# Patient Record
Sex: Male | Born: 1935 | Race: White | Hispanic: No | Smoking: Former smoker
Health system: Southern US, Community
[De-identification: ages and names within clinical notes are randomized; demographics above are authoritative.]

## PROBLEM LIST (undated history)

## (undated) DIAGNOSIS — I350 Nonrheumatic aortic (valve) stenosis: Secondary | ICD-10-CM

## (undated) DIAGNOSIS — I10 Essential (primary) hypertension: Secondary | ICD-10-CM

## (undated) DIAGNOSIS — Z8619 Personal history of other infectious and parasitic diseases: Secondary | ICD-10-CM

## (undated) DIAGNOSIS — G7000 Myasthenia gravis without (acute) exacerbation: Secondary | ICD-10-CM

## (undated) DIAGNOSIS — R740 Nonspecific elevation of levels of transaminase and lactic acid dehydrogenase [LDH]: Secondary | ICD-10-CM

## (undated) DIAGNOSIS — E039 Hypothyroidism, unspecified: Secondary | ICD-10-CM

## (undated) DIAGNOSIS — R7401 Elevation of levels of liver transaminase levels: Secondary | ICD-10-CM

## (undated) DIAGNOSIS — K5900 Constipation, unspecified: Secondary | ICD-10-CM

## (undated) DIAGNOSIS — C61 Malignant neoplasm of prostate: Secondary | ICD-10-CM

## (undated) HISTORY — DX: Personal history of other infectious and parasitic diseases: Z86.19

## (undated) HISTORY — DX: Constipation, unspecified: K59.00

## (undated) HISTORY — DX: Essential (primary) hypertension: I10

## (undated) HISTORY — DX: Malignant neoplasm of prostate: C61

## (undated) HISTORY — DX: Nonspecific elevation of levels of transaminase and lactic acid dehydrogenase (ldh): R74.0

## (undated) HISTORY — DX: Elevation of levels of liver transaminase levels: R74.01

---
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
## 1999-10-23 HISTORY — PX: PROSTATECTOMY: SHX69

## 2004-10-22 HISTORY — PX: HERNIA REPAIR: SHX51

## 2005-04-19 ENCOUNTER — Ambulatory Visit: Payer: Self-pay | Admitting: General Surgery

## 2005-04-19 ENCOUNTER — Other Ambulatory Visit: Payer: Self-pay

## 2005-04-26 ENCOUNTER — Ambulatory Visit: Payer: Self-pay | Admitting: General Surgery

## 2006-04-20 ENCOUNTER — Emergency Department: Payer: Self-pay | Admitting: Emergency Medicine

## 2006-05-30 ENCOUNTER — Other Ambulatory Visit: Payer: Self-pay

## 2006-05-30 ENCOUNTER — Ambulatory Visit: Payer: Self-pay | Admitting: Urology

## 2006-06-13 ENCOUNTER — Ambulatory Visit: Payer: Self-pay | Admitting: Urology

## 2006-06-20 ENCOUNTER — Ambulatory Visit: Payer: Self-pay | Admitting: Urology

## 2006-12-27 ENCOUNTER — Ambulatory Visit: Payer: Self-pay | Admitting: General Surgery

## 2006-12-27 ENCOUNTER — Other Ambulatory Visit: Payer: Self-pay

## 2006-12-27 LAB — HM COLONOSCOPY

## 2007-01-01 ENCOUNTER — Ambulatory Visit: Payer: Self-pay | Admitting: General Surgery

## 2012-08-15 DIAGNOSIS — I080 Rheumatic disorders of both mitral and aortic valves: Secondary | ICD-10-CM | POA: Insufficient documentation

## 2012-08-15 DIAGNOSIS — I10 Essential (primary) hypertension: Secondary | ICD-10-CM | POA: Insufficient documentation

## 2013-10-08 ENCOUNTER — Ambulatory Visit: Payer: Self-pay | Admitting: Family Medicine

## 2013-10-28 ENCOUNTER — Ambulatory Visit: Payer: Self-pay | Admitting: Family Medicine

## 2013-11-18 ENCOUNTER — Ambulatory Visit: Payer: Self-pay | Admitting: Internal Medicine

## 2014-05-07 LAB — BASIC METABOLIC PANEL
BUN: 27 mg/dL — AB (ref 4–21)
CREATININE: 1.8 mg/dL — AB (ref 0.6–1.3)
GLUCOSE: 87 mg/dL
POTASSIUM: 4.1 mmol/L (ref 3.4–5.3)
Sodium: 142 mmol/L (ref 137–147)

## 2014-05-07 LAB — TSH: TSH: 1.66 u[IU]/mL (ref 0.41–5.90)

## 2014-10-27 DIAGNOSIS — N183 Chronic kidney disease, stage 3 (moderate): Secondary | ICD-10-CM | POA: Diagnosis not present

## 2014-10-27 DIAGNOSIS — E039 Hypothyroidism, unspecified: Secondary | ICD-10-CM | POA: Diagnosis not present

## 2014-10-27 DIAGNOSIS — Z0000 Encounter for general adult medical examination without abnormal findings: Secondary | ICD-10-CM | POA: Diagnosis not present

## 2014-10-27 DIAGNOSIS — I10 Essential (primary) hypertension: Secondary | ICD-10-CM | POA: Diagnosis not present

## 2014-10-27 DIAGNOSIS — E785 Hyperlipidemia, unspecified: Secondary | ICD-10-CM | POA: Diagnosis not present

## 2014-10-27 LAB — BASIC METABOLIC PANEL
BUN: 29 mg/dL — AB (ref 4–21)
CREATININE: 1.8 mg/dL — AB (ref 0.6–1.3)
Creatinine: 1.8 mg/dL — AB (ref 0.6–1.3)
GLUCOSE: 89 mg/dL
POTASSIUM: 4.6 mmol/L (ref 3.4–5.3)
Sodium: 140 mmol/L (ref 137–147)

## 2014-10-27 LAB — LIPID PANEL
CHOLESTEROL: 149 mg/dL (ref 0–200)
HDL: 63 mg/dL (ref 35–70)
LDL Cholesterol: 70 mg/dL
Triglycerides: 79 mg/dL (ref 40–160)

## 2014-10-27 LAB — TSH: TSH: 2.09 u[IU]/mL (ref 0.41–5.90)

## 2014-11-02 ENCOUNTER — Ambulatory Visit: Payer: Self-pay | Admitting: Family Medicine

## 2014-11-02 DIAGNOSIS — I7789 Other specified disorders of arteries and arterioles: Secondary | ICD-10-CM | POA: Diagnosis not present

## 2014-11-02 DIAGNOSIS — I6523 Occlusion and stenosis of bilateral carotid arteries: Secondary | ICD-10-CM | POA: Diagnosis not present

## 2014-11-09 DIAGNOSIS — R740 Nonspecific elevation of levels of transaminase and lactic acid dehydrogenase [LDH]: Secondary | ICD-10-CM | POA: Diagnosis not present

## 2014-11-09 LAB — HEPATIC FUNCTION PANEL
ALT: 43 U/L — AB (ref 10–40)
AST: 31 U/L (ref 14–40)

## 2014-12-07 DIAGNOSIS — I350 Nonrheumatic aortic (valve) stenosis: Secondary | ICD-10-CM | POA: Diagnosis not present

## 2014-12-07 DIAGNOSIS — R9431 Abnormal electrocardiogram [ECG] [EKG]: Secondary | ICD-10-CM | POA: Diagnosis not present

## 2014-12-07 DIAGNOSIS — R011 Cardiac murmur, unspecified: Secondary | ICD-10-CM | POA: Diagnosis not present

## 2014-12-07 DIAGNOSIS — R0602 Shortness of breath: Secondary | ICD-10-CM | POA: Diagnosis not present

## 2014-12-14 DIAGNOSIS — R011 Cardiac murmur, unspecified: Secondary | ICD-10-CM | POA: Diagnosis not present

## 2014-12-14 DIAGNOSIS — I350 Nonrheumatic aortic (valve) stenosis: Secondary | ICD-10-CM | POA: Diagnosis not present

## 2014-12-14 DIAGNOSIS — R0602 Shortness of breath: Secondary | ICD-10-CM | POA: Diagnosis not present

## 2014-12-22 DIAGNOSIS — E785 Hyperlipidemia, unspecified: Secondary | ICD-10-CM | POA: Diagnosis not present

## 2014-12-22 DIAGNOSIS — Z1389 Encounter for screening for other disorder: Secondary | ICD-10-CM | POA: Diagnosis not present

## 2014-12-22 DIAGNOSIS — I2510 Atherosclerotic heart disease of native coronary artery without angina pectoris: Secondary | ICD-10-CM | POA: Diagnosis not present

## 2014-12-22 LAB — LIPID PANEL
CHOLESTEROL: 149 mg/dL (ref 0–200)
HDL: 62 mg/dL (ref 35–70)
LDL Cholesterol: 72 mg/dL
TRIGLYCERIDES: 75 mg/dL (ref 40–160)

## 2014-12-22 LAB — HEPATIC FUNCTION PANEL
ALT: 22 U/L (ref 10–40)
AST: 23 U/L (ref 14–40)

## 2015-01-31 DIAGNOSIS — H6123 Impacted cerumen, bilateral: Secondary | ICD-10-CM | POA: Diagnosis not present

## 2015-01-31 DIAGNOSIS — H903 Sensorineural hearing loss, bilateral: Secondary | ICD-10-CM | POA: Diagnosis not present

## 2015-08-26 ENCOUNTER — Other Ambulatory Visit: Payer: Self-pay | Admitting: Family Medicine

## 2015-10-28 ENCOUNTER — Other Ambulatory Visit: Payer: Self-pay | Admitting: Family Medicine

## 2015-10-28 NOTE — Telephone Encounter (Signed)
Patient requesting refill levothyroxine (SYNTHROID, LEVOTHROID) 50 MCG tablet   lisinopril-hydrochlorothiazide (PRINZIDE,ZESTORETIC) 10-12.5 MG tablet   [facility]

## 2015-10-30 MED ORDER — LEVOTHYROXINE SODIUM 50 MCG PO TABS: ORAL_TABLET | ORAL | Status: DC

## 2015-10-30 MED ORDER — LISINOPRIL-HYDROCHLOROTHIAZIDE 10-12.5 MG PO TABS: ORAL_TABLET | ORAL | Status: DC

## 2015-11-03 ENCOUNTER — Encounter: Payer: Self-pay | Admitting: *Deleted

## 2015-11-03 DIAGNOSIS — R0609 Other forms of dyspnea: Secondary | ICD-10-CM

## 2015-11-03 DIAGNOSIS — R7401 Elevation of levels of liver transaminase levels: Secondary | ICD-10-CM | POA: Insufficient documentation

## 2015-11-03 DIAGNOSIS — I10 Essential (primary) hypertension: Secondary | ICD-10-CM

## 2015-11-03 DIAGNOSIS — R740 Nonspecific elevation of levels of transaminase and lactic acid dehydrogenase [LDH]: Secondary | ICD-10-CM

## 2015-11-03 DIAGNOSIS — I080 Rheumatic disorders of both mitral and aortic valves: Secondary | ICD-10-CM

## 2015-11-03 DIAGNOSIS — R1031 Right lower quadrant pain: Secondary | ICD-10-CM | POA: Insufficient documentation

## 2015-11-03 DIAGNOSIS — N183 Chronic kidney disease, stage 3 unspecified: Secondary | ICD-10-CM | POA: Insufficient documentation

## 2015-11-03 DIAGNOSIS — E785 Hyperlipidemia, unspecified: Secondary | ICD-10-CM | POA: Insufficient documentation

## 2015-11-03 DIAGNOSIS — N200 Calculus of kidney: Secondary | ICD-10-CM | POA: Insufficient documentation

## 2015-11-03 DIAGNOSIS — M549 Dorsalgia, unspecified: Secondary | ICD-10-CM | POA: Insufficient documentation

## 2015-11-03 DIAGNOSIS — I2510 Atherosclerotic heart disease of native coronary artery without angina pectoris: Secondary | ICD-10-CM | POA: Insufficient documentation

## 2015-11-03 DIAGNOSIS — R0989 Other specified symptoms and signs involving the circulatory and respiratory systems: Secondary | ICD-10-CM | POA: Insufficient documentation

## 2015-11-03 DIAGNOSIS — Z8619 Personal history of other infectious and parasitic diseases: Secondary | ICD-10-CM | POA: Insufficient documentation

## 2015-11-03 DIAGNOSIS — E039 Hypothyroidism, unspecified: Secondary | ICD-10-CM

## 2015-11-03 DIAGNOSIS — Z8546 Personal history of malignant neoplasm of prostate: Secondary | ICD-10-CM | POA: Insufficient documentation

## 2015-11-04 ENCOUNTER — Encounter: Payer: Self-pay | Admitting: Family Medicine

## 2015-11-04 ENCOUNTER — Ambulatory Visit (INDEPENDENT_AMBULATORY_CARE_PROVIDER_SITE_OTHER): Payer: Medicare Other | Admitting: Family Medicine

## 2015-11-04 VITALS — BP 106/54 | HR 52 | Temp 97.5°F | Resp 16 | Ht 68.0 in | Wt 137.0 lb

## 2015-11-04 DIAGNOSIS — I10 Essential (primary) hypertension: Secondary | ICD-10-CM | POA: Diagnosis not present

## 2015-11-04 DIAGNOSIS — E039 Hypothyroidism, unspecified: Secondary | ICD-10-CM | POA: Diagnosis not present

## 2015-11-04 DIAGNOSIS — I080 Rheumatic disorders of both mitral and aortic valves: Secondary | ICD-10-CM

## 2015-11-04 DIAGNOSIS — N183 Chronic kidney disease, stage 3 unspecified: Secondary | ICD-10-CM

## 2015-11-04 DIAGNOSIS — I2510 Atherosclerotic heart disease of native coronary artery without angina pectoris: Secondary | ICD-10-CM | POA: Diagnosis not present

## 2015-11-04 DIAGNOSIS — Z8546 Personal history of malignant neoplasm of prostate: Secondary | ICD-10-CM

## 2015-11-04 DIAGNOSIS — E785 Hyperlipidemia, unspecified: Secondary | ICD-10-CM

## 2015-11-04 DIAGNOSIS — Z0000 Encounter for general adult medical examination without abnormal findings: Secondary | ICD-10-CM

## 2015-11-04 NOTE — Progress Notes (Signed)
Patient: Jason Riley, Male    DOB: [birth year], 82 y.o.   MRN: [id number] Visit Date: 11/04/2015  Today's Provider: Lelon Huh, MD   Chief Complaint  Patient presents with  . Annual Exam  . Hypertension    follow up  . Hyperlipidemia    follow up  . Hypothyroidism    follow up  . Chronic Kidney Disease    follow up   Subjective:    Annual physical  Jason Riley is a 82 y.o. male. He feels fairly well. He reports exercising several times a week walking. He reports he is sleeping fairly well.  -----------------------------------------------------------  Hypertension, follow-up:  BP Readings from Last 3 Encounters:  12/22/14 122/60    He was last seen for hypertension 1 years ago.  BP at that visit was 132/72. Management since that visit includes no changes. He reports good compliance with treatment. He is not having side effects.  He is exercising. He is not adherent to low salt diet.   Outside blood pressures are nor being checked. He is experiencing none.  Patient denies chest pain, chest pressure/discomfort, claudication, dyspnea, exertional chest pressure/discomfort, fatigue, irregular heart beat, lower extremity edema, near-syncope, orthopnea, palpitations, paroxysmal nocturnal dyspnea, syncope and tachypnea.   Cardiovascular risk factors include advanced age (older than 29 for men, 64 for women), dyslipidemia, hypertension and male gender.  Use of agents associated with hypertension: NSAIDS.     Weight trend: stable Wt Readings from Last 3 Encounters:  12/22/14 139 lb (63.05 kg)    Current diet: in general, an "unhealthy" diet  ------------------------------------------------------------------------   Lipid/Cholesterol, Follow-up:   Last seen for this 10 months ago.  Management changes since that visit include none; patient was to continue Crestor. . Last Lipid Panel:    Component Value Date/Time   CHOL 149 12/22/2014   TRIG 75 12/22/2014    HDL 62 12/22/2014   LDLCALC 72 12/22/2014    Risk factors for vascular disease include hypercholesterolemia and hypertension  He reports good compliance with treatment. He is not having side effects.  Current symptoms include none and have been stable. Weight trend: stable Prior visit with dietician: no Current diet: in general, an "unhealthy" diet Current exercise: walking  Wt Readings from Last 3 Encounters:  12/22/14 139 lb (63.05 kg)    ------------------------------------------------------------------- Follow up Hypothyroidism:  Last office visit was 1 year ago and no changes were made. Current treatment includes Levothyroxine 48mcg daily. Patient reports good compliance with treatment and good tolerance.   Follow up CKD:  Last office visit was 1 year ago and no changes were made.   Review of Systems  Constitutional: Negative for fever, chills, appetite change and fatigue.  HENT: Positive for hearing loss. Negative for congestion, ear pain, nosebleeds and trouble swallowing.   Eyes: Negative for pain and visual disturbance.  Respiratory: Negative for cough, chest tightness and shortness of breath.   Cardiovascular: Negative for chest pain, palpitations and leg swelling.  Gastrointestinal: Negative for nausea, vomiting, abdominal pain, diarrhea, constipation and blood in stool.  Endocrine: Negative for polydipsia, polyphagia and polyuria.  Genitourinary: Negative for dysuria and flank pain.  Musculoskeletal: Negative for myalgias, back pain, joint swelling, arthralgias and neck stiffness.  Skin: Negative for color change, rash and wound.  Neurological: Negative for dizziness, tremors, seizures, speech difficulty, weakness, light-headedness and headaches.  Psychiatric/Behavioral: Negative for behavioral problems, confusion, sleep disturbance, dysphoric mood and decreased concentration. The patient is not nervous/anxious.  All other systems reviewed and are  negative.   Social History   Social History  . Marital Status: Married    Spouse Name: N/A  . Number of Children: 1  . Years of Education: Some Coll   Occupational History  . Retired    Social History Main Topics  . Smoking status: Former Smoker -- 10 years    Types: Cigars  . Smokeless tobacco: Current User    Types: Chew  . Alcohol Use: No  . Drug Use: No  . Sexual Activity: Not on file   Other Topics Concern  . Not on file   Social History Narrative    Past Medical History  Diagnosis Date  . Hyperlipidemia   . Hypertension   . Prostate disease   . Kidney disease   . History of measles   . Hypothyroid   . Aortic stenosis with mitral and aortic insufficiency   . Coronary artery disease involving native heart   . Back pain   . CKD (chronic kidney disease), stage III   . DOE (dyspnea on exertion)   . Carotid bruit   . Right inguinal pain   . Elevated transaminase level      Patient Active Problem List   Diagnosis Date Noted  . Back pain 11/03/2015  . Carotid bruit 11/03/2015  . History of measles 11/03/2015  . CKD (chronic kidney disease), stage III 11/03/2015  . Coronary artery disease involving native heart 11/03/2015  . DOE (dyspnea on exertion) 11/03/2015  . Elevated transaminase level 11/03/2015  . Hyperlipidemia 11/03/2015  . Hypothyroid 11/03/2015  . Kidney stone 11/03/2015  . Prostate disease 11/03/2015  . Right inguinal pain 11/03/2015  . Aortic stenosis with mitral and aortic insufficiency 08/15/2012  . Hypertension 08/15/2012    Past Surgical History  Procedure Laterality Date  . Appendectomy  1955  . Hernia repair Right 2006    Dr. Jamal Collin  . Prostatectomy  2001    Dr. Yves Dill    His family history includes Colon cancer in his father; Heart disease in his mother; Obesity in his sister.    Previous Medications   ASPIRIN (ASPIRIN LOW DOSE) 81 MG EC TABLET    Take 1 tablet by mouth daily.   LEVOTHYROXINE (SYNTHROID, LEVOTHROID) 50 MCG  TABLET    Take 1 tablet by mouth  daily   LISINOPRIL-HYDROCHLOROTHIAZIDE (PRINZIDE,ZESTORETIC) 10-12.5 MG TABLET    Take 1 tablet by mouth  daily   ROSUVASTATIN (CRESTOR) 20 MG TABLET    Take 1 tablet by mouth daily.    Patient Care Team: [name], MD as PCP - General (Family Medicine)     Objective:   Vitals: BP 106/54 mmHg  Pulse 52  Temp(Src) 97.5 F (36.4 C) (Oral)  Resp 16  Ht 5\' 8"  (1.727 m)  Wt 137 lb (62.143 kg)  BMI 20.84 kg/m2  SpO2 99%  Physical Exam   General Appearance:    Alert, cooperative, no distress, appears stated age  Head:    Normocephalic, without obvious abnormality, atraumatic  Eyes:    PERRL, conjunctiva/corneas clear, EOM's intact, fundi    benign, both eyes       Ears:    Normal TM's and external ear canals, both ears  Nose:   Nares normal, septum midline, mucosa normal, no drainage   or sinus tenderness  Throat:   Lips, mucosa, and tongue normal; teeth and gums normal  Neck:   Supple, symmetrical, trachea midline, no adenopathy;  thyroid:  No enlargement/tenderness/nodules; no carotid   bruit or JVD  Back:     Symmetric, no curvature, ROM normal, no CVA tenderness  Lungs:     Clear to auscultation bilaterally, respirations unlabored  Chest wall:    No tenderness or deformity  Heart:    Regular rate and rhythm, S1 and S2 normal, III/VI murmur RUSB  Abdomen:     Soft, non-tender, bowel sounds active all four quadrants,    no masses, no organomegaly  Genitalia:    deferred  Rectal:    deferred  Extremities:   Extremities normal, atraumatic, no cyanosis or edema  Pulses:   2+ and symmetric all extremities  Skin:   Skin color, texture, turgor normal, no rashes or lesions  Lymph nodes:   Cervical, supraclavicular, and axillary nodes normal  Neurologic:   CNII-XII intact. Normal strength, sensation and reflexes      throughout    Activities of Daily Living In your present state of health, do you have any difficulty performing the  following activities: 11/04/2015  Hearing? Y  Vision? N  Difficulty concentrating or making decisions? N  Walking or climbing stairs? N  Dressing or bathing? N  Doing errands, shopping? N    Fall Risk Assessment Fall Risk  11/04/2015  Falls in the past year? No     Depression Screen PHQ 2/9 Scores 11/04/2015  PHQ - 2 Score 0    Cognitive Testing - 6-CIT  Correct? Score   What year is it? yes 0 0 or 4  What month is it? yes 0 0 or 3  Memorize:    Pia Mau,  44,  [address]  [place],      What time is it? (within 1 hour) yes 0 0 or 3  Count backwards from 20 yes 0 0, 2, or 4  Name the months of the year yes 0 0, 2, or 4  Repeat name & address above yes 0 0, 2, 4, 6, 8, or 10       TOTAL SCORE  0/28   Interpretation:  Normal  Normal (0-7) Abnormal (8-28)    Audit-C Alcohol Use Screening  Question Answer Points  How often do you have alcoholic drink? never 0  On days you do drink alcohol, how many drinks do you typically consume? 0 0  How oftey will you drink 6 or more in a total? never 0  Total Score:  0   A score of 3 or more in women, and 4 or more in men indicates increased risk for alcohol abuse, EXCEPT if all of the points are from question 1.    Assessment & Plan:    Annual Physical Reviewed patient's Family Medical History Reviewed and updated list of patient's medical providers Assessment of cognitive impairment was done Assessed patient's functional ability Established a written schedule for health screening [facility] Completed and Reviewed  Exercise Activities and Dietary recommendations Goals    None      Immunization History  Administered Date(s) Administered  . Influenza, High Dose Seasonal PF 08/12/2015  . Pneumococcal Conjugate-13 08/17/2013    Health Maintenance  Topic Date Due  . TETANUS/TDAP  12/27/1949  . ZOSTAVAX  12/28/1990  . PNA vac Low Risk Adult (2 of 2 - PPSV23) 08/17/2014  . INFLUENZA VACCINE   05/22/2016      Discussed health benefits of physical activity, and encouraged him to engage in regular exercise appropriate for his age and condition.    ------------------------------------------------------------------------------------------------------------  1. Annual physical exam Generally doing well.   2. Aortic stenosis with mitral and aortic insufficiency Asymptomatic. Compliant with medication.  Continue aggressive risk factor modification.    3. CKD (chronic kidney disease), stage III  - Renal function panel  4. Hypothyroidism, unspecified hypothyroidism type Doing well on current dose of levothroxine with no adverse effects. Continue current medication.   5. Essential hypertension Well controlled.  Continue current medications.   - TSH  6. Hyperlipidemia He is tolerating rosuvastatin well with no adverse effects.   - Lipid panel  7. Coronary artery disease involving native coronary artery of native heart without angina pectoris Asymptomatic. Compliant with medication.  Continue aggressive risk factor modification.

## 2015-11-05 LAB — RENAL FUNCTION PANEL
ALBUMIN: 4.2 g/dL (ref 3.5–4.7)
BUN/Creatinine Ratio: 14 (ref 10–22)
BUN: 29 mg/dL — AB (ref 8–27)
CO2: 24 mmol/L (ref 18–29)
CREATININE: 2.06 mg/dL — AB (ref 0.76–1.27)
Calcium: 9.8 mg/dL (ref 8.6–10.2)
Chloride: 105 mmol/L (ref 96–106)
GFR calc Af Amer: 33 mL/min/{1.73_m2} — ABNORMAL LOW (ref >59)
GFR, EST NON AFRICAN AMERICAN: 29 mL/min/{1.73_m2} — AB (ref >59)
Glucose: 94 mg/dL (ref 65–99)
PHOSPHORUS: 3.3 mg/dL (ref 2.5–4.5)
POTASSIUM: 4.3 mmol/L (ref 3.5–5.2)
Sodium: 144 mmol/L (ref 134–144)

## 2015-11-05 LAB — LIPID PANEL
CHOLESTEROL TOTAL: 131 mg/dL (ref 100–199)
Chol/HDL Ratio: 2.3 ratio units (ref 0.0–5.0)
HDL: 56 mg/dL (ref >39)
LDL Calculated: 62 mg/dL (ref 0–99)
TRIGLYCERIDES: 65 mg/dL (ref 0–149)
VLDL CHOLESTEROL CAL: 13 mg/dL (ref 5–40)

## 2015-11-05 LAB — TSH: TSH: 1.4 u[IU]/mL (ref 0.450–4.500)

## 2015-11-08 ENCOUNTER — Encounter: Payer: Self-pay | Admitting: Family Medicine

## 2015-12-12 DIAGNOSIS — E079 Disorder of thyroid, unspecified: Secondary | ICD-10-CM | POA: Diagnosis not present

## 2015-12-12 DIAGNOSIS — R0602 Shortness of breath: Secondary | ICD-10-CM | POA: Diagnosis not present

## 2015-12-12 DIAGNOSIS — I350 Nonrheumatic aortic (valve) stenosis: Secondary | ICD-10-CM | POA: Diagnosis not present

## 2015-12-12 DIAGNOSIS — I10 Essential (primary) hypertension: Secondary | ICD-10-CM | POA: Diagnosis not present

## 2015-12-12 DIAGNOSIS — E785 Hyperlipidemia, unspecified: Secondary | ICD-10-CM | POA: Diagnosis not present

## 2015-12-18 ENCOUNTER — Other Ambulatory Visit: Payer: Self-pay | Admitting: Family Medicine

## 2016-01-02 DIAGNOSIS — H903 Sensorineural hearing loss, bilateral: Secondary | ICD-10-CM | POA: Diagnosis not present

## 2016-01-02 DIAGNOSIS — H6123 Impacted cerumen, bilateral: Secondary | ICD-10-CM | POA: Diagnosis not present

## 2016-02-21 ENCOUNTER — Other Ambulatory Visit: Payer: Self-pay | Admitting: Family Medicine

## 2016-06-11 DIAGNOSIS — I10 Essential (primary) hypertension: Secondary | ICD-10-CM | POA: Diagnosis not present

## 2016-06-11 DIAGNOSIS — R0602 Shortness of breath: Secondary | ICD-10-CM | POA: Diagnosis not present

## 2016-06-11 DIAGNOSIS — E785 Hyperlipidemia, unspecified: Secondary | ICD-10-CM | POA: Diagnosis not present

## 2016-06-11 DIAGNOSIS — E079 Disorder of thyroid, unspecified: Secondary | ICD-10-CM | POA: Diagnosis not present

## 2016-06-11 DIAGNOSIS — I350 Nonrheumatic aortic (valve) stenosis: Secondary | ICD-10-CM | POA: Diagnosis not present

## 2016-07-04 DIAGNOSIS — H903 Sensorineural hearing loss, bilateral: Secondary | ICD-10-CM | POA: Diagnosis not present

## 2016-07-04 DIAGNOSIS — H6123 Impacted cerumen, bilateral: Secondary | ICD-10-CM | POA: Diagnosis not present

## 2016-11-06 ENCOUNTER — Ambulatory Visit (INDEPENDENT_AMBULATORY_CARE_PROVIDER_SITE_OTHER): Payer: Medicare Other | Admitting: Family Medicine

## 2016-11-06 ENCOUNTER — Ambulatory Visit (INDEPENDENT_AMBULATORY_CARE_PROVIDER_SITE_OTHER): Payer: Medicare Other

## 2016-11-06 VITALS — BP 129/63 | HR 68 | Temp 97.8°F | Ht 68.0 in | Wt 145.0 lb

## 2016-11-06 DIAGNOSIS — N183 Chronic kidney disease, stage 3 unspecified: Secondary | ICD-10-CM

## 2016-11-06 DIAGNOSIS — I2510 Atherosclerotic heart disease of native coronary artery without angina pectoris: Secondary | ICD-10-CM | POA: Diagnosis not present

## 2016-11-06 DIAGNOSIS — Z0000 Encounter for general adult medical examination without abnormal findings: Secondary | ICD-10-CM

## 2016-11-06 DIAGNOSIS — L989 Disorder of the skin and subcutaneous tissue, unspecified: Secondary | ICD-10-CM | POA: Diagnosis not present

## 2016-11-06 DIAGNOSIS — E785 Hyperlipidemia, unspecified: Secondary | ICD-10-CM

## 2016-11-06 DIAGNOSIS — E039 Hypothyroidism, unspecified: Secondary | ICD-10-CM | POA: Diagnosis not present

## 2016-11-06 DIAGNOSIS — I10 Essential (primary) hypertension: Secondary | ICD-10-CM

## 2016-11-06 NOTE — Progress Notes (Signed)
Patient: Jason Riley Male    DOB: Oct 25, 1928   83 y.o.   MRN: [id number] Visit Date: 11/06/2016  Today's Provider: Lelon Huh, MD   Chief Complaint  Patient presents with  . Follow-up  . Hypertension  . Hypothyroidism  . Hyperlipidemia  . Chronic Kidney Disease   Subjective:    HPI He is here for follow up of multiple chronic conditions. Had ASV with nurse health advisor earlier today.   CKD (chronic kidney disease), stage III From 11/04/2015-no changes.  Hypothyroidism, unspecified hypothyroidism type From 11/04/2015-no changes. Lab Results  Component Value Date   TSH 1.400 11/04/2015       Hypertension, follow-up:  BP Readings from Last 3 Encounters:  11/06/16 129/63  11/04/15 (!) 106/54  12/22/14 122/60    He was last seen for hypertension 1 years ago.  BP at that visit was 106/54. Management since that visit includes; no changes.He reports good compliance with treatment. He is not having side effects. none He is not exercising. He is adherent to low salt diet.   Outside blood pressures are not checking. He is experiencing none.  Patient denies none.   Cardiovascular risk factors include none.  Use of agents associated with hypertension: none.   ----------------------------------------------------------------    Lipid/Cholesterol, Follow-up:   Last seen for this 1 years ago.  Management since that visit includes; no changes.  Last Lipid Panel:    Component Value Date/Time   CHOL 131 11/04/2015 1055   TRIG 65 11/04/2015 1055   HDL 56 11/04/2015 1055   CHOLHDL 2.3 11/04/2015 1055   LDLCALC 62 11/04/2015 1055    He reports good compliance with treatment. He is not having side effects. none  Wt Readings from Last 3 Encounters:  11/06/16 145 lb (65.8 kg)  11/04/15 137 lb (62.1 kg)  12/22/14 139 lb (63 kg)    ---------------------------------------------------------------- CAD: Continues regular follow up with Dr. Clayborn Bigness. No  recent chest pains, dyspnea, or palpitations.    Allergies  Allergen Reactions  . Atorvastatin     Elevated liver functions  . Sulfa Antibiotics      Current Outpatient Prescriptions:  .  aspirin (ASPIRIN LOW DOSE) 81 MG EC tablet, Take 1 tablet by mouth daily., Disp: , Rfl:  .  docusate sodium (STOOL SOFTENER) 100 MG capsule, Take 100 mg by mouth daily., Disp: , Rfl:  .  levothyroxine (SYNTHROID, LEVOTHROID) 50 MCG tablet, Take 1 tablet by mouth  daily, Disp: 90 tablet, Rfl: 3 .  lisinopril-hydrochlorothiazide (PRINZIDE,ZESTORETIC) 10-12.5 MG tablet, Take 1 tablet by mouth  daily, Disp: 90 tablet, Rfl: 3 .  rosuvastatin (CRESTOR) 20 MG tablet, Take 1 tablet by mouth  daily, Disp: 90 tablet, Rfl: 4  Review of Systems  Constitutional: Negative for appetite change, chills and fever.  Respiratory: Negative for chest tightness, shortness of breath and wheezing.   Cardiovascular: Negative for chest pain and palpitations.  Gastrointestinal: Negative for abdominal pain, nausea and vomiting.    Social History  Substance Use Topics  . Smoking status: Former Smoker    Years: 10.00    Types: Cigars  . Smokeless tobacco: Current User    Types: Chew  . Alcohol use No   Objective:    Vitals: BP 129/63 (BP Location: Right Arm)   Pulse 68   Temp 97.8 F (36.6 C) (Oral)   Ht 5\' 8"  (1.727 m)   Wt 145 lb (65.8 kg)   BMI 22.05 kg/m   Body  mass index is 22.05 kg/m.  Physical Exam   General Appearance:    Alert, cooperative, no distress  Eyes:    PERRL, conjunctiva/corneas clear, EOM's intact       Lungs:     Clear to auscultation bilaterally, respirations unlabored  Heart:    Regular rate and rhythm  Neurologic:   Awake, alert, oriented x 3. No apparent focal neurological           defect.   Skin:   Scattered hyperkeratotic lesions across both forearms. Several scabby pigmented lesions across back c/w SKs.        Assessment & Plan:      1. Coronary artery disease involving  native coronary artery of native heart without angina pectoris Asymptomatic. Compliant with medication.  Continue aggressive risk factor modification.    2. Essential hypertension Well controlled.  Continue current medications.   - Comprehensive metabolic panel  3. Hypothyroidism, unspecified type  - TSH  4. CKD (chronic kidney disease), stage III  - Comprehensive metabolic panel  5. Hyperlipidemia, unspecified hyperlipidemia type He is tolerating rosuvastatin well with no adverse effects.   - Lipid panel - Comprehensive metabolic panel  6. Skin lesions  - Ambulatory referral to Dermatology       Lelon Huh, MD  [facility]

## 2016-11-06 NOTE — Patient Instructions (Signed)

## 2016-11-06 NOTE — Progress Notes (Signed)
Subjective:   Jason Riley is a 83 y.o. male who presents for Medicare Annual/Subsequent preventive examination.  Review of Systems:  N/A  Cardiac Risk Factors include: advanced age (>17men, >71 women);dyslipidemia;hypertension;male gender;smoking/ tobacco exposure     Objective:    Vitals: BP 129/63 (BP Location: Right Arm)   Pulse 68   Temp 97.8 F (36.6 C) (Oral)   Ht 5\' 8"  (1.727 m)   Wt 145 lb (65.8 kg)   BMI 22.05 kg/m   Body mass index is 22.05 kg/m.  Tobacco History  Smoking Status  . Former Smoker  . Years: 10.00  . Types: Cigars  Smokeless Tobacco  . Current User  . Types: Chew     Ready to quit: Not Answered Counseling given: Not Answered   Past Medical History:  Diagnosis Date  . CN (constipation)   . Elevated transaminase level   . History of measles   . Hyperlipidemia   . Hypertension   . Prostate cancer [facility])    Prostatectomy 2001   Past Surgical History:  Procedure Laterality Date  . APPENDECTOMY  1955  . HERNIA REPAIR Right 2006   Dr. Jamal Collin  . PROSTATECTOMY  2001   Dr. Yves Dill   Family History  Problem Relation Age of Onset  . Heart disease Mother   . Colon cancer Father   . Obesity Sister    History  Sexual Activity  . Sexual activity: Not on file    Outpatient Encounter Prescriptions as of 11/06/2016  Medication Sig  . aspirin (ASPIRIN LOW DOSE) 81 MG EC tablet Take 1 tablet by mouth daily.  Marland Kitchen docusate sodium (STOOL SOFTENER) 100 MG capsule Take 100 mg by mouth daily.  Marland Kitchen levothyroxine (SYNTHROID, LEVOTHROID) 50 MCG tablet Take 1 tablet by mouth  daily  . lisinopril-hydrochlorothiazide (PRINZIDE,ZESTORETIC) 10-12.5 MG tablet Take 1 tablet by mouth  daily  . rosuvastatin (CRESTOR) 20 MG tablet Take 1 tablet by mouth  daily   No facility-administered encounter medications on file as of 11/06/2016.     Activities of Daily Living In your present state of health, do you have any difficulty performing the following activities:  11/06/2016  Hearing? Y  Vision? N  Difficulty concentrating or making decisions? N  Walking or climbing stairs? Y  Dressing or bathing? N  Doing errands, shopping? N  Preparing Food and eating ? N  Using the Toilet? N  In the past six months, have you accidently leaked urine? Y  Do you have problems with loss of bowel control? N  Managing your Medications? N  Managing your Finances? N  Housekeeping or managing your Housekeeping? N  Some recent data might be hidden    Patient Care Team: [name], MD as PCP - General (Family Medicine) Yolonda Kida, MD as Consulting Physician (Cardiology)   Assessment:     Exercise Activities and Dietary recommendations Current Exercise Habits: The patient does not participate in regular exercise at present (only yardwork), Exercise limited by: None identified  Goals    . Exercise 150 minutes per week (moderate activity)          Starting 11/06/16, I will start walking 3 days a week for at least 20 minutes.       Fall Risk Fall Risk  11/06/2016 11/04/2015  Falls in the past year? No No   Depression Screen PHQ 2/9 Scores 11/06/2016 11/04/2015  PHQ - 2 Score 1 0    Cognitive Function     6CIT  Screen 11/06/2016  What Year? 0 points  What month? 0 points  What time? 0 points  Count back from 20 0 points  Months in reverse 2 points  Repeat phrase 0 points  Total Score 2    Immunization History  Administered Date(s) Administered  . Influenza, High Dose Seasonal PF 08/12/2015  . Pneumococcal Conjugate-13 08/17/2013   Screening Tests Health Maintenance  Topic Date Due  . TETANUS/TDAP  12/27/1949  . PNA vac Low Risk Adult (2 of 2 - PPSV23) 10/22/2017 (Originally 08/17/2014)  . ZOSTAVAX  10/22/2026 (Originally 12/28/1990)  . INFLUENZA VACCINE  Completed      Plan:  I have personally reviewed and addressed the Medicare Annual Wellness questionnaire and have noted the following in the patient's chart:  A. Medical and social  history B. Use of alcohol, tobacco or illicit drugs  C. Current medications and supplements D. Functional ability and status E.  Nutritional status F.  Physical activity G. Advance directives H. List of other physicians I.  Hospitalizations, surgeries, and ER visits in previous 12 months J.  [facility] such as hearing and vision if needed, cognitive and depression L. Referrals and appointments - none  In addition, I have reviewed and discussed with patient certain preventive protocols, quality metrics, and best practice recommendations. A written personalized care plan for preventive services as well as general preventive health recommendations were provided to patient.  See attached scanned questionnaire for additional information.   Signed,  Fabio Neighbors, LPN Nurse Health Advisor   MD Recommendations: None.  I have reviewed the health advisor's note, was available for consultation, and agree with documentation and plan  Lelon Huh, MD

## 2016-11-07 LAB — COMPREHENSIVE METABOLIC PANEL
ALK PHOS: 72 IU/L (ref 39–117)
ALT: 16 IU/L (ref 0–44)
AST: 21 IU/L (ref 0–40)
Albumin/Globulin Ratio: 1.8 (ref 1.2–2.2)
Albumin: 4.6 g/dL (ref 3.5–4.7)
BUN/Creatinine Ratio: 14 (ref 10–24)
BUN: 26 mg/dL (ref 8–27)
Bilirubin Total: 0.4 mg/dL (ref 0.0–1.2)
CHLORIDE: 104 mmol/L (ref 96–106)
CO2: 23 mmol/L (ref 18–29)
CREATININE: 1.86 mg/dL — AB (ref 0.76–1.27)
Calcium: 9.8 mg/dL (ref 8.6–10.2)
GFR calc Af Amer: 37 mL/min/{1.73_m2} — ABNORMAL LOW (ref >59)
GFR calc non Af Amer: 32 mL/min/{1.73_m2} — ABNORMAL LOW (ref >59)
GLUCOSE: 94 mg/dL (ref 65–99)
Globulin, Total: 2.6 g/dL (ref 1.5–4.5)
Potassium: 4.6 mmol/L (ref 3.5–5.2)
SODIUM: 143 mmol/L (ref 134–144)
Total Protein: 7.2 g/dL (ref 6.0–8.5)

## 2016-11-07 LAB — LIPID PANEL
CHOLESTEROL TOTAL: 136 mg/dL (ref 100–199)
Chol/HDL Ratio: 2.6 ratio units (ref 0.0–5.0)
HDL: 53 mg/dL (ref >39)
LDL CALC: 60 mg/dL (ref 0–99)
Triglycerides: 116 mg/dL (ref 0–149)
VLDL CHOLESTEROL CAL: 23 mg/dL (ref 5–40)

## 2016-11-07 LAB — TSH: TSH: 2.46 u[IU]/mL (ref 0.450–4.500)

## 2016-11-09 ENCOUNTER — Telehealth: Payer: Self-pay

## 2016-11-09 NOTE — Telephone Encounter (Signed)
-----   Message from [name], MD sent at 11/07/2016  8:24 PM EST ----- Labs are good. Cholesterol well controlled at 136. Continue current medications.  Check labs yearly.

## 2016-11-09 NOTE — Telephone Encounter (Signed)
Left message to call back  

## 2016-11-15 NOTE — Telephone Encounter (Signed)
Patient was notified of results. Expressed understanding.  

## 2016-12-04 DIAGNOSIS — R0602 Shortness of breath: Secondary | ICD-10-CM | POA: Diagnosis not present

## 2016-12-04 DIAGNOSIS — E079 Disorder of thyroid, unspecified: Secondary | ICD-10-CM | POA: Diagnosis not present

## 2016-12-04 DIAGNOSIS — I350 Nonrheumatic aortic (valve) stenosis: Secondary | ICD-10-CM | POA: Diagnosis not present

## 2016-12-04 DIAGNOSIS — R011 Cardiac murmur, unspecified: Secondary | ICD-10-CM | POA: Diagnosis not present

## 2016-12-04 DIAGNOSIS — I10 Essential (primary) hypertension: Secondary | ICD-10-CM | POA: Diagnosis not present

## 2016-12-05 DIAGNOSIS — H903 Sensorineural hearing loss, bilateral: Secondary | ICD-10-CM | POA: Diagnosis not present

## 2016-12-05 DIAGNOSIS — H6123 Impacted cerumen, bilateral: Secondary | ICD-10-CM | POA: Diagnosis not present

## 2016-12-20 DIAGNOSIS — I350 Nonrheumatic aortic (valve) stenosis: Secondary | ICD-10-CM | POA: Diagnosis not present

## 2016-12-20 DIAGNOSIS — R011 Cardiac murmur, unspecified: Secondary | ICD-10-CM | POA: Diagnosis not present

## 2017-03-11 ENCOUNTER — Other Ambulatory Visit: Payer: Self-pay | Admitting: Family Medicine

## 2017-03-11 MED ORDER — LEVOTHYROXINE SODIUM 50 MCG PO TABS: 50.0000 ug | ORAL_TABLET | Freq: Every day | ORAL | 3 refills | Status: DC

## 2017-03-11 MED ORDER — ROSUVASTATIN CALCIUM 20 MG PO TABS: 20.0000 mg | ORAL_TABLET | Freq: Every day | ORAL | 3 refills | Status: DC

## 2017-03-11 MED ORDER — LISINOPRIL-HYDROCHLOROTHIAZIDE 10-12.5 MG PO TABS: 1.0000 | ORAL_TABLET | Freq: Every day | ORAL | 3 refills | Status: DC

## 2017-03-11 NOTE — Telephone Encounter (Signed)
Pt needs refill on   levothyroxine (SYNTHROID, LEVOTHROID) 50 MCG tablet  rosuvastatin (CRESTOR) 20 MG tablet  lisinopril-hydrochlorothiazide (PRINZIDE,ZESTORETIC) 10-12.5 MG tablet  [facility]  Thanks [employer]

## 2017-03-11 NOTE — Telephone Encounter (Signed)
LOV 11/06/2016. Renaldo Fiddler, CMA

## 2017-05-30 DIAGNOSIS — H6123 Impacted cerumen, bilateral: Secondary | ICD-10-CM | POA: Diagnosis not present

## 2017-11-12 ENCOUNTER — Ambulatory Visit (INDEPENDENT_AMBULATORY_CARE_PROVIDER_SITE_OTHER): Payer: Medicare Other

## 2017-11-12 VITALS — BP 114/50 | HR 68 | Temp 97.5°F | Ht 68.0 in | Wt 142.6 lb

## 2017-11-12 DIAGNOSIS — Z0000 Encounter for general adult medical examination without abnormal findings: Secondary | ICD-10-CM | POA: Diagnosis not present

## 2017-11-12 NOTE — Patient Instructions (Signed)
Jason Riley , Thank you for taking time to come for your Medicare Wellness Visit. I appreciate your ongoing commitment to your health goals. Please review the following plan we discussed and let me know if I can assist you in the future.   Screening recommendations/referrals: Colonoscopy: Up to date Recommended yearly ophthalmology/optometry visit for glaucoma screening and checkup Recommended yearly dental visit for hygiene and checkup  Vaccinations: Influenza vaccine: Up to date Pneumococcal vaccine: Prevnar completed 07/2013. Pt states he has had the Pneumovax at his pharmacy. Will contact to receive records.  Tdap vaccine: Pt declines today.  Shingles vaccine: Pt declines today.     Advanced directives: Please bring a copy of your POA (Power of Attorney) and/or Living Will to your next appointment.   Conditions/risks identified: Recommend to quit chewing tobacco. Pt plans to taper down on amount chewed, until completely quits.   Next appointment: 11/20/17 @ 2:00 PM  Preventive Care 65 Years and Older, Male Preventive care refers to lifestyle choices and visits with your health care provider that can promote health and wellness. What does preventive care include?  A yearly physical exam. This is also called an annual well check.  Dental exams once or twice a year.  Routine eye exams. Ask your health care provider how often you should have your eyes checked.  Personal lifestyle choices, including:  Daily care of your teeth and gums.  Regular physical activity.  Eating a healthy diet.  Avoiding tobacco and drug use.  Limiting alcohol use.  Practicing safe sex.  Taking low doses of aspirin every day.  Taking vitamin and mineral supplements as recommended by your health care provider. What happens during an annual well check? The services and screenings done by your health care provider during your annual well check will depend on your age, overall health, lifestyle risk  factors, and family history of disease. Counseling  Your health care provider may ask you questions about your:  Alcohol use.  Tobacco use.  Drug use.  Emotional well-being.  Home and relationship well-being.  Sexual activity.  Eating habits.  History of falls.  Memory and ability to understand (cognition).  Work and work Statistician. Screening  You may have the following tests or measurements:  Height, weight, and BMI.  Blood pressure.  Lipid and cholesterol levels. These may be checked every 5 years, or more frequently if you are over 61 years old.  Skin check.  Lung cancer screening. You may have this screening every year starting at age 21 if you have a 30-pack-year history of smoking and currently smoke or have quit within the past 15 years.  Fecal occult blood test (FOBT) of the stool. You may have this test every year starting at age 39.  Flexible sigmoidoscopy or colonoscopy. You may have a sigmoidoscopy every 5 years or a colonoscopy every 10 years starting at age 73.  Prostate cancer screening. Recommendations will vary depending on your family history and other risks.  Hepatitis C blood test.  Hepatitis B blood test.  Sexually transmitted disease (STD) testing.  Diabetes screening. This is done by checking your blood sugar (glucose) after you have not eaten for a while (fasting). You may have this done every 1-3 years.  Abdominal aortic aneurysm (AAA) screening. You may need this if you are a current or former smoker.  Osteoporosis. You may be screened starting at age 6 if you are at high risk. Talk with your health care provider about your test results, treatment options,  and if necessary, the need for more tests. Vaccines  Your health care provider may recommend certain vaccines, such as:  Influenza vaccine. This is recommended every year.  Tetanus, diphtheria, and acellular pertussis (Tdap, Td) vaccine. You may need a Td booster every 10  years.  Zoster vaccine. You may need this after age 14.  Pneumococcal 13-valent conjugate (PCV13) vaccine. One dose is recommended after age 1.  Pneumococcal polysaccharide (PPSV23) vaccine. One dose is recommended after age 46. Talk to your health care provider about which screenings and vaccines you need and how often you need them. This information is not intended to replace advice given to you by your health care provider. Make sure you discuss any questions you have with your health care provider. Document Released: 11/04/2015 Document Revised: 06/27/2016 Document Reviewed: 08/09/2015 Elsevier Interactive Patient Education  2017 [place] Prevention in the Home Falls can cause injuries. They can happen to people of all ages. There are many things you can do to make your home safe and to help prevent falls. What can I do on the outside of my home?  Regularly fix the edges of walkways and driveways and fix any cracks.  Remove anything that might make you trip as you walk through a door, such as a raised step or threshold.  Trim any bushes or trees on the path to your home.  Use bright outdoor lighting.  Clear any walking paths of anything that might make someone trip, such as rocks or tools.  Regularly check to see if handrails are loose or broken. Make sure that both sides of any steps have handrails.  Any raised decks and porches should have guardrails on the edges.  Have any leaves, snow, or ice cleared regularly.  Use sand or salt on walking paths during winter.  Clean up any spills in your garage right away. This includes oil or grease spills. What can I do in the bathroom?  Use night lights.  Install grab bars by the toilet and in the tub and shower. Do not use towel bars as grab bars.  Use non-skid mats or decals in the tub or shower.  If you need to sit down in the shower, use a plastic, non-slip stool.  Keep the floor dry. Clean up any water that  spills on the floor as soon as it happens.  Remove soap buildup in the tub or shower regularly.  Attach bath mats securely with double-sided non-slip rug tape.  Do not have throw rugs and other things on the floor that can make you trip. What can I do in the bedroom?  Use night lights.  Make sure that you have a light by your bed that is easy to reach.  Do not use any sheets or blankets that are too big for your bed. They should not hang down onto the floor.  Have a firm chair that has side arms. You can use this for support while you get dressed.  Do not have throw rugs and other things on the floor that can make you trip. What can I do in the kitchen?  Clean up any spills right away.  Avoid walking on wet floors.  Keep items that you use a lot in easy-to-reach places.  If you need to reach something above you, use a strong step stool that has a grab bar.  Keep electrical cords out of the way.  Do not use floor polish or wax that makes floors slippery. If  you must use wax, use non-skid floor wax.  Do not have throw rugs and other things on the floor that can make you trip. What can I do with my stairs?  Do not leave any items on the stairs.  Make sure that there are handrails on both sides of the stairs and use them. Fix handrails that are broken or loose. Make sure that handrails are as long as the stairways.  Check any carpeting to make sure that it is firmly attached to the stairs. Fix any carpet that is loose or worn.  Avoid having throw rugs at the top or bottom of the stairs. If you do have throw rugs, attach them to the floor with carpet tape.  Make sure that you have a light switch at the top of the stairs and the bottom of the stairs. If you do not have them, ask someone to add them for you. What else can I do to help prevent falls?  Wear shoes that:  Do not have high heels.  Have rubber bottoms.  Are comfortable and fit you well.  Are closed at the  toe. Do not wear sandals.  If you use a stepladder:  Make sure that it is fully opened. Do not climb a closed stepladder.  Make sure that both sides of the stepladder are locked into place.  Ask someone to hold it for you, if possible.  Clearly mark and make sure that you can see:  Any grab bars or handrails.  First and last steps.  Where the edge of each step is.  Use tools that help you move around (mobility aids) if they are needed. These include:  Canes.  Walkers.  Scooters.  Crutches.  Turn on the lights when you go into a dark area. Replace any light bulbs as soon as they burn out.  Set up your furniture so you have a clear path. Avoid moving your furniture around.  If any of your floors are uneven, fix them.  If there are any pets around you, be aware of where they are.  Review your medicines with your doctor. Some medicines can make you feel dizzy. This can increase your chance of falling. Ask your doctor what other things that you can do to help prevent falls. This information is not intended to replace advice given to you by your health care provider. Make sure you discuss any questions you have with your health care provider. Document Released: 08/04/2009 Document Revised: 03/15/2016 Document Reviewed: 11/12/2014 Elsevier Interactive Patient Education  2017 [employer].

## 2017-11-12 NOTE — Progress Notes (Signed)
Subjective:   Jason Riley is a 84 y.o. male who presents for Medicare Annual/Subsequent preventive examination.  Review of Systems:  N/A  Cardiac Risk Factors include: advanced age (>84men, >89 women);dyslipidemia;hypertension;male gender;smoking/ tobacco exposure     Objective:    Vitals: BP (!) 114/50   Pulse 68   Temp (!) 97.5 F (36.4 C) (Oral)   Ht 5\' 8"  (1.727 m)   Wt 142 lb 9.6 oz (64.7 kg)   BMI 21.68 kg/m   Body mass index is 21.68 kg/m.  Advanced Directives 11/12/2017 11/06/2016  Does Patient Have a Medical Advance Directive? Yes No;Yes  Type of Paramedic of [place];Living will Jason Riley;Living will  Copy of Jason Riley in Chart? No - copy requested No - copy requested    Tobacco Social History   Tobacco Use  Smoking Status Former Smoker  . Years: 10.00  . Types: Cigars  Smokeless Tobacco Current User  . Types: Chew  Tobacco Comment   quit over 30 years ago     Ready to quit: Not Answered Counseling given: Not Answered Comment: quit over 30 years ago   Clinical Intake:  Pre-visit preparation completed: Yes  Pain : No/denies pain Pain Score: 0-No pain     Nutritional Status: BMI of 19-24  Normal Nutritional Risks: None Diabetes: No  How often do you need to have someone help you when you read instructions, pamphlets, or other written materials from your doctor or pharmacy?: 1 - Never  Interpreter Needed?: No  Information entered by :: [facility], LPN  Past Medical History:  Diagnosis Date  . CN (constipation)   . Elevated transaminase level   . History of measles   . Hyperlipidemia   . Hypertension   . Prostate cancer [facility])    Prostatectomy 2001   Past Surgical History:  Procedure Laterality Date  . APPENDECTOMY  1955  . HERNIA REPAIR Right 2006   Dr. Jamal Collin  . PROSTATECTOMY  2001   Dr. Yves Dill   Family History  Problem Relation Age of Onset  . Heart disease Mother    . Colon cancer Father   . Obesity Sister    Social History   Socioeconomic History  . Marital status: Widowed    Spouse name: None  . Number of children: 1  . Years of education: Some Coll  . Highest education level: Some college, no degree  Social Needs  . Financial resource strain: Not hard at all  . Food insecurity - worry: Never true  . Food insecurity - inability: Never true  . Transportation needs - medical: No  . Transportation needs - non-medical: No  Occupational History  . Occupation: Retired  Tobacco Use  . Smoking status: Former Smoker    Years: 10.00    Types: Cigars  . Smokeless tobacco: Current User    Types: Chew  . Tobacco comment: quit over 30 years ago  Substance and Sexual Activity  . Alcohol use: No    Alcohol/week: 0.0 oz  . Drug use: No  . Sexual activity: None  Other Topics Concern  . None  Social History Narrative  . None    Outpatient Encounter Medications as of 11/12/2017  Medication Sig  . aspirin (ASPIRIN LOW DOSE) 81 MG EC tablet Take 1 tablet by mouth daily.  Marland Kitchen docusate sodium (STOOL SOFTENER) 100 MG capsule Take 100 mg by mouth daily.  Marland Kitchen levothyroxine (SYNTHROID, LEVOTHROID) 50 MCG tablet Take 1 tablet (50  mcg total) by mouth daily.  Marland Kitchen lisinopril-hydrochlorothiazide (PRINZIDE,ZESTORETIC) 10-12.5 MG tablet Take 1 tablet by mouth daily.  . rosuvastatin (CRESTOR) 20 MG tablet Take 1 tablet (20 mg total) by mouth daily.   No facility-administered encounter medications on file as of 11/12/2017.     Activities of Daily Living In your present state of health, do you have any difficulty performing the following activities: 11/12/2017  Hearing? Y  Comment Wears bilateral hearing aids.   Vision? N  Difficulty concentrating or making decisions? N  Walking or climbing stairs? N  Dressing or bathing? N  Doing errands, shopping? N  Preparing Food and eating ? N  Using the Toilet? N  In the past six months, have you accidently leaked urine?  N  Do you have problems with loss of bowel control? N  Managing your Medications? N  Managing your Finances? N  Housekeeping or managing your Housekeeping? N  Some recent data might be hidden    Patient Care Team: [name], MD as PCP - General (Family Medicine) Yolonda Kida, MD as Consulting Physician (Cardiology) Eulogio Bear, MD as Consulting Physician (Ophthalmology)   Assessment:   This is a routine wellness examination for Jason Riley.  Exercise Activities and Dietary recommendations Current Exercise Habits: The patient does not participate in regular exercise at present, Exercise limited by: None identified  Goals    . Quit Chewing     Recommend to quit chewing tobacco. Pt plans to taper down on amount chewed, until completely quits.        Fall Risk Fall Risk  11/12/2017 11/06/2016 11/04/2015  Falls in the past year? No No No   Is the patient's home free of loose throw rugs in walkways, pet beds, electrical cords, etc?   yes      Grab bars in the bathroom? yes      Handrails on the stairs?  n/a      Adequate lighting?   yes  Timed Get Up and Go Performed: N/A  Depression Screen PHQ 2/9 Scores 11/12/2017 11/06/2016 11/04/2015  PHQ - 2 Score 0 1 0    Cognitive Function: Pt decline screening today.     6CIT Screen 11/06/2016  What Year? 0 points  What month? 0 points  What time? 0 points  Count back from 20 0 points  Months in reverse 2 points  Repeat phrase 0 points  Total Score 2    Immunization History  Administered Date(s) Administered  . Influenza, High Dose Seasonal PF 08/12/2015  . Influenza-Unspecified 08/23/2017  . Pneumococcal Conjugate-13 08/17/2013    Qualifies for Shingles Vaccine? Due for Shingles vaccine. Declined my offer to administer today. Education has been provided regarding the importance of this vaccine. Pt has been advised to call her insurance company to determine her out of pocket expense. Advised she may also receive  this vaccine at her local pharmacy or Health Dept. Verbalized acceptance and understanding.  Screening Tests Health Maintenance  Topic Date Due  . TETANUS/TDAP  12/27/1947  . PNA vac Low Risk Adult (2 of 2 - PPSV23) 08/17/2014  . INFLUENZA VACCINE  Completed   Cancer Screenings: Lung: Low Dose CT Chest recommended if Age 71-80 years, 30 pack-year currently smoking OR have quit w/in 15years. Patient does not qualify. Colorectal: Up to date and no longer required  Additional Screenings:  Hepatitis B/HIV/Syphillis: Pt declines today.  Hepatitis C Screening: Pt declines today.     Plan:  I have personally reviewed and  addressed the Medicare Annual Wellness questionnaire and have noted the following in the patient's chart:  A. Medical and social history B. Use of alcohol, tobacco or illicit drugs  C. Current medications and supplements D. Functional ability and status E.  Nutritional status F.  Physical activity G. Advance directives H. List of other physicians I.  Hospitalizations, surgeries, and ER visits in previous 12 months J.  [place] such as hearing and vision if needed, cognitive and depression L. Referrals and appointments - none  In addition, I have reviewed and discussed with patient certain preventive protocols, quality metrics, and best practice recommendations. A written personalized care plan for preventive services as well as general preventive health recommendations were provided to patient.  See attached scanned questionnaire for additional information.   Signed,  Fabio Neighbors, LPN Nurse Health Advisor   Nurse Recommendations: Pt declined the tetanus vaccine today. Will contact pharmacy to update Pneumovax admin date.

## 2017-11-12 NOTE — Telephone Encounter (Signed)
This encounter was created in error - please disregard.

## 2017-11-19 DIAGNOSIS — H903 Sensorineural hearing loss, bilateral: Secondary | ICD-10-CM | POA: Diagnosis not present

## 2017-11-19 DIAGNOSIS — H6123 Impacted cerumen, bilateral: Secondary | ICD-10-CM | POA: Diagnosis not present

## 2017-11-20 ENCOUNTER — Encounter: Payer: Medicare Other | Admitting: Family Medicine

## 2017-11-20 DIAGNOSIS — H35372 Puckering of macula, left eye: Secondary | ICD-10-CM | POA: Diagnosis not present

## 2017-11-25 ENCOUNTER — Encounter: Payer: Self-pay | Admitting: Family Medicine

## 2017-11-25 ENCOUNTER — Ambulatory Visit (INDEPENDENT_AMBULATORY_CARE_PROVIDER_SITE_OTHER): Payer: Medicare Other | Admitting: Family Medicine

## 2017-11-25 VITALS — BP 130/68 | HR 64 | Temp 97.9°F | Resp 16 | Ht 68.0 in | Wt 147.0 lb

## 2017-11-25 DIAGNOSIS — E039 Hypothyroidism, unspecified: Secondary | ICD-10-CM | POA: Diagnosis not present

## 2017-11-25 DIAGNOSIS — Z0000 Encounter for general adult medical examination without abnormal findings: Secondary | ICD-10-CM

## 2017-11-25 DIAGNOSIS — N183 Chronic kidney disease, stage 3 unspecified: Secondary | ICD-10-CM

## 2017-11-25 DIAGNOSIS — I10 Essential (primary) hypertension: Secondary | ICD-10-CM

## 2017-11-25 DIAGNOSIS — Z0001 Encounter for general adult medical examination with abnormal findings: Secondary | ICD-10-CM | POA: Diagnosis not present

## 2017-11-25 DIAGNOSIS — E785 Hyperlipidemia, unspecified: Secondary | ICD-10-CM | POA: Diagnosis not present

## 2017-11-25 DIAGNOSIS — H9193 Unspecified hearing loss, bilateral: Secondary | ICD-10-CM | POA: Diagnosis not present

## 2017-11-25 DIAGNOSIS — I080 Rheumatic disorders of both mitral and aortic valves: Secondary | ICD-10-CM | POA: Diagnosis not present

## 2017-11-25 DIAGNOSIS — I2510 Atherosclerotic heart disease of native coronary artery without angina pectoris: Secondary | ICD-10-CM | POA: Diagnosis not present

## 2017-11-25 NOTE — Patient Instructions (Signed)
   Please go to the lab draw center in [address] on the second floor of [facility] when you are fasting

## 2017-11-25 NOTE — Progress Notes (Signed)
Patient: Jason Riley, Male    DOB: 1928/11/20, 84 y.o.   MRN: [id number] Visit Date: 11/25/2017  Today's Provider: Lelon Huh, MD   Chief Complaint  Patient presents with  . Annual Exam  . Hypertension  . Hypothyroidism  . Hyperlipidemia   Subjective:     Complete Physical Jason Riley is a 84 y.o. male. He feels well. He reports exercising yes. He reports he is sleeping fairly well.  -----------------------------------------------------------   Hypertension, follow-up:  BP Readings from Last 3 Encounters:  11/25/17 130/68  11/12/17 (!) 114/50  11/06/16 129/63    He was last seen for hypertension 1 years ago.  BP at that visit was 129/63. Management since that visit includes; no changes.He reports good compliance with treatment. He is not having side effects. none He is exercising. He is not adherent to low salt diet.   Outside blood pressures are not checking. He is experiencing none.  Patient denies none.   Cardiovascular risk factors include advanced age (older than 70 for men, 74 for women).  Use of agents associated with hypertension: none.   ----------------------------------------------------------------    Lipid/Cholesterol, Follow-up:   Last seen for this 1 years ago.  Management since that visit includes; labs checked, no changes.  Last Lipid Panel:    Component Value Date/Time   CHOL 136 11/06/2016 1419   TRIG 116 11/06/2016 1419   HDL 53 11/06/2016 1419   CHOLHDL 2.6 11/06/2016 1419   LDLCALC 60 11/06/2016 1419    He reports good compliance with treatment. He is not having side effects.   Wt Readings from Last 3 Encounters:  11/25/17 147 lb (66.7 kg)  11/12/17 142 lb 9.6 oz (64.7 kg)  11/06/16 145 lb (65.8 kg)    ----------------------------------------------------------------  Coronary artery disease involving native coronary artery of native heart without angina pectoris From 11/06/2016-no changes. States having no chest  pain, or heart flutters. Gets slightly short of breath with exertion. Is due to follow up with cardiology but has appointment scheduled.   Hypothyroidism, unspecified type From 11/06/2016-labs checked, no changes. Lab Results  Component Value Date   TSH 2.460 11/06/2016    CKD (chronic kidney disease), stage III From 11/06/2016-labs checked, no changes.  BMET    Component Value Date/Time   NA 143 11/06/2016 1419   K 4.6 11/06/2016 1419   CL 104 11/06/2016 1419   CO2 23 11/06/2016 1419   GLUCOSE 94 11/06/2016 1419   BUN 26 11/06/2016 1419   CREATININE 1.86 (H) 11/06/2016 1419   CALCIUM 9.8 11/06/2016 1419   GFRNONAA 32 (L) 11/06/2016 1419   GFRAA 37 (L) 11/06/2016 1419      Review of Systems  Constitutional: Negative for chills, diaphoresis and fever.  HENT: Negative for congestion, ear discharge, ear pain, hearing loss, nosebleeds, sore throat and tinnitus.   Eyes: Negative for photophobia, pain, discharge and redness.  Respiratory: Negative for cough, shortness of breath, wheezing and stridor.   Cardiovascular: Negative for chest pain, palpitations and leg swelling.  Gastrointestinal: Negative for abdominal pain, blood in stool, constipation, diarrhea, nausea and vomiting.  Endocrine: Negative for polydipsia.  Genitourinary: Negative for dysuria, flank pain, frequency, hematuria and urgency.  Musculoskeletal: Negative for back pain, myalgias and neck pain.  Skin: Negative for rash.  Allergic/Immunologic: Negative for environmental allergies.  Neurological: Negative for dizziness, tremors, seizures, weakness and headaches.  Hematological: Does not bruise/bleed easily.  Psychiatric/Behavioral: Negative for hallucinations and suicidal ideas. The patient is  not nervous/anxious.   All other systems reviewed and are negative.   Social History   Socioeconomic History  . Marital status: Widowed    Spouse name: Not on file  . Number of children: 1  . Years of education:  Some Coll  . Highest education level: Some college, no degree  Social Needs  . Financial resource strain: Not hard at all  . Food insecurity - worry: Never true  . Food insecurity - inability: Never true  . Transportation needs - medical: No  . Transportation needs - non-medical: No  Occupational History  . Occupation: Retired  Tobacco Use  . Smoking status: Former Smoker    Years: 10.00    Types: Cigars  . Smokeless tobacco: Current User    Types: Chew  . Tobacco comment: quit over 30 years ago  Substance and Sexual Activity  . Alcohol use: No    Alcohol/week: 0.0 oz  . Drug use: No  . Sexual activity: Not on file  Other Topics Concern  . Not on file  Social History Narrative  . Not on file    Past Medical History:  Diagnosis Date  . CN (constipation)   . Elevated transaminase level   . History of measles   . Hyperlipidemia   . Hypertension   . Prostate cancer [facility])    Prostatectomy 2001     Patient Active Problem List   Diagnosis Date Noted  . Back pain 11/03/2015  . Carotid bruit 11/03/2015  . History of measles 11/03/2015  . CKD (chronic kidney disease), stage III ([place]) 11/03/2015  . Coronary artery disease involving native heart 11/03/2015  . DOE (dyspnea on exertion) 11/03/2015  . Hyperlipidemia 11/03/2015  . Hypothyroid 11/03/2015  . Kidney stone 11/03/2015  . History of prostate cancer 11/03/2015  . Right inguinal pain 11/03/2015  . Aortic stenosis with mitral and aortic insufficiency 08/15/2012  . Hypertension 08/15/2012    Past Surgical History:  Procedure Laterality Date  . APPENDECTOMY  1955  . HERNIA REPAIR Right 2006   Dr. Jamal Collin  . PROSTATECTOMY  2001   Dr. Yves Dill    His family history includes Colon cancer in his father; Heart disease in his mother; Obesity in his sister.      Current Outpatient Medications:  .  aspirin (ASPIRIN LOW DOSE) 81 MG EC tablet, Take 1 tablet by mouth daily., Disp: , Rfl:  .  docusate sodium (STOOL  SOFTENER) 100 MG capsule, Take 100 mg by mouth daily., Disp: , Rfl:  .  levothyroxine (SYNTHROID, LEVOTHROID) 50 MCG tablet, Take 1 tablet (50 mcg total) by mouth daily., Disp: 90 tablet, Rfl: 3 .  lisinopril-hydrochlorothiazide (PRINZIDE,ZESTORETIC) 10-12.5 MG tablet, Take 1 tablet by mouth daily., Disp: 90 tablet, Rfl: 3 .  rosuvastatin (CRESTOR) 20 MG tablet, Take 1 tablet (20 mg total) by mouth daily., Disp: 90 tablet, Rfl: 3  Patient Care Team: [name], MD as PCP - General (Family Medicine) Yolonda Kida, MD as Consulting Physician (Cardiology) Eulogio Bear, MD as Consulting Physician (Ophthalmology)     Objective:   Vitals: BP 130/68 (BP Location: Right Arm, Patient Position: Sitting, Cuff Size: Normal)   Pulse 64   Temp 97.9 F (36.6 C) (Oral)   Resp 16   Ht 5\' 8"  (1.727 m)   Wt 147 lb (66.7 kg)   SpO2 97%   BMI 22.35 kg/m   Physical Exam   General Appearance:    Alert, cooperative, no distress, appears stated age  Head:    Normocephalic, without obvious abnormality, atraumatic  Eyes:    PERRL, conjunctiva/corneas clear, EOM's intact, fundi    benign, both eyes       Ears:    Normal TM's and external ear canals, both ears, hard of hearing.   Nose:   Nares normal, septum midline, mucosa normal, no drainage   or sinus tenderness  Throat:   Lips, mucosa, and tongue normal; teeth and gums normal  Neck:   Supple, symmetrical, trachea midline, no adenopathy;       thyroid:  No enlargement/tenderness/nodules; no carotid   bruit or JVD  Back:     Symmetric, no curvature, ROM normal, no CVA tenderness  Lungs:     Clear to auscultation bilaterally, respirations unlabored  Chest wall:    No tenderness or deformity  Heart:    Regular rate and rhythm, S1 and S2 normal, III/VI systolic murmur radiating to left carotid, no rub   or gallop  Abdomen:     Soft, non-tender, bowel sounds active all four quadrants,    no masses, no organomegaly  Genitalia:     deferred  Rectal:    deferred  Extremities:   Extremities normal, atraumatic, no cyanosis or edema  Pulses:   2+ and symmetric all extremities  Skin:   Skin color, texture, turgor normal, no rashes or lesions  Lymph nodes:   Cervical, supraclavicular, and axillary nodes normal  Neurologic:   CNII-XII intact. Normal strength, sensation and reflexes      throughout    Activities of Daily Living In your present state of health, do you have any difficulty performing the following activities: 11/12/2017  Hearing? Y  Comment Wears bilateral hearing aids.   Vision? N  Difficulty concentrating or making decisions? N  Walking or climbing stairs? N  Dressing or bathing? N  Doing errands, shopping? N  Preparing Food and eating ? N  Using the Toilet? N  In the past six months, have you accidently leaked urine? N  Do you have problems with loss of bowel control? N  Managing your Medications? N  Managing your Finances? N  Housekeeping or managing your Housekeeping? N  Some recent data might be hidden    Fall Risk Assessment Fall Risk  11/12/2017 11/06/2016 11/04/2015  Falls in the past year? No No No     Depression Screen PHQ 2/9 Scores 11/12/2017 11/06/2016 11/04/2015  PHQ - 2 Score 0 1 0     Assessment & Plan:    Annual Physical Reviewed patient's Family Medical History Reviewed and updated list of patient's medical providers Assessment of cognitive impairment was done Assessed patient's functional ability Established a written schedule for health screening [place] Completed and Reviewed  Exercise Activities and Dietary recommendations Goals    . Quit Chewing     Recommend to quit chewing tobacco. Pt plans to taper down on amount chewed, until completely quits.        Immunization History  Administered Date(s) Administered  . Influenza, High Dose Seasonal PF 08/12/2015  . Influenza-Unspecified 08/23/2017  . Pneumococcal Conjugate-13 08/17/2013  .  Pneumococcal Polysaccharide-23 10/24/2017    Health Maintenance  Topic Date Due  . TETANUS/TDAP  12/27/1949  . INFLUENZA VACCINE  Completed  . PNA vac Low Risk Adult  Completed     Discussed health benefits of physical activity, and encouraged him to engage in regular exercise appropriate for his age and condition.    ------------------------------------------------------------------------------------------------------------  1. Annual physical  exam Generally doing well  2. Coronary artery disease involving native coronary artery of native heart without angina pectoris Asymptomatic. Compliant with medication.  Continue aggressive risk factor modification.  Due for follow up with Dr. Clayborn Bigness.  - Ambulatory referral to Cardiology  3. CKD (chronic kidney disease), stage III ([place]) Check renal functions.   4. Hyperlipidemia, unspecified hyperlipidemia type He is tolerating rosuvastatin well with no adverse effects.   - Lipid panel - Comprehensive metabolic panel  5. Aortic stenosis with mitral and aortic insufficiency  - Ambulatory referral to Cardiology  6. Essential hypertension Well controlled.  Continue current medications.    7. Hypothyroidism, unspecified type  - TSH   Lelon Huh, MD  [facility]

## 2017-11-26 DIAGNOSIS — E039 Hypothyroidism, unspecified: Secondary | ICD-10-CM | POA: Diagnosis not present

## 2017-11-26 DIAGNOSIS — E785 Hyperlipidemia, unspecified: Secondary | ICD-10-CM | POA: Diagnosis not present

## 2017-11-27 ENCOUNTER — Encounter: Payer: Self-pay | Admitting: Family Medicine

## 2017-11-27 ENCOUNTER — Telehealth: Payer: Self-pay

## 2017-11-27 LAB — COMPREHENSIVE METABOLIC PANEL
A/G RATIO: 1.6 (ref 1.2–2.2)
ALT: 22 IU/L (ref 0–44)
AST: 18 IU/L (ref 0–40)
Albumin: 4.1 g/dL (ref 3.5–4.7)
Alkaline Phosphatase: 70 IU/L (ref 39–117)
BILIRUBIN TOTAL: 0.5 mg/dL (ref 0.0–1.2)
BUN/Creatinine Ratio: 14 (ref 10–24)
BUN: 27 mg/dL (ref 8–27)
CALCIUM: 9.6 mg/dL (ref 8.6–10.2)
CO2: 21 mmol/L (ref 20–29)
Chloride: 106 mmol/L (ref 96–106)
Creatinine, Ser: 1.99 mg/dL — ABNORMAL HIGH (ref 0.76–1.27)
GFR calc Af Amer: 34 mL/min/{1.73_m2} — ABNORMAL LOW (ref >59)
GFR calc non Af Amer: 30 mL/min/{1.73_m2} — ABNORMAL LOW (ref >59)
Globulin, Total: 2.5 g/dL (ref 1.5–4.5)
Glucose: 90 mg/dL (ref 65–99)
POTASSIUM: 4.2 mmol/L (ref 3.5–5.2)
Sodium: 145 mmol/L — ABNORMAL HIGH (ref 134–144)
Total Protein: 6.6 g/dL (ref 6.0–8.5)

## 2017-11-27 LAB — LIPID PANEL
CHOL/HDL RATIO: 3 ratio (ref 0.0–5.0)
Cholesterol, Total: 140 mg/dL (ref 100–199)
HDL: 46 mg/dL (ref >39)
LDL CALC: 68 mg/dL (ref 0–99)
TRIGLYCERIDES: 129 mg/dL (ref 0–149)
VLDL Cholesterol Cal: 26 mg/dL (ref 5–40)

## 2017-11-27 LAB — TSH: TSH: 4.56 u[IU]/mL — AB (ref 0.450–4.500)

## 2017-11-27 NOTE — Telephone Encounter (Signed)
LMTCB 11/27/2017  Thanks,   -Mickel Baas

## 2017-11-27 NOTE — Telephone Encounter (Signed)
-----   Message from [name], MD sent at 11/27/2017  7:58 AM EST ----- Cholesterol is good at 140. Kidney functions are stable. Thyroid functions is normal. Continue current medications.  Schedule follow up in 6 months for BP and recheck kidney functions.

## 2017-11-28 NOTE — Telephone Encounter (Signed)
Patient was notified of results. Patient expressed understanding. Pt scheduled f/u appt.

## 2017-11-29 DIAGNOSIS — I350 Nonrheumatic aortic (valve) stenosis: Secondary | ICD-10-CM | POA: Diagnosis not present

## 2017-11-29 DIAGNOSIS — I10 Essential (primary) hypertension: Secondary | ICD-10-CM | POA: Diagnosis not present

## 2017-11-29 DIAGNOSIS — E079 Disorder of thyroid, unspecified: Secondary | ICD-10-CM | POA: Diagnosis not present

## 2017-11-29 DIAGNOSIS — R011 Cardiac murmur, unspecified: Secondary | ICD-10-CM | POA: Diagnosis not present

## 2017-11-29 DIAGNOSIS — R0602 Shortness of breath: Secondary | ICD-10-CM | POA: Diagnosis not present

## 2018-02-04 DIAGNOSIS — H532 Diplopia: Secondary | ICD-10-CM | POA: Diagnosis not present

## 2018-03-12 ENCOUNTER — Other Ambulatory Visit
Admission: RE | Admit: 2018-03-12 | Discharge: 2018-03-12 | Disposition: A | Discharge status: Home / Self Care | Payer: Medicare Other | Source: Ambulatory Visit | Attending: Ophthalmology | Admitting: Ophthalmology

## 2018-03-12 DIAGNOSIS — H02402 Unspecified ptosis of left eyelid: Secondary | ICD-10-CM | POA: Insufficient documentation

## 2018-03-12 DIAGNOSIS — H532 Diplopia: Secondary | ICD-10-CM | POA: Diagnosis not present

## 2018-03-12 LAB — TSH: TSH: 0.993 u[IU]/mL (ref 0.350–4.500)

## 2018-03-13 LAB — MISC LABCORP TEST (SEND OUT): LABCORP TEST CODE: 85926

## 2018-03-14 LAB — ACETYLCHOLINE RECEPTOR, BINDING: ACETYLCHOLINE BINDING AB: 33.7 nmol/L — AB (ref 0.00–0.24)

## 2018-03-26 LAB — ACETYLCHOLINE RECEPTOR, BLOCKING: Acetylchol Block Ab: 41 % — ABNORMAL HIGH (ref 0–25)

## 2018-03-31 ENCOUNTER — Other Ambulatory Visit: Payer: Self-pay | Admitting: Family Medicine

## 2018-04-08 ENCOUNTER — Other Ambulatory Visit: Payer: Self-pay | Admitting: Neurology

## 2018-04-08 DIAGNOSIS — G7000 Myasthenia gravis without (acute) exacerbation: Secondary | ICD-10-CM | POA: Diagnosis not present

## 2018-04-08 DIAGNOSIS — R531 Weakness: Secondary | ICD-10-CM | POA: Diagnosis not present

## 2018-04-08 DIAGNOSIS — K0889 Other specified disorders of teeth and supporting structures: Secondary | ICD-10-CM

## 2018-04-08 DIAGNOSIS — R633 Feeding difficulties: Secondary | ICD-10-CM

## 2018-04-11 ENCOUNTER — Ambulatory Visit: Admission: RE | Admit: 2018-04-11 | Payer: Medicare Other | Source: Ambulatory Visit

## 2018-04-16 ENCOUNTER — Ambulatory Visit
Admission: RE | Admit: 2018-04-16 | Discharge: 2018-04-16 | Disposition: A | Discharge status: Home / Self Care | Payer: Medicare Other | Source: Ambulatory Visit | Attending: Neurology | Admitting: Neurology

## 2018-04-16 DIAGNOSIS — K0889 Other specified disorders of teeth and supporting structures: Secondary | ICD-10-CM

## 2018-04-16 DIAGNOSIS — R531 Weakness: Secondary | ICD-10-CM | POA: Insufficient documentation

## 2018-04-16 DIAGNOSIS — R918 Other nonspecific abnormal finding of lung field: Secondary | ICD-10-CM | POA: Insufficient documentation

## 2018-04-16 DIAGNOSIS — I350 Nonrheumatic aortic (valve) stenosis: Secondary | ICD-10-CM | POA: Insufficient documentation

## 2018-04-16 DIAGNOSIS — D71 Functional disorders of polymorphonuclear neutrophils: Secondary | ICD-10-CM | POA: Diagnosis not present

## 2018-04-16 DIAGNOSIS — I700 Atherosclerosis of aorta: Secondary | ICD-10-CM | POA: Diagnosis not present

## 2018-04-16 DIAGNOSIS — R633 Feeding difficulties: Secondary | ICD-10-CM | POA: Insufficient documentation

## 2018-04-16 DIAGNOSIS — J984 Other disorders of lung: Secondary | ICD-10-CM | POA: Diagnosis not present

## 2018-04-17 DIAGNOSIS — G7000 Myasthenia gravis without (acute) exacerbation: Secondary | ICD-10-CM | POA: Diagnosis not present

## 2018-04-29 DIAGNOSIS — R531 Weakness: Secondary | ICD-10-CM | POA: Diagnosis not present

## 2018-04-29 DIAGNOSIS — G7000 Myasthenia gravis without (acute) exacerbation: Secondary | ICD-10-CM | POA: Diagnosis not present

## 2018-05-02 ENCOUNTER — Encounter (HOSPITAL_COMMUNITY): Payer: Self-pay | Admitting: *Deleted

## 2018-05-02 ENCOUNTER — Emergency Department (HOSPITAL_COMMUNITY): Payer: Medicare Other

## 2018-05-02 ENCOUNTER — Other Ambulatory Visit: Payer: Self-pay

## 2018-05-02 ENCOUNTER — Inpatient Hospital Stay (HOSPITAL_COMMUNITY)
Admission: EM | Admit: 2018-05-02 | Discharge: 2018-05-11 | DRG: 057 | Disposition: A | Discharge status: Home / Self Care | Payer: Medicare Other | Attending: Internal Medicine | Admitting: Internal Medicine

## 2018-05-02 DIAGNOSIS — Z66 Do not resuscitate: Secondary | ICD-10-CM | POA: Diagnosis present

## 2018-05-02 DIAGNOSIS — R7989 Other specified abnormal findings of blood chemistry: Secondary | ICD-10-CM | POA: Diagnosis not present

## 2018-05-02 DIAGNOSIS — G7000 Myasthenia gravis without (acute) exacerbation: Secondary | ICD-10-CM

## 2018-05-02 DIAGNOSIS — N281 Cyst of kidney, acquired: Secondary | ICD-10-CM | POA: Diagnosis not present

## 2018-05-02 DIAGNOSIS — R1310 Dysphagia, unspecified: Secondary | ICD-10-CM | POA: Diagnosis present

## 2018-05-02 DIAGNOSIS — N179 Acute kidney failure, unspecified: Secondary | ICD-10-CM

## 2018-05-02 DIAGNOSIS — F17290 Nicotine dependence, other tobacco product, uncomplicated: Secondary | ICD-10-CM | POA: Diagnosis present

## 2018-05-02 DIAGNOSIS — I10 Essential (primary) hypertension: Secondary | ICD-10-CM | POA: Diagnosis not present

## 2018-05-02 DIAGNOSIS — Z9079 Acquired absence of other genital organ(s): Secondary | ICD-10-CM | POA: Diagnosis not present

## 2018-05-02 DIAGNOSIS — R269 Unspecified abnormalities of gait and mobility: Secondary | ICD-10-CM | POA: Diagnosis not present

## 2018-05-02 DIAGNOSIS — R748 Abnormal levels of other serum enzymes: Secondary | ICD-10-CM

## 2018-05-02 DIAGNOSIS — N183 Chronic kidney disease, stage 3 unspecified: Secondary | ICD-10-CM | POA: Diagnosis present

## 2018-05-02 DIAGNOSIS — Z8546 Personal history of malignant neoplasm of prostate: Secondary | ICD-10-CM | POA: Diagnosis not present

## 2018-05-02 DIAGNOSIS — W1830XA Fall on same level, unspecified, initial encounter: Secondary | ICD-10-CM | POA: Diagnosis present

## 2018-05-02 DIAGNOSIS — I340 Nonrheumatic mitral (valve) insufficiency: Secondary | ICD-10-CM | POA: Diagnosis not present

## 2018-05-02 DIAGNOSIS — Z79890 Hormone replacement therapy (postmenopausal): Secondary | ICD-10-CM | POA: Diagnosis not present

## 2018-05-02 DIAGNOSIS — I2510 Atherosclerotic heart disease of native coronary artery without angina pectoris: Secondary | ICD-10-CM | POA: Diagnosis not present

## 2018-05-02 DIAGNOSIS — E039 Hypothyroidism, unspecified: Secondary | ICD-10-CM | POA: Diagnosis not present

## 2018-05-02 DIAGNOSIS — Z882 Allergy status to sulfonamides status: Secondary | ICD-10-CM | POA: Diagnosis not present

## 2018-05-02 DIAGNOSIS — I129 Hypertensive chronic kidney disease with stage 1 through stage 4 chronic kidney disease, or unspecified chronic kidney disease: Secondary | ICD-10-CM | POA: Diagnosis present

## 2018-05-02 DIAGNOSIS — G7001 Myasthenia gravis with (acute) exacerbation: Secondary | ICD-10-CM | POA: Diagnosis not present

## 2018-05-02 DIAGNOSIS — N184 Chronic kidney disease, stage 4 (severe): Secondary | ICD-10-CM | POA: Diagnosis present

## 2018-05-02 DIAGNOSIS — I080 Rheumatic disorders of both mitral and aortic valves: Secondary | ICD-10-CM | POA: Diagnosis not present

## 2018-05-02 DIAGNOSIS — R0602 Shortness of breath: Secondary | ICD-10-CM | POA: Diagnosis not present

## 2018-05-02 DIAGNOSIS — Z79899 Other long term (current) drug therapy: Secondary | ICD-10-CM | POA: Diagnosis not present

## 2018-05-02 DIAGNOSIS — R627 Adult failure to thrive: Secondary | ICD-10-CM | POA: Diagnosis present

## 2018-05-02 DIAGNOSIS — Z7982 Long term (current) use of aspirin: Secondary | ICD-10-CM | POA: Diagnosis not present

## 2018-05-02 DIAGNOSIS — Z888 Allergy status to other drugs, medicaments and biological substances status: Secondary | ICD-10-CM

## 2018-05-02 DIAGNOSIS — Z4901 Encounter for fitting and adjustment of extracorporeal dialysis catheter: Secondary | ICD-10-CM | POA: Diagnosis not present

## 2018-05-02 LAB — CBC
HCT: 43.6 % (ref 39.0–52.0)
Hemoglobin: 14.2 g/dL (ref 13.0–17.0)
MCH: 30.7 pg (ref 26.0–34.0)
MCHC: 32.6 g/dL (ref 30.0–36.0)
MCV: 94.4 fL (ref 78.0–100.0)
PLATELETS: 171 10*3/uL (ref 150–400)
RBC: 4.62 MIL/uL (ref 4.22–5.81)
RDW: 12.7 % (ref 11.5–15.5)
WBC: 5.8 10*3/uL (ref 4.0–10.5)

## 2018-05-02 LAB — URINALYSIS, ROUTINE W REFLEX MICROSCOPIC
BACTERIA UA: NONE SEEN
Bilirubin Urine: NEGATIVE
Glucose, UA: NEGATIVE mg/dL
Hgb urine dipstick: NEGATIVE
KETONES UR: NEGATIVE mg/dL
Leukocytes, UA: NEGATIVE
Nitrite: NEGATIVE
PH: 5 (ref 5.0–8.0)
PROTEIN: NEGATIVE mg/dL
Specific Gravity, Urine: 1.018 (ref 1.005–1.030)

## 2018-05-02 LAB — BASIC METABOLIC PANEL
Anion gap: 12 (ref 5–15)
BUN: 53 mg/dL — AB (ref 8–23)
CHLORIDE: 103 mmol/L (ref 98–111)
CO2: 25 mmol/L (ref 22–32)
CREATININE: 3.57 mg/dL — AB (ref 0.61–1.24)
Calcium: 9.8 mg/dL (ref 8.9–10.3)
GFR calc Af Amer: 16 mL/min — ABNORMAL LOW (ref >60)
GFR calc non Af Amer: 14 mL/min — ABNORMAL LOW (ref >60)
GLUCOSE: 110 mg/dL — AB (ref 70–99)
Potassium: 4.1 mmol/L (ref 3.5–5.1)
SODIUM: 140 mmol/L (ref 135–145)

## 2018-05-02 LAB — TROPONIN I
Troponin I: 0.03 ng/mL (ref <0.03)
Troponin I: 0.04 ng/mL (ref <0.03)

## 2018-05-02 MED ORDER — PYRIDOSTIGMINE BROMIDE 60 MG PO TABS
60.0000 mg | ORAL_TABLET | Freq: Four times a day (QID) | ORAL | Status: DC
  Administered 2018-05-02 – 2018-05-11 (×32): 60 mg via ORAL
  Filled 2018-05-02 (×36): qty 1

## 2018-05-02 MED ORDER — HEPARIN SODIUM (PORCINE) 5000 UNIT/ML IJ SOLN
5000.0000 [IU] | Freq: Three times a day (TID) | INTRAMUSCULAR | Status: DC
  Administered 2018-05-02 – 2018-05-03 (×2): 5000 [IU] via SUBCUTANEOUS
  Filled 2018-05-02 (×2): qty 1

## 2018-05-02 MED ORDER — SODIUM CHLORIDE 0.9 % IV BOLUS
500.0000 mL | Freq: Once | INTRAVENOUS | Status: AC
  Administered 2018-05-02: 500 mL via INTRAVENOUS

## 2018-05-02 MED ORDER — LEVOTHYROXINE SODIUM 50 MCG PO TABS
50.0000 ug | ORAL_TABLET | Freq: Every day | ORAL | Status: DC
  Administered 2018-05-03 – 2018-05-06 (×4): 50 ug via ORAL
  Filled 2018-05-02 (×4): qty 1

## 2018-05-02 MED ORDER — DEXTROSE-NACL 5-0.45 % IV SOLN
INTRAVENOUS | Status: DC
  Administered 2018-05-02 – 2018-05-05 (×3): via INTRAVENOUS

## 2018-05-02 NOTE — ED Triage Notes (Signed)
The pt is c/o sob for the past 2 days blurred vision since December  He also fell 2 days ago  Weakness. He was seen by his doctor in [place] and was diagnosed with myasthenia gravis 2-3 days ago  They wanted to admit him to [place] but he did not wish to go   He has now decided that he needs to go

## 2018-05-02 NOTE — Progress Notes (Signed)
RT Note:  RT to obtain VC and NIF per order.  Patient leaving room and being transferred to 5W03. Receiving RT aware.

## 2018-05-02 NOTE — ED Provider Notes (Signed)
[facility] EMERGENCY DEPARTMENT Provider Note   CSN: [id number] Arrival date & time: 05/02/18  1538     History   Chief Complaint Chief Complaint  Patient presents with  . Shortness of Breath    HPI Jason Riley is a 84 y.o. male.  HPI  Patient presents today with his neice.  He has a history of myasthenia gravis, which was diagnosed in January of this year.  He saw his neurologist 4 days ago and endorsed symptoms of worsening weakness.  He has had trouble swallowing his pills, feels like occasionally water has been going down the wrong way over the past week.  Dates he is to take breaks during the middle of his meals.  His neurologist recommended admission at their visit on 7/9, however patient was not willing to do this at that time.  States he returns emergency room today because he has continued weakness and wants admission for myasthenia treatment.  He states he has had increasing shortness of breath over the last month, has to sit down after walking to his mailbox.  No chest pain.  He does see a cardiologist for aortic insufficiency, states they told him everything was going well there.  He states his left-sided ptosis began in December, now has double vision and difficulty seeing much at all.  Denies any at all over-the-counter medication, including NSAIDs, cough or cold medicine.  States he has a multivitamin, which he takes only rarely.  At his last neurology appointment he was recommended to begin CellCept, states he has not picked this up.  Unsure whether he has increased his Mestinon as prescribed.  He notes when he awoke 2 nights ago to go to the restroom his legs felt very weak and he slowly fell to the floor on his knees.  Did not have the upper body strength to help him get up from the floor.  This worried him. Past Medical History:  Diagnosis Date  . CN (constipation)   . Elevated transaminase level   . History of measles   . Prostate cancer [facility])    Prostatectomy 2001    Patient Active Problem List   Diagnosis Date Noted  . Myasthenia gravis ([place]) 05/02/2018  . AKI (acute kidney injury) ([place]) 05/02/2018  . Back pain 11/03/2015  . Carotid bruit 11/03/2015  . History of measles 11/03/2015  . CKD (chronic kidney disease), stage III ([place]) 11/03/2015  . Coronary artery disease involving native heart 11/03/2015  . DOE (dyspnea on exertion) 11/03/2015  . Hyperlipidemia 11/03/2015  . Hypothyroid 11/03/2015  . Kidney stone 11/03/2015  . History of prostate cancer 11/03/2015  . Right inguinal pain 11/03/2015  . Aortic stenosis with mitral and aortic insufficiency 08/15/2012  . Hypertension 08/15/2012    Past Surgical History:  Procedure Laterality Date  . APPENDECTOMY  1955  . HERNIA REPAIR Right 2006   Dr. Jamal Collin  . PROSTATECTOMY  2001   Dr. Yves Dill        Home Medications    Prior to Admission medications   Medication Sig Start Date End Date Taking? Authorizing Provider  aspirin (ASPIRIN LOW DOSE) 81 MG EC tablet Take 81 mg by mouth daily.    Yes [provider]  levothyroxine (SYNTHROID, LEVOTHROID) 50 MCG tablet TAKE 1 TABLET BY MOUTH  DAILY 03/31/18  Yes [name], MD  lisinopril-hydrochlorothiazide (PRINZIDE,ZESTORETIC) 10-12.5 MG tablet TAKE 1 TABLET BY MOUTH  DAILY 03/31/18  Yes [name], MD  pyridostigmine (MESTINON) 60  MG tablet Take 60 mg by mouth 4 (four) times daily. 05/01/18  Yes [provider]  rosuvastatin (CRESTOR) 20 MG tablet TAKE 1 TABLET BY MOUTH  DAILY 03/31/18  Yes [name], MD  UNABLE TO FIND "Ice Breakers mints": Dissolve 1 mint in the mouth as needed for dry throat   Yes [provider]  mycophenolate (CELLCEPT) 250 MG capsule Take 250 mg by mouth See admin instructions. Take 250 mg by mouth once a day for 1 week, then increase to 250 mg two times a day 05/02/18   [provider]    Family History Family History  Problem Relation Age of Onset    . Heart disease Mother   . Colon cancer Father   . Obesity Sister     Social History Social History   Tobacco Use  . Smoking status: Former Smoker    Years: 10.00    Types: Cigars  . Smokeless tobacco: Current User    Types: Chew  . Tobacco comment: quit over 30 years ago  Substance Use Topics  . Alcohol use: No    Alcohol/week: 0.0 oz  . Drug use: No     Allergies   Atorvastatin and Sulfa antibiotics   Review of Systems Review of Systems   Physical Exam Updated Vital Signs BP 111/62   Pulse (!) 55   Temp 98.5 F (36.9 C)   Resp (!) 26   Ht 5\' 8"  (1.727 m)   Wt 63.5 kg (140 lb)   SpO2 99%   BMI 21.29 kg/m   Physical Exam  Constitutional: He is oriented to person, place, and time. He appears well-developed. He does not appear ill. No distress.  HENT:  Head: Normocephalic.  Mouth/Throat: Oropharynx is clear and moist.  Eyes:  L eyelid ptosis, normal EOMI  Neck: Normal range of motion. Neck supple.  Cardiovascular: Normal rate and regular rhythm.  Murmur heard. High pitched SEM 3/6 radiates to lung fields.   Pulmonary/Chest: Effort normal and breath sounds normal. No accessory muscle usage. No respiratory distress.  Abdominal: Soft. Bowel sounds are normal. He exhibits no distension. There is no tenderness.  Musculoskeletal: Normal range of motion.       Right lower leg: He exhibits no edema.       Left lower leg: He exhibits no edema.  Neurological: He is alert and oriented to person, place, and time. No cranial nerve deficit.  Skin: Skin is warm and dry. Capillary refill takes 2 to 3 seconds.  Psychiatric: He has a normal mood and affect. His behavior is normal.     ED Treatments / Results  Labs (all labs ordered are listed, but only abnormal results are displayed) Labs Reviewed  BASIC METABOLIC PANEL - Abnormal; Notable for the following components:      Result Value   Glucose, Bld 110 (*)    BUN 53 (*)    Creatinine, Ser 3.57 (*)    GFR  calc non Af Amer 14 (*)    GFR calc Af Amer 16 (*)    All other components within normal limits  TROPONIN I - Abnormal; Notable for the following components:   Troponin I 0.03 (*)    All other components within normal limits  URINALYSIS, ROUTINE W REFLEX MICROSCOPIC - Abnormal; Notable for the following components:   APPearance HAZY (*)    All other components within normal limits  CBC  TROPONIN I    EKG EKG Interpretation  Date/Time:  Friday  May 02 2018 15:49:46 EDT Ventricular Rate:  70 PR Interval:  258 QRS Duration: 116 QT Interval:  392 QTC Calculation: 423 R Axis:   -20 Text Interpretation:  Sinus rhythm with 1st degree A-V block Left ventricular hypertrophy with QRS widening and repolarization abnormality ST-t wave abnormality Abnormal ekg Confirmed by Carmin Muskrat [phone]) on 05/02/2018 3:55:57 PM   Radiology Dg Chest 2 View  Result Date: 05/02/2018 CLINICAL DATA:  Shortness of breath EXAM: CHEST - 2 VIEW COMPARISON:  Chest CT April 16, 2018 FINDINGS: There is a calcified granuloma in the right mid lung. There is no evident edema or consolidation. The heart size and pulmonary vascularity are normal. No adenopathy. There is aortic atherosclerosis. There is calcification in the aortic valve region. There is degenerative change in the thoracic spine. IMPRESSION: Calcified granuloma right mid lung.  No edema or consolidation. There is aortic atherosclerosis. Heart size normal. Calcification in the aortic valve raises concern for potential degree of aortic stenosis. Aortic Atherosclerosis (ICD10-I70.0). Electronically Signed   By: Lowella Grip III M.D.   On: 05/02/2018 16:30    Procedures Procedures (including critical care time)  Medications Ordered in ED Medications  sodium chloride 0.9 % bolus 500 mL (0 mLs Intravenous Stopped 05/02/18 1934)     Initial Impression / Assessment and Plan / ED Course  I have reviewed the triage vital signs and the nursing  notes.  Pertinent labs & imaging results that were available during my care of the patient were reviewed by me and considered in my medical decision making (see chart for details).     Patient with new MG diagnosis, but protecting his airway. W L ptosis and increasing weakness. Wants admission at the recommendation of his neurologist whom he saw on 7/9.  Called his neurologist's office today at 5 PM, awaiting return call.  Discussed with Dr. Melrose Nakayama at 5:22pm, he recommends admission with NIFs and plasma exchange.   BMP with AKI and trop elevated to 0.03. EKG with mildly widened QRS. Will consult for unassigned admission.   D/w Dr. Leonel Ramsay with neurology who will see in consult for consideration of plex.   D/w Dr. Si Raider of [facility] who will admit for AKI and management.   Final Clinical Impressions(s) / ED Diagnoses   Final diagnoses:  AKI (acute kidney injury) ([place])  Myasthenia gravis [facility])    ED Discharge Orders    None       Sela Hilding, MD 05/02/18 1941    Elnora Morrison, MD 05/02/18 [phone]

## 2018-05-02 NOTE — Progress Notes (Signed)
Pt admitted for possible myasthenia gravis exacerbation. Mestinon scheduled, pt NPO has SLP eval in the morning. Will hold PO med. MD notified.

## 2018-05-02 NOTE — H&P (Signed)
History and Physical    Jason Riley OZD:[id number] DOB: 10/16/1929 DOA: 05/02/2018  PCP: [name], MD  Patient coming from: home   Chief Complaint: weakness  HPI: Jason Riley is a 84 y.o. male with medical history significant for recently diagnosed myasthenia gravis (followed by [facility]), prostate cancer s/p prostatectomy 2001, severe AS (seen on 2015 TTE), CKD3, HTN, hypothyroid, who lives alone at home, presenting with the above.  Symptoms have been worsening for a month or two. Generalized weakness. Once fell and really struggled to get back up. Having continued double vision. Also occasional choking. Lots of unsteadiness. No chest pain or SOB. No fevers. No melena. No focal symptoms. Spoke with his neurologist who advised presenting to [facility] for admission for possible myasthenia flare but patient opted to come here as much closer to his home. No new orthopnea or LE swelling. Thinks has been eating and drinking a bit less than normal 2/2 fatigue and trouble chewing and swallowing.   ED Course: labs, 500 cc NS, neurology consult ([facility])  Review of Systems: As per HPI otherwise 10 point review of systems negative.    Past Medical History:  Diagnosis Date  . CN (constipation)   . Elevated transaminase level   . History of measles   . Prostate cancer [facility])    Prostatectomy 2001    Past Surgical History:  Procedure Laterality Date  . APPENDECTOMY  1955  . HERNIA REPAIR Right 2006   Dr. Jamal Collin  . PROSTATECTOMY  2001   Dr. Yves Dill     reports that he has quit smoking. His smoking use included cigars. He quit after 10.00 years of use. His smokeless tobacco use includes chew. He reports that he does not drink alcohol or use drugs.  Allergies  Allergen Reactions  . Atorvastatin Other (See Comments)    Elevated liver functions ("liver disorder")  . Sulfa Antibiotics Rash    Family History  Problem Relation Age of Onset  . Heart disease Mother   . Colon cancer Father    . Obesity Sister     Prior to Admission medications   Medication Sig Start Date End Date Taking? Authorizing Provider  aspirin (ASPIRIN LOW DOSE) 81 MG EC tablet Take 81 mg by mouth daily.    Yes [provider]  levothyroxine (SYNTHROID, LEVOTHROID) 50 MCG tablet TAKE 1 TABLET BY MOUTH  DAILY 03/31/18  Yes [name], MD  lisinopril-hydrochlorothiazide (PRINZIDE,ZESTORETIC) 10-12.5 MG tablet TAKE 1 TABLET BY MOUTH  DAILY 03/31/18  Yes [name], MD  pyridostigmine (MESTINON) 60 MG tablet Take 60 mg by mouth 4 (four) times daily. 05/01/18  Yes [provider]  rosuvastatin (CRESTOR) 20 MG tablet TAKE 1 TABLET BY MOUTH  DAILY 03/31/18  Yes [name], MD  UNABLE TO FIND "Ice Breakers mints": Dissolve 1 mint in the mouth as needed for dry throat   Yes [provider]  mycophenolate (CELLCEPT) 250 MG capsule Take 250 mg by mouth See admin instructions. Take 250 mg by mouth once a day for 1 week, then increase to 250 mg two times a day 05/02/18   [provider]    Physical Exam: Vitals:   05/02/18 1555 05/02/18 1700 05/02/18 1800 05/02/18 1815  BP:  117/66 125/73 111/62  Pulse:  60 60 (!) 55  Resp:  20 18 (!) 26  Temp:      SpO2:  100% 98% 99%  Weight: 63.5 kg (140 lb)  Height: 5\' 8"  (1.727 m)       Constitutional: No acute distress Head: Atraumatic Eyes: Conjunctiva clear ENM: dry mucous membranes. Missing most teeth.  Neck: Supple Respiratory: Clear to auscultation bilaterally, no wheezing/rales/rhonchi. Normal respiratory effort. No accessory muscle use. . Cardiovascular: distant heart sounds. Moderate systolic murmur high pitched. Abdomen: Non-tender, non-distended. No masses. No rebound or guarding. Positive bowel sounds. Musculoskeletal: No joint deformity upper and lower extremities. Normal ROM, no contractures. Normal muscle tone.  Skin: No rashes, lesions, or ulcers.  Extremities: No peripheral edema. Palpable peripheral  pulses. Neurologic: Alert, hard of hearing. 4+/5 upper and lower strength. Distal sensation intact.  Psychiatric: Normal insight and judgement.   Labs on Admission: I have personally reviewed following labs and imaging studies  CBC: Recent Labs  Lab 05/02/18 1559  WBC 5.8  HGB 14.2  HCT 43.6  MCV 94.4  PLT 633   Basic Metabolic Panel: Recent Labs  Lab 05/02/18 1559  NA 140  K 4.1  CL 103  CO2 25  GLUCOSE 110*  BUN 53*  CREATININE 3.57*  CALCIUM 9.8   GFR: Estimated Creatinine Clearance: 13.1 mL/min (A) (by C-G formula based on SCr of 3.57 mg/dL (H)). Liver Function Tests: No results for input(s): AST, ALT, ALKPHOS, BILITOT, PROT, ALBUMIN in the last 168 hours. No results for input(s): LIPASE, AMYLASE in the last 168 hours. No results for input(s): AMMONIA in the last 168 hours. Coagulation Profile: No results for input(s): INR, PROTIME in the last 168 hours. Cardiac Enzymes: Recent Labs  Lab 05/02/18 1559  TROPONINI 0.03*   BNP (last 3 results) No results for input(s): PROBNP in the last 8760 hours. HbA1C: No results for input(s): HGBA1C in the last 72 hours. CBG: No results for input(s): GLUCAP in the last 168 hours. Lipid Profile: No results for input(s): CHOL, HDL, LDLCALC, TRIG, CHOLHDL, LDLDIRECT in the last 72 hours. Thyroid Function Tests: No results for input(s): TSH, T4TOTAL, FREET4, T3FREE, THYROIDAB in the last 72 hours. Anemia Panel: No results for input(s): VITAMINB12, FOLATE, FERRITIN, TIBC, IRON, RETICCTPCT in the last 72 hours. Urine analysis:    Component Value Date/Time   COLORURINE YELLOW 05/02/2018 1750   APPEARANCEUR HAZY (A) 05/02/2018 1750   LABSPEC 1.018 05/02/2018 1750   PHURINE 5.0 05/02/2018 1750   GLUCOSEU NEGATIVE 05/02/2018 1750   HGBUR NEGATIVE 05/02/2018 1750   BILIRUBINUR NEGATIVE 05/02/2018 1750   KETONESUR NEGATIVE 05/02/2018 1750   PROTEINUR NEGATIVE 05/02/2018 1750   NITRITE NEGATIVE 05/02/2018 1750    LEUKOCYTESUR NEGATIVE 05/02/2018 1750    Radiological Exams on Admission: Dg Chest 2 View  Result Date: 05/02/2018 CLINICAL DATA:  Shortness of breath EXAM: CHEST - 2 VIEW COMPARISON:  Chest CT April 16, 2018 FINDINGS: There is a calcified granuloma in the right mid lung. There is no evident edema or consolidation. The heart size and pulmonary vascularity are normal. No adenopathy. There is aortic atherosclerosis. There is calcification in the aortic valve region. There is degenerative change in the thoracic spine. IMPRESSION: Calcified granuloma right mid lung.  No edema or consolidation. There is aortic atherosclerosis. Heart size normal. Calcification in the aortic valve raises concern for potential degree of aortic stenosis. Aortic Atherosclerosis (ICD10-I70.0). Electronically Signed   By: Lowella Grip III M.D.   On: 05/02/2018 16:30   Assessment/Plan Active Problems:   Aortic stenosis with mitral and aortic insufficiency   CKD (chronic kidney disease), stage III (HCC)   Coronary artery disease involving native heart   Hypertension  Hypothyroid   Myasthenia gravis ([place])   AKI (acute kidney injury) ([place])   # Myasthenia gravis exacerbation - with increasing weakness, fatigue, and trouble swallowing. No signs significant respiratory distress. No symptoms aspiration pneumonia and cxr clear. Neurology [facility]) consulted and will evaluate pt including for whether plasma exchange indicated. - appreciate neurology recs - npo for now - d5 1/2 ns @ 100 - slp swallow eval - continue home pyridostigmine for now (hasn't started recently prescribed cellcept) - stepdown for now  # AKI on ckd 3/4 - cr 3.57 on admit from last known 1.99. Likely prerenal from reduced po - s/p 500 cc bolus, on fluids as above. Gentle hydration given known severe as - am bmp  # Aortic stenosis, severe - no signs acute heart failure - gentle hydration as above  # Troponin elevation - mild at 0.3. No chest  pain. ekg with stable first degree av block. Likely elevated 2/2 aki - repeat am troponin - cardiac monitoring while in stepdown  # hypothyroid - f/u tsh - continue home levothyroxine  # htn - here bp wnl - hold home lisin/hctz given aki - ctm    DVT prophylaxis: heparin, scds Code Status: dnr confirmed w/ pt and family   Family Communication: niece fara brogden [phone]  Disposition Plan: tbd  Consults called: [place] neurology  Admission status: step-down    Desma Maxim MD [facility] Pager [phone]  If 7PM-7AM, please contact night-coverage [email or web address] Password [facility]  05/02/2018, 7:28 PM

## 2018-05-02 NOTE — Consult Note (Addendum)
Requesting Physician: Dr. Lindell Noe    Chief Complaint: Myasthenia Gravis, increased weakness, double vision, shortness of breath   History obtained from: Patient and Chart    HPI:                                                                                                                                       Jason Riley is an 84 y.o. male with past medical history of seropositive myasthenia gravis diagnosed in January 2019, prostate cancer presents to [facility] emergency room for worsening symptoms of weakness, double vision and shortness of breath on exertion as well as worsening dysphagia.  He sees Dr. Melrose Nakayama at [facility] for his myasthenia gravis who has the patient on CellCept, and Mestinon 4 times daily.  He had recommended the patient come to the hospital for plasma exchange due to progressive symptoms.  Patient decided to get admitted at [facility] as it was more convenient and closer than [facility].  Currently the patient feels okay while sitting in bed and denies any shortness of breath.  States that his he has double vision throughout the day.  Also has been progressively having difficulty with swallowing.   Past Medical History:  Diagnosis Date  . CN (constipation)   . Elevated transaminase level   . History of measles   . Prostate cancer [facility])    Prostatectomy 2001    Past Surgical History:  Procedure Laterality Date  . APPENDECTOMY  1955  . HERNIA REPAIR Right 2006   Dr. Jamal Collin  . PROSTATECTOMY  2001   Dr. Yves Dill    Family History  Problem Relation Age of Onset  . Heart disease Mother   . Colon cancer Father   . Obesity Sister    Social History:  reports that he has quit smoking. His smoking use included cigars. He quit after 10.00 years of use. His smokeless tobacco use includes chew. He reports that he does not drink alcohol or use drugs.  Allergies:  Allergies  Allergen Reactions  . Atorvastatin Other (See Comments)    Elevated liver functions ("liver  disorder")  . Sulfa Antibiotics Rash    Medications:                                                                                                                        I reviewed home medications   ROS:  14 systems reviewed and negative except above    Examination:                                                                                                      General: Appears well-developed and well-nourished.  Psych: Affect appropriate to situation Eyes: No scleral injection HENT: No OP obstrucion Head: Normocephalic.  Cardiovascular: Normal rate and regular rhythm.  Respiratory: Effort normal and breath sounds normal to anterior ascultation.  Single breath count greater than 20.  No respiratory distress noted GI: Soft.  No distension. There is no tenderness.  Skin: WDI    Neurological Examination Mental Status: Alert, oriented, thought content appropriate.  Speech fluent without evidence of aphasia. Able to follow 3 step commands without difficulty. Cranial Nerves: II: Visual fields grossly normal,  III,IV, VI: Mild left eye ptosis  present, extra-ocular motions intact bilaterally, pupils equal, round, reactive to light and accommodation V,VII: smile symmetric, facial light touch sensation normal bilaterally VIII: hearing normal bilaterally IX,X: uvula rises symmetrically XI: bilateral shoulder shrug, neck flexors show good strength XII: midline tongue extension Motor: Right : Upper extremity   5/5    Left:     Upper extremity   5/5  Lower extremity   5/5     Lower extremity   5/5 Tone and bulk:normal tone throughout; no atrophy noted Sensory: Pinprick and light touch intact throughout, bilaterally Deep Tendon Reflexes: 2+ and symmetric throughout Plantars: Right: downgoing   Left: downgoing Cerebellar: normal finger-to-nose, normal  rapid alternating movements and normal heel-to-shin test Gait: normal gait and station     Lab Results: Basic Metabolic Panel: Recent Labs  Lab 05/02/18 1559  NA 140  K 4.1  CL 103  CO2 25  GLUCOSE 110*  BUN 53*  CREATININE 3.57*  CALCIUM 9.8    CBC: Recent Labs  Lab 05/02/18 1559  WBC 5.8  HGB 14.2  HCT 43.6  MCV 94.4  PLT 171    Coagulation Studies: No results for input(s): LABPROT, INR in the last 72 hours.  Imaging: Dg Chest 2 View  Result Date: 05/02/2018 CLINICAL DATA:  Shortness of breath EXAM: CHEST - 2 VIEW COMPARISON:  Chest CT April 16, 2018 FINDINGS: There is a calcified granuloma in the right mid lung. There is no evident edema or consolidation. The heart size and pulmonary vascularity are normal. No adenopathy. There is aortic atherosclerosis. There is calcification in the aortic valve region. There is degenerative change in the thoracic spine. IMPRESSION: Calcified granuloma right mid lung.  No edema or consolidation. There is aortic atherosclerosis. Heart size normal. Calcification in the aortic valve raises concern for potential degree of aortic stenosis. Aortic Atherosclerosis (ICD10-I70.0). Electronically Signed   By: Lowella Grip III M.D.   On: 05/02/2018 16:30     I have reviewed the above imaging : No imaging was performed   ASSESSMENT AND PLAN   84 y.o. male with past medical history of seropositive myasthenia gravis diagnosed in January 2019, prostate cancer presents to [facility] emergency room for worsening symptoms of weakness, double vision  and shortness of breath on exertion as well as worsening dysphagia.    Myasthenia gravis exacerbation -Patient does not show any signs of crisis -Single breath count greater than 20, no weakness on maintaining upward gaze -Admitted for 5 days of plasmapheresis, will arrange tomorrow -CBC/BMP within normal limits, no signs of infection to explain worsening myasthenia -Continue CellCept,  Mestinon  Shortness of breath on exertion -Rule out other causes for dyspnea, chest x-ray and consider echo and BNP  Quinlyn Tep [facility] Pager Number [phone]  Admitted for plasma exchange for myasthenia excerbation. Not in crisis. Will working on get PLEX arranged for tomorrow. Also recommend workup for other causes for dyspnea on exertion.

## 2018-05-02 NOTE — Progress Notes (Signed)
Pt has arrived to 56w 03 from ED. His niece and her husband at the bedside. Pt identified appropriately using two identifiers. VS stable, denied chest pain and SOB at rest. No signs of acute distress.  Pt placed on cardiac monitor, CCMD notified and verified by second person , physical assessment completed. Pt instructed to use call bell for assistance, verbalized understanding. Bed alarm in place. Will continue to monitor and treat pt per MD orders.

## 2018-05-03 ENCOUNTER — Inpatient Hospital Stay (HOSPITAL_COMMUNITY): Payer: Medicare Other

## 2018-05-03 ENCOUNTER — Encounter (HOSPITAL_COMMUNITY): Payer: Self-pay | Admitting: Interventional Radiology

## 2018-05-03 DIAGNOSIS — I340 Nonrheumatic mitral (valve) insufficiency: Secondary | ICD-10-CM

## 2018-05-03 DIAGNOSIS — G7000 Myasthenia gravis without (acute) exacerbation: Secondary | ICD-10-CM

## 2018-05-03 HISTORY — PX: IR US GUIDE VASC ACCESS RIGHT: IMG2390

## 2018-05-03 HISTORY — PX: IR FLUORO GUIDE CV LINE LEFT: IMG2282

## 2018-05-03 LAB — BASIC METABOLIC PANEL
ANION GAP: 10 (ref 5–15)
BUN: 50 mg/dL — AB (ref 8–23)
CALCIUM: 8.9 mg/dL (ref 8.9–10.3)
CHLORIDE: 105 mmol/L (ref 98–111)
CO2: 23 mmol/L (ref 22–32)
Creatinine, Ser: 3.17 mg/dL — ABNORMAL HIGH (ref 0.61–1.24)
GFR calc Af Amer: 19 mL/min — ABNORMAL LOW (ref >60)
GFR calc non Af Amer: 16 mL/min — ABNORMAL LOW (ref >60)
GLUCOSE: 105 mg/dL — AB (ref 70–99)
POTASSIUM: 3.7 mmol/L (ref 3.5–5.1)
Sodium: 138 mmol/L (ref 135–145)

## 2018-05-03 LAB — APTT: aPTT: 45 seconds — ABNORMAL HIGH (ref 24–36)

## 2018-05-03 LAB — ECHOCARDIOGRAM COMPLETE
Height: 69 in
WEIGHTICAEL: 2285.73 [oz_av]

## 2018-05-03 LAB — PROTIME-INR
INR: 1.15
Prothrombin Time: 14.6 seconds (ref 11.4–15.2)

## 2018-05-03 LAB — D-DIMER, QUANTITATIVE: D-Dimer, Quant: 2.84 ug/mL-FEU — ABNORMAL HIGH (ref 0.00–0.50)

## 2018-05-03 LAB — TROPONIN I: Troponin I: 0.04 ng/mL (ref <0.03)

## 2018-05-03 LAB — MRSA PCR SCREENING: MRSA BY PCR: NEGATIVE

## 2018-05-03 LAB — TSH: TSH: 2.1 u[IU]/mL (ref 0.350–4.500)

## 2018-05-03 LAB — BRAIN NATRIURETIC PEPTIDE: B Natriuretic Peptide: 156.3 pg/mL — ABNORMAL HIGH (ref 0.0–100.0)

## 2018-05-03 MED ORDER — CALCIUM CARBONATE ANTACID 500 MG PO CHEW
2.0000 | CHEWABLE_TABLET | ORAL | Status: AC
  Filled 2018-05-03: qty 2

## 2018-05-03 MED ORDER — ACETAMINOPHEN 325 MG PO TABS: 650.0000 mg | ORAL_TABLET | ORAL | Status: DC | PRN

## 2018-05-03 MED ORDER — ACD FORMULA A 0.73-2.45-2.2 GM/100ML VI SOLN
500.0000 mL | Status: DC
  Administered 2018-05-03 (×2): 500 mL via INTRAVENOUS
  Filled 2018-05-03: qty 500

## 2018-05-03 MED ORDER — CALCIUM CARBONATE ANTACID 500 MG PO CHEW: 2.0000 | CHEWABLE_TABLET | ORAL | Status: DC

## 2018-05-03 MED ORDER — SODIUM CHLORIDE 0.9 % IV BOLUS: 1000.0000 mL | Freq: Once | INTRAVENOUS | Status: DC

## 2018-05-03 MED ORDER — DIPHENHYDRAMINE HCL 25 MG PO CAPS: 25.0000 mg | ORAL_CAPSULE | Freq: Four times a day (QID) | ORAL | Status: DC | PRN

## 2018-05-03 MED ORDER — ACD FORMULA A 0.73-2.45-2.2 GM/100ML VI SOLN
Status: AC
  Administered 2018-05-03: 500 mL via INTRAVENOUS
  Filled 2018-05-03: qty 500

## 2018-05-03 MED ORDER — SODIUM CHLORIDE 0.9 % IV SOLN
INTRAVENOUS | Status: AC
  Administered 2018-05-03 (×3): via INTRAVENOUS_CENTRAL
  Filled 2018-05-03 (×3): qty 200

## 2018-05-03 MED ORDER — SODIUM CHLORIDE 0.9 % IV SOLN: 2.0000 g | Freq: Once | INTRAVENOUS | Status: DC

## 2018-05-03 MED ORDER — HEPARIN SODIUM (PORCINE) 1000 UNIT/ML IJ SOLN: 1000.0000 [IU] | Freq: Once | INTRAMUSCULAR | Status: DC

## 2018-05-03 MED ORDER — SODIUM CHLORIDE 0.9 % IV SOLN
Freq: Once | INTRAVENOUS | Status: AC
  Administered 2018-05-03: 20:00:00 via INTRAVENOUS_CENTRAL
  Filled 2018-05-03: qty 200

## 2018-05-03 MED ORDER — HEPARIN SODIUM (PORCINE) 1000 UNIT/ML IJ SOLN
INTRAMUSCULAR | Status: AC
  Filled 2018-05-03: qty 1

## 2018-05-03 MED ORDER — ACD FORMULA A 0.73-2.45-2.2 GM/100ML VI SOLN
500.0000 mL | Status: DC
  Administered 2018-05-05: 500 mL via INTRAVENOUS
  Filled 2018-05-03: qty 500

## 2018-05-03 MED ORDER — SODIUM CHLORIDE 0.9 % IV SOLN
2.0000 g | Freq: Once | INTRAVENOUS | Status: AC
  Administered 2018-05-03: 2 g via INTRAVENOUS
  Filled 2018-05-03: qty 20

## 2018-05-03 MED ORDER — SODIUM CHLORIDE 0.9 % IV SOLN: Freq: Once | INTRAVENOUS | Status: DC

## 2018-05-03 MED ORDER — HEPARIN SODIUM (PORCINE) 1000 UNIT/ML IJ SOLN
1000.0000 [IU] | Freq: Once | INTRAMUSCULAR | Status: DC
  Filled 2018-05-03: qty 1

## 2018-05-03 MED ORDER — LIDOCAINE HCL 1 % IJ SOLN
INTRAMUSCULAR | Status: AC
  Filled 2018-05-03: qty 20

## 2018-05-03 MED ORDER — LIDOCAINE HCL 1 % IJ SOLN
INTRAMUSCULAR | Status: DC | PRN
  Administered 2018-05-03: 10 mL

## 2018-05-03 MED ORDER — NICOTINE 14 MG/24HR TD PT24
14.0000 mg | MEDICATED_PATCH | Freq: Every day | TRANSDERMAL | Status: DC
  Filled 2018-05-03: qty 1

## 2018-05-03 NOTE — Progress Notes (Signed)
RT attempted 2x to obtain NIF/VC from patient but both times patient was not in room. RT will attempt again later when patient is back.

## 2018-05-03 NOTE — Progress Notes (Signed)
Echocardiogram 2D Echocardiogram has been performed.  Matilde Bash 05/03/2018, 1:53 PM

## 2018-05-03 NOTE — Progress Notes (Signed)
RT NOTE: Patient achieved NIF of greater than -40 and VC of 2.1L. Patient performed 3 attempts on each with great patient effort. Vitals are stable. RT will continue to monitor.

## 2018-05-03 NOTE — Progress Notes (Signed)
Subjective: Patient is awake alert oriented x3 in bed.  Admits to feeling dry and thirsty, patient currently n.p.o. for tunneled cath.  He denies fatigue at this time as he has not been able to get out of bed but does admit to fatigue with repetitive movements.  Patient denies chest pain, shortness of breath at this time.  He admits to persistent double vision, denies ocular pain or associated headache or lightheadedness. Patient is aware of pending tunnel cath and plasma exchange.  Exam: Vitals:   05/03/18 0736 05/03/18 1033  BP: (!) 115/56 (!) 116/57  Pulse: (!) 53 (!) 54  Resp: 14 (!) 24  Temp: 97.9 F (36.6 C)   SpO2: 98% 96%    Physical Exam  HEENT-  Normocephalic, no lesions, without obvious abnormality.  Normal external eye and conjunctiva.   Cardiovascular- S1-S2 audible, pulses palpable throughout   Lungs-no rhonchi or wheezing noted, normal respiration effort, single breath count 22  Saturations within normal limits Abdomen- All 4 quadrants palpated and nontender Musculoskeletal-no joint tenderness, deformity or swelling Skin-warm and dry, no hyperpigmentation, vitiligo, or suspicious lesions  Neuro:  Mental Status: Alert, oriented, thought content appropriate.  Speech fluent without evidence of aphasia.  Able to follow 3 step commands without difficulty. Cranial Nerves: II:  Visual fields grossly normal,  III,IV, VI: ptosis not present, saccadic eye movement bilaterally, more pronounced on side ot side ocular movement. extra-ocular motions intact bilaterally pupils equal, round, reactive to light and accommodation V,VII: smile symmetric, facial light touch sensation normal bilaterally VIII: hearing intact to voice IX,X: uvula rises symmetrically XI: bilateral shoulder shrug XII: midline tongue extension Motor: Right : Upper extremity   5/5    Left:     Upper extremity   5/5  Lower extremity   5/5     Lower extremity   5/5 Tone and bulk:normal tone throughout; no  atrophy noted Sensory: intact  bilaterally Deep Tendon Reflexes: 2+ and symmetric throughout Plantars: Right: downgoing   Left: downgoing Gait: normal gait and station    Medications:  . levothyroxine  50 mcg Oral Daily  . nicotine  14 mg Transdermal Daily  . pyridostigmine  60 mg Oral QID    Pertinent Labs/Diagnostics:   Dg Chest 2 View  05/02/2018 IMPRESSION: Calcified granuloma right mid lung.  No edema or consolidation. There is aortic atherosclerosis. Heart size normal. Calcification in the aortic valve raises concern for potential degree of aortic stenosis.   Assessment:  84 y.o. male with past medical history of seropositive myasthenia gravis diagnosed in January 2019, prostate cancer presents to [facility] emergency room for worsening symptoms of weakness, double vision and shortness of breath on exertion as well as worsening dysphagia.  1. Myasthenia Gravis Exacerbation Patient to continue on Mestinon, he did not begin the CellCept ordered by his neurologist at [facility] -He does not appear to be in crisis at this time, with normal respirations, normal speech, moves extremities equally at this time, but does admit to fatigue with repetitive movements.  IR for tunneled Cath today for plasma exhange 2.  Severe aortic stenosis-echo pending 3.  Shortness of breath-patient denies dyspnea at this a.m., chest x-ray did not show any acute processes except for calcified granuloma and Aortic Stenosis 4. Dysphagia 5. AKI on CKD stage 3/4 6. Hypothyroid  Recommendations: -NPO currently for IR Tunneled cath for plasma exchange x5 days, beginning today - Speech therapy eval -Continue Mestignon -Monitor for improvement post plasma exchange - Avoid Nephrotoxic drugs  Gayla Benn  Nakeshia Waldeck DNP [facility] Team [phone] 05/03/2018, 11:26 AM

## 2018-05-03 NOTE — Evaluation (Signed)
Clinical/Bedside Swallow Evaluation Patient Details  Name: Jason Riley MRN: [id number] Date of Birth: 1929-03-29  Today's Date: 05/03/2018 Time: SLP Start Time (ACUTE ONLY): 1962 SLP Stop Time (ACUTE ONLY): 0950 SLP Time Calculation (min) (ACUTE ONLY): 22 min  Past Medical History:  Past Medical History:  Diagnosis Date  . CN (constipation)   . Elevated transaminase level   . History of measles   . Prostate cancer [place])    Prostatectomy 2001   Past Surgical History:  Past Surgical History:  Procedure Laterality Date  . APPENDECTOMY  1955  . HERNIA REPAIR Right 2006   Dr. Jamal Collin  . PROSTATECTOMY  2001   Dr. Yves Dill   HPI:  Jason Riley is a 84 y.o. male with medical history significant for recently diagnosed myasthenia gravis (followed by [facility]), prostate cancer s/p prostatectomy 2001, severe AS (seen on 2015 TTE). Symptoms have been worsening for a month or two. Generalized weakness. Once fell and really struggled to get back up. Having continued double vision. Also occasional choking. Lots of unsteadiness. No chest pain or SOB. No fevers. No melena. No focal symptoms. Spoke with his neurologist who advised presenting to [facility] for admission for possible myasthenia flare but patient opted to come here as much closer to his home. No new orthopnea or LE swelling. Thinks has been eating and drinking a bit less than normal 2/2 fatigue and trouble chewing and swallowing.    Assessment / Plan / Recommendation Clinical Impression  Patient was able to answer all biographical questions and give a complete medical history. He did report difficulty drinking his water from a jar (kept in refrigerator) at times. Discussed this may be a volume problem rather than a liquid consistency problem. Recommened pouring water in cup and patient prefers straws. Trials of ice chips, thin liquids by cup and straw, pureed and cracker. NO coughing, clearing or respiratory changes during presentation. Patient has no  teeth and unable to wear dentures therefore he reports limiting his meats to chicken and hamburger due to difficulty chewing. Recommend a Dys 3 (mechanical soft diet), thin liquids and medication given whole with liquids. Will follow up to ensure patient is tolerating diet.  SLP Visit Diagnosis: Dysphagia, oropharyngeal phase (R13.12)    Aspiration Risk  Mild aspiration risk    Diet Recommendation Dysphagia 3 (Mech soft);Thin liquid   Liquid Administration via: Cup;Straw Medication Administration: Whole meds with liquid Supervision: Patient able to self feed Compensations: Follow solids with liquid Postural Changes: Seated upright at 90 degrees;Remain upright for at least 30 minutes after po intake    Other  Recommendations Oral Care Recommendations: Oral care BID   Follow up Recommendations        Frequency and Duration min 1 x/week  1 week       Prognosis Prognosis for Safe Diet Advancement: Good      Swallow Study   General Date of Onset: 05/02/18 HPI: Jason Riley is a 84 y.o. male with medical history significant for recently diagnosed myasthenia gravis (followed by [facility]), prostate cancer s/p prostatectomy 2001, severe AS (seen on 2015 TTE). Symptoms have been worsening for a month or two. Generalized weakness. Once fell and really struggled to get back up. Having continued double vision. Also occasional choking. Lots of unsteadiness. No chest pain or SOB. No fevers. No melena. No focal symptoms. Spoke with his neurologist who advised presenting to [facility] for admission for possible myasthenia flare but patient opted to come here as much closer to  his home. No new orthopnea or LE swelling. Thinks has been eating and drinking a bit less than normal 2/2 fatigue and trouble chewing and swallowing.  Type of Study: Bedside Swallow Evaluation Previous Swallow Assessment: no previous swallow assessent Diet Prior to this Study: Dysphagia 3 (soft);Thin liquids Temperature Spikes Noted:  No Respiratory Status: Room air History of Recent Intubation: No Behavior/Cognition: Alert;Cooperative;Pleasant mood Oral Cavity Assessment: Within Functional Limits Oral Care Completed by SLP: No Oral Cavity - Dentition: Edentulous Vision: Functional for self-feeding(however he has double vision from myathinis gravis) Self-Feeding Abilities: Able to feed self Patient Positioning: Upright in bed Baseline Vocal Quality: Normal Volitional Cough: Strong Volitional Swallow: Able to elicit    Oral/Motor/Sensory Function Overall Oral Motor/Sensory Function: Within functional limits   Ice Chips Ice chips: Within functional limits Presentation: Spoon   Thin Liquid Thin Liquid: Within functional limits Presentation: Cup;Straw    Nectar Thick Nectar Thick Liquid: Not tested   Honey Thick Honey Thick Liquid: Not tested   Puree Puree: Within functional limits Presentation: Self Fed   Solid   GO   Solid: Within functional limits Presentation: [place], MA, CCC-SLP 05/03/2018 10:11 AM

## 2018-05-03 NOTE — Progress Notes (Signed)
RT checked X's 2 to obatin pt's VC and NIF. Pt currently in hemodialysis.

## 2018-05-03 NOTE — Procedures (Signed)
Interventional Radiology Procedure Note  Procedure: Right IJ non-tunneled triple lumen HD catheter  Complications: None  Estimated Blood Loss: None  Recommendations: - Routine line care  Signed,  Criselda Peaches, MD

## 2018-05-03 NOTE — Progress Notes (Addendum)
PROGRESS NOTE  BRACH BIRDSALL RCV:[id number] DOB: Aug 28, 1929 DOA: 05/02/2018 PCP: [name], MD  HPI/Recap of past 24 hours:  Continue to complain seeing double  Short of breath, denies pain, denies difficulty swallowing  No fever  Assessment/Plan: Active Problems:   Aortic stenosis with mitral and aortic insufficiency   CKD (chronic kidney disease), stage III (HCC)   Coronary artery disease involving native heart   Hypertension   Hypothyroid   Myasthenia gravis ([place])   AKI (acute kidney injury) ([place])   Myasthenia gravis exacerbation -presented with increasing weakness, fatigue, seeing double and trouble swallowing. No sob. --Swallow eval recommended dysphagia 3 diet/thin liquid -Respiratory therapist to monitor NIF --Right IJ non-tunneled triple lumen HD catheter placed today for plasmapheresis per neurology recommendation -Continue pyridostigmine per neurology recommendation --Per [facility] urology notes , patient is recommended to start on CellCept , however he has not picked up this medication yet  -We will leave it to neurology to decide on CellCept , neurology input appreciated   AKI on CKD 3 -From dehydration, UA unremarkable /renal ultrasound no hydronephrosis  -Continue hydration, repeat in a.m. -Home meds lisinopril HCTZ held  -renal dosing meds  HTN: BP stable without home blood pressure medication  aortic stenosis -Denies chest pain, syncope episode -per Chart review he declined aortic valve repair -Troponin on presentation mild and flat at 0.03 and 0.04.  EKG with lateral lead ST depression -Echocardiogram with normal wall motion, normal left ventricular EF.  He does has mild LVH, grade 1 diastolic dysfunction.  Severe aortic valve stenosis -Prevent dehydration as patient is preload dependent  Hypothyroidism -TSH 2.1, continue home meds   Code Status: DNR  Family Communication: patient   Disposition Plan: not ready to  discharge   Consultants:  Neurology  IR  Procedures: Right IJ non-tunneled triple lumen HD catheter by IR  Antibiotics:  none   Objective: BP (!) 110/48   Pulse (!) 54   Temp 98.1 F (36.7 C) (Oral)   Resp 19   Ht 5\' 9"  (1.753 m)   Wt 64.8 kg (142 lb 13.7 oz)   SpO2 96%   BMI 21.10 kg/m   Intake/Output Summary (Last 24 hours) at 05/03/2018 0705 Last data filed at 05/03/2018 0420 Gross per 24 hour  Intake 881.44 ml  Output 350 ml  Net 531.44 ml   Filed Weights   05/02/18 1555 05/02/18 2130  Weight: 63.5 kg (140 lb) 64.8 kg (142 lb 13.7 oz)    Exam: Patient is examined daily including today on 05/03/2018, exams remain the same as of yesterday except that has changed    General:  Thin, Frail, chronically ill, hard of hearing but NAD  Cardiovascular: RRR, + systolic murmur  Respiratory: CTABL  Abdomen: Soft/ND/NT, positive BS  Musculoskeletal: No Edema  Neuro: alert, oriented   Data Reviewed: Basic Metabolic Panel: Recent Labs  Lab 05/02/18 1559 05/03/18 0444  NA 140 138  K 4.1 3.7  CL 103 105  CO2 25 23  GLUCOSE 110* 105*  BUN 53* 50*  CREATININE 3.57* 3.17*  CALCIUM 9.8 8.9   Liver Function Tests: No results for input(s): AST, ALT, ALKPHOS, BILITOT, PROT, ALBUMIN in the last 168 hours. No results for input(s): LIPASE, AMYLASE in the last 168 hours. No results for input(s): AMMONIA in the last 168 hours. CBC: Recent Labs  Lab 05/02/18 1559  WBC 5.8  HGB 14.2  HCT 43.6  MCV 94.4  PLT 171   Cardiac Enzymes:  Recent Labs  Lab 05/02/18 1559 05/02/18 1857 05/03/18 0444  TROPONINI 0.03* 0.04* 0.04*   BNP (last 3 results) Recent Labs    05/03/18 0444  BNP 156.3*    ProBNP (last 3 results) No results for input(s): PROBNP in the last 8760 hours.  CBG: No results for input(s): GLUCAP in the last 168 hours.  Recent Results (from the past 240 hour(s))  MRSA PCR Screening     Status: None   Collection Time: 05/02/18 11:17 PM   Result Value Ref Range Status   MRSA by PCR NEGATIVE NEGATIVE Final    Comment:        The GeneXpert MRSA Assay (FDA approved for NASAL specimens only), is one component of a comprehensive MRSA colonization surveillance program. It is not intended to diagnose MRSA infection nor to guide or monitor treatment for MRSA infections. Performed at [facility], [address], [place], [place] [address]      Studies: Dg Chest 2 View  Result Date: 05/02/2018 CLINICAL DATA:  Shortness of breath EXAM: CHEST - 2 VIEW COMPARISON:  Chest CT April 16, 2018 FINDINGS: There is a calcified granuloma in the right mid lung. There is no evident edema or consolidation. The heart size and pulmonary vascularity are normal. No adenopathy. There is aortic atherosclerosis. There is calcification in the aortic valve region. There is degenerative change in the thoracic spine. IMPRESSION: Calcified granuloma right mid lung.  No edema or consolidation. There is aortic atherosclerosis. Heart size normal. Calcification in the aortic valve raises concern for potential degree of aortic stenosis. Aortic Atherosclerosis (ICD10-I70.0). Electronically Signed   By: Lowella Grip III M.D.   On: 05/02/2018 16:30    Scheduled Meds: . heparin  5,000 Units Subcutaneous Q8H  . levothyroxine  50 mcg Oral Daily  . pyridostigmine  60 mg Oral QID    Continuous Infusions: . dextrose 5 % and 0.45% NaCl 100 mL/hr at 05/02/18 2251     Time spent: 2mins I have personally reviewed and interpreted on  05/03/2018 daily labs, tele strips, imagings as discussed above under date review session and assessment and plans.  I reviewed all nursing notes, pharmacy notes, consultant notes,  vitals, pertinent old records  I have discussed plan of care as described above with RN , patient  on 05/03/2018   Florencia Reasons MD, PhD  [facility] Pager [phone]. If 7PM-7AM, please contact night-coverage at [email or web address], password  [facility] 05/03/2018, 7:05 AM  LOS: 1 day

## 2018-05-04 ENCOUNTER — Other Ambulatory Visit: Payer: Self-pay

## 2018-05-04 LAB — COMPREHENSIVE METABOLIC PANEL
ALBUMIN: 3.6 g/dL (ref 3.5–5.0)
ALT: 16 U/L (ref 0–44)
ANION GAP: 4 — AB (ref 5–15)
AST: 21 U/L (ref 15–41)
Alkaline Phosphatase: 19 U/L — ABNORMAL LOW (ref 38–126)
BILIRUBIN TOTAL: 0.7 mg/dL (ref 0.3–1.2)
BUN: 31 mg/dL — ABNORMAL HIGH (ref 8–23)
CO2: 22 mmol/L (ref 22–32)
Calcium: 8.5 mg/dL — ABNORMAL LOW (ref 8.9–10.3)
Chloride: 115 mmol/L — ABNORMAL HIGH (ref 98–111)
Creatinine, Ser: 2.16 mg/dL — ABNORMAL HIGH (ref 0.61–1.24)
GFR calc Af Amer: 30 mL/min — ABNORMAL LOW (ref >60)
GFR calc non Af Amer: 26 mL/min — ABNORMAL LOW (ref >60)
GLUCOSE: 119 mg/dL — AB (ref 70–99)
POTASSIUM: 4.1 mmol/L (ref 3.5–5.1)
Sodium: 141 mmol/L (ref 135–145)
TOTAL PROTEIN: 4.8 g/dL — AB (ref 6.5–8.1)

## 2018-05-04 LAB — CORTISOL: CORTISOL PLASMA: 10.1 ug/dL

## 2018-05-04 NOTE — Progress Notes (Signed)
RT NOTE: Patient achieved NIF of -40 and VC of 2.0L with great patient effort. Vitals are stable. RT will continue to monitor.

## 2018-05-04 NOTE — Evaluation (Signed)
Physical Therapy Evaluation Patient Details Name: Jason Riley MRN: [id number] DOB: 1929-01-07 Today's Date: 05/04/2018   History of Present Illness  84 y.o. male with past medical history of seropositive myasthenia gravis diagnosed in January 2019, prostate cancer presents to [facility] emergency room for worsening symptoms of weakness, double vision and shortness of breath on exertion as well as worsening dysphagia.    Clinical Impression  Pt admitted with above diagnosis. Pt currently with functional limitations due to the deficits listed below (see PT Problem List). PTA, pt living at home alone independent driving, family reports 1 known fall, pt does not utilize AD. Upon eval pt strength overall good, moving well however with balance deficits requiring RW and supervision for safety. Discussed energy conservation and avoiding over fatigue with exercise. Family unavailable for 24/7 supervision assistance at home.  Pt will benefit from skilled PT to increase their independence and safety with mobility to allow discharge to the venue listed below.       Follow Up Recommendations SNF;Supervision for mobility/OOB    Equipment Recommendations  Rolling walker with 5" wheels    Recommendations for Other Services OT consult     Precautions / Restrictions Precautions Precautions: Fall Restrictions Weight Bearing Restrictions: No      Mobility  Bed Mobility Overal bed mobility: Modified Independent                Transfers Overall transfer level: Needs assistance   Transfers: Sit to/from Stand Sit to Stand: Min guard            Ambulation/Gait Ambulation/Gait assistance: Min guard;Supervision Gait Distance (Feet): 180 Feet Assistive device: Rolling walker (2 wheeled) Gait Pattern/deviations: Step-to pattern;Step-through pattern Gait velocity: decreased   General Gait Details: pt ambulating with unsteadiness, trialed RW with improved safety, no overt LOB, VSS during  visit on RA. poor obstical avoidance due to vision issues.   Stairs            Wheelchair Mobility    Modified Rankin (Stroke Patients Only)       Balance Overall balance assessment: Needs assistance   Sitting balance-Leahy Scale: Good       Standing balance-Leahy Scale: Fair                               Pertinent Vitals/Pain Pain Assessment: No/denies pain    Home Living Family/patient expects to be discharged to:: Private residence Living Arrangements: Alone Available Help at Discharge: Family;Available PRN/intermittently Type of Home: House Home Access: Ramped entrance     Home Layout: One level Home Equipment: None      Prior Function Level of Independence: Independent         Comments: x1 fall known, no AD, driving, independent with all ADLs      Hand Dominance        Extremity/Trunk Assessment   Upper Extremity Assessment Upper Extremity Assessment: Overall WFL for tasks assessed    Lower Extremity Assessment Lower Extremity Assessment: Overall WFL for tasks assessed       Communication   Communication: HOH  Cognition Arousal/Alertness: Awake/alert Behavior During Therapy: WFL for tasks assessed/performed Overall Cognitive Status: Within Functional Limits for tasks assessed                                        General Comments General comments (  skin integrity, edema, etc.): Extensive discussion with pt and family over level of assistance and long term ideas    Exercises     Assessment/Plan    PT Assessment Patient needs continued PT services  PT Problem List Decreased mobility;Decreased activity tolerance;Decreased balance       PT Treatment Interventions DME instruction;Gait training;Stair training;Functional mobility training;Therapeutic activities;Therapeutic exercise;Balance training    PT Goals (Current goals can be found in the Care Plan section)  Acute Rehab PT Goals Patient Stated  Goal: non stated PT Goal Formulation: With patient    Frequency Min 3X/week   Barriers to discharge Decreased caregiver support lives alone    Co-evaluation               AM-PAC PT "6 Clicks" Daily Activity  Outcome Measure Difficulty turning over in bed (including adjusting bedclothes, sheets and blankets)?: None Difficulty moving from lying on back to sitting on the side of the bed? : None Difficulty sitting down on and standing up from a chair with arms (e.g., wheelchair, bedside commode, etc,.)?: None Help needed moving to and from a bed to chair (including a wheelchair)?: A Little Help needed walking in hospital room?: A Little Help needed climbing 3-5 steps with a railing? : A Little 6 Click Score: 21    End of Session Equipment Utilized During Treatment: Gait belt Activity Tolerance: Patient tolerated treatment well Patient left: in bed;with call bell/phone within reach;with family/visitor present Nurse Communication: Mobility status PT Visit Diagnosis: Unsteadiness on feet (R26.81);Muscle weakness (generalized) (M62.81)    Time: 0349-1791 PT Time Calculation (min) (ACUTE ONLY): 45 min   Charges:   PT Evaluation $PT Eval Moderate Complexity: 1 Mod PT Treatments $Gait Training: 8-22 mins $Therapeutic Activity: 8-22 mins   PT G Codes:        Reinaldo Berber, PT, DPT Acute Rehab Services Pager: [phone]    Reinaldo Berber 05/04/2018, 3:23 PM

## 2018-05-04 NOTE — Progress Notes (Signed)
PROGRESS NOTE  Jason Riley NWG:[id number] DOB: July 22, 1929 DOA: 05/02/2018 PCP: [name], MD  HPI/Recap of past 24 hours:  Continue to complain seeing double Reports slightly trouble with grits this am, but no cough or choking   Denies Short of breath, denies pain,  No fever    Assessment/Plan: Active Problems:   Aortic stenosis with mitral and aortic insufficiency   CKD (chronic kidney disease), stage III (HCC)   Coronary artery disease involving native heart   Hypertension   Hypothyroid   Myasthenia gravis ([place])   AKI (acute kidney injury) ([place])   Myasthenia gravis exacerbation -presented with increasing weakness, fatigue, seeing double and trouble swallowing. No sob. --Swallow eval recommended dysphagia 3 diet/thin liquid -Respiratory therapist to monitor NIF --Right IJ non-tunneled triple lumen HD catheter placed today for plasmapheresis per neurology recommendation -Continue pyridostigmine per neurology recommendation --Per [facility] urology notes , patient is recommended to start on CellCept , however he has not picked up this medication yet  -We will leave it to neurology to decide on CellCept , neurology input appreciated   AKI on CKD 3 -bun 50/cr 3.57 on presentation, baseline cr around 1.8-1.9 -From dehydration, UA unremarkable /renal ultrasound no hydronephrosis  --Home meds lisinopril HCTZ held  -renal dosing meds -Continue hydration for another 24hrs, bun 31/cr 2.16 today  HTN: BP stable without home blood pressure medication  aortic stenosis -Denies chest pain, syncope episode -per Chart review he declined aortic valve repair -Troponin on presentation mild and flat at 0.03 and 0.04.  EKG with lateral lead ST depression -Echocardiogram with normal wall motion, normal left ventricular EF.  He does has mild LVH, grade 1 diastolic dysfunction.  Severe aortic valve stenosis -Prevent dehydration as patient is preload dependent  Hypothyroidism -TSH  2.1, continue home meds  Vital signs has been stable, tele unremarkable, will change from stepdown status to med surg status. Start PT  Code Status: DNR  Family Communication: patient   Disposition Plan: will need PT eval, will need neurology clearance, may need snf,    Consultants:  Neurology  IR  Procedures: Right IJ non-tunneled triple lumen HD catheter by IR  Antibiotics:  none   Objective: BP (!) 141/51 (BP Location: Left Arm)   Pulse (!) 52   Temp 98.1 F (36.7 C) (Oral)   Resp (!) 25   Ht 5\' 9"  (1.753 m)   Wt 65.7 kg (144 lb 13.5 oz)   SpO2 97%   BMI 21.39 kg/m   Intake/Output Summary (Last 24 hours) at 05/04/2018 0807 Last data filed at 05/04/2018 0531 Gross per 24 hour  Intake 641.11 ml  Output 1425 ml  Net -783.89 ml   Filed Weights   05/02/18 1555 05/02/18 2130 05/04/18 0531  Weight: 63.5 kg (140 lb) 64.8 kg (142 lb 13.7 oz) 65.7 kg (144 lb 13.5 oz)    Exam: Patient is examined daily including today on 05/04/2018, exams remain the same as of yesterday except that has changed    General:  Thin, Frail, chronically ill, hard of hearing but NAD  Cardiovascular: RRR, + systolic murmur  Respiratory: CTABL  Abdomen: Soft/ND/NT, positive BS  Musculoskeletal: No Edema  Neuro: alert, oriented   Data Reviewed: Basic Metabolic Panel: Recent Labs  Lab 05/02/18 1559 05/03/18 0444 05/04/18 0451  NA 140 138 141  K 4.1 3.7 4.1  CL 103 105 115*  CO2 25 23 22   GLUCOSE 110* 105* 119*  BUN 53* 50* 31*  CREATININE 3.57* 3.17*  2.16*  CALCIUM 9.8 8.9 8.5*   Liver Function Tests: Recent Labs  Lab 05/04/18 0451  AST 21  ALT 16  ALKPHOS 19*  BILITOT 0.7  PROT 4.8*  ALBUMIN 3.6   No results for input(s): LIPASE, AMYLASE in the last 168 hours. No results for input(s): AMMONIA in the last 168 hours. CBC: Recent Labs  Lab 05/02/18 1559  WBC 5.8  HGB 14.2  HCT 43.6  MCV 94.4  PLT 171   Cardiac Enzymes:   Recent Labs  Lab  05/02/18 1559 05/02/18 1857 05/03/18 0444  TROPONINI 0.03* 0.04* 0.04*   BNP (last 3 results) Recent Labs    05/03/18 0444  BNP 156.3*    ProBNP (last 3 results) No results for input(s): PROBNP in the last 8760 hours.  CBG: No results for input(s): GLUCAP in the last 168 hours.  Recent Results (from the past 240 hour(s))  MRSA PCR Screening     Status: None   Collection Time: 05/02/18 11:17 PM  Result Value Ref Range Status   MRSA by PCR NEGATIVE NEGATIVE Final    Comment:        The GeneXpert MRSA Assay (FDA approved for NASAL specimens only), is one component of a comprehensive MRSA colonization surveillance program. It is not intended to diagnose MRSA infection nor to guide or monitor treatment for MRSA infections. Performed at [facility], [place] [address] [place], [place] [address]      Studies: US Renal  Result Date: 05/03/2018 CLINICAL DATA:  Renal insufficiency. EXAM: RENAL / URINARY TRACT ULTRASOUND COMPLETE COMPARISON:  None. FINDINGS: Right Kidney: Length: 10.7 cm. The right renal cortex demonstrates increased cortical echogenicity and atrophy. No hydronephrosis. Simple cyst of the upper pole measures 1.9 cm in greatest diameter. Simple cyst of the mid cortex measures 1.5 cm in greatest diameter. Left Kidney: Length: 10.3 cm. The left renal cortex demonstrates atrophy and increased echogenicity. No hydronephrosis. Simple cyst of the upper pole measures 1.5 cm in greatest diameter. Bladder: Appears normal for degree of bladder distention. IMPRESSION: Equal sized kidneys with both demonstrating cortical atrophy and increased cortical echogenicity. No evidence of hydronephrosis. Both kidneys demonstrate simple cysts. Electronically Signed   By: Aletta Edouard M.D.   On: 05/03/2018 10:04   Ir Fluoro Guide Cv Line Left  Result Date: 05/03/2018 INDICATION: 84 year old male with myasthenia gravis in need of central venous access for plasmapheresis. EXAM: IR  ULTRASOUND GUIDANCE VASC ACCESS RIGHT; IR LEFT FLOURO GUIDE CV LINE MEDICATIONS: None ANESTHESIA/SEDATION: None FLUOROSCOPY TIME:  Fluoroscopy Time: 0 minutes 9 seconds (9 mGy). COMPLICATIONS: None immediate. PROCEDURE: Informed written consent was obtained from the patient after a thorough discussion of the procedural risks, benefits and alternatives. All questions were addressed. Maximal Sterile Barrier Technique was utilized including caps, mask, sterile gowns, sterile gloves, sterile drape, hand hygiene and skin antiseptic. A timeout was performed prior to the initiation of the procedure. The right internal jugular vein was interrogated with ultrasound and found to be widely patent. An image was obtained and stored for the medical record. Local anesthesia was attained by infiltration with 1% lidocaine. A small dermatotomy was made. Under real-time sonographic guidance, the vessel was punctured with a 21 gauge micropuncture needle. Using standard technique, the initial micro needle was exchanged over a 0.018 micro wire for a transitional 4 [place] micro sheath. The micro sheath was then exchanged over a 0.035 wire for a soft tissue dilator and the skin tract was dilated. A 20 cm triple-lumen  non tunneled hemodialysis catheter was then advanced over the wire and positioned with the catheter tips in the mid right atrium. A fluoroscopic saved image was obtained and saved for the medical record. The catheter flushes and aspirates easily. The catheter was flushed with heparinized saline and secured to the skin with 0 Prolene suture. A sterile bandage was applied. IMPRESSION: Right IJ non tunneled triple-lumen hemodialysis catheter. Catheter tips are in the mid right atrium and ready for immediate use. Signed, Criselda Peaches, MD [facility] Radiology Electronically Signed   By: Jacqulynn Cadet M.D.   On: 05/03/2018 15:13   Ir US Guide Vasc Access Right  Result  Date: 05/03/2018 INDICATION: 84 year old male with myasthenia gravis in need of central venous access for plasmapheresis. EXAM: IR ULTRASOUND GUIDANCE VASC ACCESS RIGHT; IR LEFT FLOURO GUIDE CV LINE MEDICATIONS: None ANESTHESIA/SEDATION: None FLUOROSCOPY TIME:  Fluoroscopy Time: 0 minutes 9 seconds (9 mGy). COMPLICATIONS: None immediate. PROCEDURE: Informed written consent was obtained from the patient after a thorough discussion of the procedural risks, benefits and alternatives. All questions were addressed. Maximal Sterile Barrier Technique was utilized including caps, mask, sterile gowns, sterile gloves, sterile drape, hand hygiene and skin antiseptic. A timeout was performed prior to the initiation of the procedure. The right internal jugular vein was interrogated with ultrasound and found to be widely patent. An image was obtained and stored for the medical record. Local anesthesia was attained by infiltration with 1% lidocaine. A small dermatotomy was made. Under real-time sonographic guidance, the vessel was punctured with a 21 gauge micropuncture needle. Using standard technique, the initial micro needle was exchanged over a 0.018 micro wire for a transitional 4 [place] micro sheath. The micro sheath was then exchanged over a 0.035 wire for a soft tissue dilator and the skin tract was dilated. A 20 cm triple-lumen non tunneled hemodialysis catheter was then advanced over the wire and positioned with the catheter tips in the mid right atrium. A fluoroscopic saved image was obtained and saved for the medical record. The catheter flushes and aspirates easily. The catheter was flushed with heparinized saline and secured to the skin with 0 Prolene suture. A sterile bandage was applied. IMPRESSION: Right IJ non tunneled triple-lumen hemodialysis catheter. Catheter tips are in the mid right atrium and ready for immediate use. Signed, Criselda Peaches, MD [facility]  [facility] Radiology Electronically Signed   By: Jacqulynn Cadet M.D.   On: 05/03/2018 15:13    Scheduled Meds: . heparin  1,000 Units Intracatheter Once  . levothyroxine  50 mcg Oral Daily  . pyridostigmine  60 mg Oral QID    Continuous Infusions: . citrate dextrose    . citrate dextrose 500 mL (05/03/18 1620)  . dextrose 5 % and 0.45% NaCl 100 mL/hr at 05/04/18 0405  . sodium chloride       Time spent: 15mins I have personally reviewed and interpreted on  05/04/2018 daily labs, tele strips, imagings as discussed above under date review session and assessment and plans.  I reviewed all nursing notes, pharmacy notes, consultant notes,  vitals, pertinent old records  I have discussed plan of care as described above with RN , patient  on 05/04/2018   Florencia Reasons MD, PhD  [facility] Pager [phone]. If 7PM-7AM, please contact night-coverage at [email or web address], password [email or web address] 05/04/2018, 8:07 AM  LOS: 2 days

## 2018-05-04 NOTE — Progress Notes (Signed)
NIF -50 and VC of 1.4

## 2018-05-04 NOTE — Progress Notes (Addendum)
Subjective: Patient is awake alert oriented x3 in bed, in no distress, denies chest pain or SOB, only with exertion. Denies fatigue at this time, continues to endorse double vision, unchanged from yesterday  Exam: Vitals:   05/04/18 0812 05/04/18 0920  BP: (!) 136/48 (!) 136/48  Pulse: (!) 50 (!) 59  Resp: 19 (!) 22  Temp: 98 F (36.7 C)   SpO2: 96% 97%    Physical Exam  HEENT-  Normocephalic, no lesions, without obvious abnormality.  Normal external eye and conjunctiva.   Cardiovascular- S1-S2 audible, pulses palpable throughout   Lungs-no rhonchi or wheezing noted, normal respiration effort, single breath count 20 Saturations within normal limits Abdomen- All 4 quadrants palpated and nontender Musculoskeletal-no joint tenderness, deformity or swelling Skin-warm and dry,  Neuro:  Mental Status: Alert, oriented, thought content appropriate.  Speech fluent without evidence of aphasia.  Able to follow 3 step commands without difficulty. Cranial Nerves: II:  Visual fields grossly normal, except for pt with double vision III,IV, VI: ptosis not present, saccadic eye movement bilaterally, more pronounced on side ot side ocular movement. extra-ocular motions intact bilaterally pupils equal, round, reactive to light and accommodation V,VII: smile symmetric, facial light touch sensation normal bilaterally VIII: hearing intact to voice IX,X: uvula rises symmetrically XI: bilateral shoulder shrug XII: midline tongue extension Motor: Right : Upper extremity   5/5    Left:     Upper extremity   5/5  Lower extremity   5/5     Lower extremity   5/5 Tone and bulk:normal tone throughout; no atrophy noted Sensory: intact  bilaterally Deep Tendon Reflexes: 2+ and symmetric throughout Plantars: Right: downgoing   Left: downgoing Gait: normal gait and station    Medications:  . heparin  1,000 Units Intracatheter Once  . levothyroxine  50 mcg Oral Daily  . pyridostigmine  60 mg Oral QID     Pertinent Labs/Diagnostics:   Dg Chest 2 View  05/02/2018 IMPRESSION: Calcified granuloma right mid lung.  No edema or consolidation. There is aortic atherosclerosis. Heart size normal. Calcification in the aortic valve raises concern for potential degree of aortic stenosis.   Assessment:  84 y.o. male with past medical history of seropositive myasthenia gravis diagnosed in January 2019, prostate cancer presents to [facility] emergency room for worsening symptoms of weakness, double vision and shortness of breath on exertion as well as worsening dysphagia.  1. Myasthenia Gravis Exacerbation Patient to continue on Mestinon, he did not begin the CellCept ordered by his neurologist at [facility] -He does not appear to be in crisis at this time, with normal respirations except for with exertion, normal speech, moves extremities equally at this time, but does admit to fatigue with repetitive movements.  IR for tunneled Cath inserted  7/13 for plasma exchange which began on 7/13 to continue for 5 doses 2.  Severe aortic stenosis- 3.  Shortness of breath-patient denies dyspnea at this a.m., chest x-ray did not show any acute processes except for calcified granuloma and Aortic Stenosis 4. Dysphagia 5. AKI on CKD stage 3/4 - improving 6. Hypothyroid  Recommendations: - Continue plasma exchange x5 treatments beginning on 7/13 - Speech therapy eval -Continue Mestignon -Monitor for improvement post plasma exchange - Avoid Nephrotoxic drugs -Continue Dysphagia diet 3  Letha Cape DNP Neuro-hospitalist Team [phone] 05/04/2018, 12:45 PM  I have seen the patient. He has diploplia on rightward gaze. I will schedule PLEX for Monday, Tuesday, Thursday Saturday.   Roland Rack, MD [facility] [phone]  If 7pm- 7am, please page neurology on call as listed in [place].

## 2018-05-05 LAB — BASIC METABOLIC PANEL
Anion gap: 5 (ref 5–15)
BUN: 23 mg/dL (ref 8–23)
CALCIUM: 8.6 mg/dL — AB (ref 8.9–10.3)
CO2: 20 mmol/L — ABNORMAL LOW (ref 22–32)
CREATININE: 2.03 mg/dL — AB (ref 0.61–1.24)
Chloride: 116 mmol/L — ABNORMAL HIGH (ref 98–111)
GFR calc Af Amer: 32 mL/min — ABNORMAL LOW (ref >60)
GFR, EST NON AFRICAN AMERICAN: 28 mL/min — AB (ref >60)
GLUCOSE: 115 mg/dL — AB (ref 70–99)
Potassium: 3.9 mmol/L (ref 3.5–5.1)
Sodium: 141 mmol/L (ref 135–145)

## 2018-05-05 LAB — CBC
HCT: 37.1 % — ABNORMAL LOW (ref 39.0–52.0)
HEMOGLOBIN: 12.4 g/dL — AB (ref 13.0–17.0)
MCH: 31.6 pg (ref 26.0–34.0)
MCHC: 33.4 g/dL (ref 30.0–36.0)
MCV: 94.6 fL (ref 78.0–100.0)
PLATELETS: 123 10*3/uL — AB (ref 150–400)
RBC: 3.92 MIL/uL — AB (ref 4.22–5.81)
RDW: 12.7 % (ref 11.5–15.5)
WBC: 7.5 10*3/uL (ref 4.0–10.5)

## 2018-05-05 LAB — POCT I-STAT, CHEM 8
BUN: 38 mg/dL — ABNORMAL HIGH (ref 8–23)
Calcium, Ion: 1.27 mmol/L (ref 1.15–1.40)
Chloride: 106 mmol/L (ref 98–111)
Creatinine, Ser: 2.4 mg/dL — ABNORMAL HIGH (ref 0.61–1.24)
Glucose, Bld: 86 mg/dL (ref 70–99)
HEMATOCRIT: 35 % — AB (ref 39.0–52.0)
HEMOGLOBIN: 11.9 g/dL — AB (ref 13.0–17.0)
Potassium: 4.1 mmol/L (ref 3.5–5.1)
SODIUM: 140 mmol/L (ref 135–145)
TCO2: 23 mmol/L (ref 22–32)

## 2018-05-05 MED ORDER — DIPHENHYDRAMINE HCL 25 MG PO CAPS: 25.0000 mg | ORAL_CAPSULE | Freq: Four times a day (QID) | ORAL | Status: DC | PRN

## 2018-05-05 MED ORDER — HEPARIN SODIUM (PORCINE) 1000 UNIT/ML IJ SOLN
1000.0000 [IU] | Freq: Once | INTRAMUSCULAR | Status: DC
  Filled 2018-05-05: qty 1

## 2018-05-05 MED ORDER — ACETAMINOPHEN 325 MG PO TABS: 650.0000 mg | ORAL_TABLET | ORAL | Status: DC | PRN

## 2018-05-05 MED ORDER — SODIUM CHLORIDE 0.9 % IV SOLN
2.0000 g | Freq: Once | INTRAVENOUS | Status: AC
  Administered 2018-05-05: 2 g via INTRAVENOUS
  Filled 2018-05-05: qty 20

## 2018-05-05 MED ORDER — ACD FORMULA A 0.73-2.45-2.2 GM/100ML VI SOLN
500.0000 mL | Status: DC
  Filled 2018-05-05: qty 500

## 2018-05-05 MED ORDER — SODIUM CHLORIDE 0.9 % IV SOLN
INTRAVENOUS | Status: AC
  Administered 2018-05-05 (×3): via INTRAVENOUS_CENTRAL
  Filled 2018-05-05 (×3): qty 200

## 2018-05-05 MED ORDER — CALCIUM CARBONATE ANTACID 500 MG PO CHEW
CHEWABLE_TABLET | ORAL | Status: AC
  Administered 2018-05-05: 400 mg via ORAL
  Filled 2018-05-05: qty 2

## 2018-05-05 MED ORDER — ISOSORBIDE MONONITRATE ER 30 MG PO TB24: 30.0000 mg | ORAL_TABLET | Freq: Every day | ORAL | Status: DC

## 2018-05-05 MED ORDER — ACD FORMULA A 0.73-2.45-2.2 GM/100ML VI SOLN
Status: AC
  Administered 2018-05-05: 500 mL via INTRAVENOUS
  Filled 2018-05-05: qty 500

## 2018-05-05 MED ORDER — CALCIUM CARBONATE ANTACID 500 MG PO CHEW
2.0000 | CHEWABLE_TABLET | ORAL | Status: DC
  Administered 2018-05-05: 400 mg via ORAL

## 2018-05-05 MED ORDER — AMLODIPINE BESYLATE 5 MG PO TABS
5.0000 mg | ORAL_TABLET | Freq: Every day | ORAL | Status: DC
  Administered 2018-05-05 – 2018-05-11 (×7): 5 mg via ORAL
  Filled 2018-05-05 (×7): qty 1

## 2018-05-05 NOTE — Clinical Social Work Note (Signed)
Clinical Social Work Assessment  Patient Details  Name: Jason Riley MRN: [id number] Date of Birth: 08/17/1929  Date of referral:  05/05/18               Reason for consult:  Facility Placement                Permission sought to share information with:  Facility Sport and exercise psychologist, Family Supports Permission granted to share information::  Yes, Verbal Permission Granted  Name::     Patent attorney::  SNFs  Relationship::  Niece  Contact Information:  [phone]  Housing/Transportation Living arrangements for the past 2 months:  [place] of Information:  Patient Patient Interpreter Needed:  None Criminal Activity/Legal Involvement Pertinent to Current Situation/Hospitalization:  No - Comment as needed Significant Relationships:  Other Family Members Lives with:  Self Do you feel safe going back to the place where you live?  No Need for family participation in patient care:  Yes (Comment)  Care giving concerns:  CSW received consult for possible SNF placement at time of discharge. CSW spoke with patient regarding PT recommendation of SNF placement at time of discharge. Patient reported that since his spouse passed away he likely needs rehab before going back home alone. Patient expressed understanding of PT recommendation and is agreeable to SNF placement at time of discharge. CSW to continue to follow and assist with discharge planning needs.   Social Worker assessment / plan:  CSW spoke with patient concerning possibility of rehab at [facility] before returning home.  Employment status:  Retired Nurse, adult PT Recommendations:  [place] / Referral to community resources:  [place]  Patient/Family's Response to care:  Patient recognizes need for rehab before returning home and is agreeable to a SNF but would like CSW to contact his niece to see which [facility] she would like. Patient's  wife passed away at [facility].   Patient/Family's Understanding of and Emotional Response to Diagnosis, Current Treatment, and Prognosis:  Patient/family is realistic regarding therapy needs and expressed being hopeful for SNF placement. Patient expressed understanding of CSW role and discharge process as well as medical condition. No questions/concerns about plan or treatment.    Emotional Assessment Appearance:  Appears stated age Attitude/Demeanor/Rapport:  Gracious Affect (typically observed):  Accepting, Appropriate Orientation:  Oriented to Self, Oriented to Place, Oriented to  Time, Oriented to Situation Alcohol / Substance use:  Not Applicable Psych involvement (Current and /or in the community):  No (Comment)  Discharge Needs  Concerns to be addressed:  Care Coordination Readmission within the last 30 days:  No Current discharge risk:  None Barriers to Discharge:  Continued Medical Work up   [employer], [place] 05/05/2018, 1:01 PM

## 2018-05-05 NOTE — NC FL2 (Signed)
[place] LEVEL OF CARE SCREENING TOOL     IDENTIFICATION  Patient Name: Jason Riley Birthdate: 30-Dec-1928 Sex: male Admission Date (Current Location): 05/02/2018  [facility] and [place] Number:  Engineering geologist and Address:  [facility] [place]. [facility], [place] [address], [place], [place] [address]      Provider Number: [id number]  Attending Physician Name and Address:  Florencia Reasons, MD  Relative Name and Phone Number:  Fredricka Bonine [phone]    Current Level of Care: Hospital Recommended Level of Care: [place] Prior Approval Number:    Date Approved/Denied:   PASRR Number: [id number]  Discharge Plan: SNF    Current Diagnoses: Patient Active Problem List   Diagnosis Date Noted  . Myasthenia gravis ([place]) 05/02/2018  . AKI (acute kidney injury) ([place]) 05/02/2018  . Back pain 11/03/2015  . Carotid bruit 11/03/2015  . History of measles 11/03/2015  . CKD (chronic kidney disease), stage III ([place]) 11/03/2015  . Coronary artery disease involving native heart 11/03/2015  . DOE (dyspnea on exertion) 11/03/2015  . Hyperlipidemia 11/03/2015  . Hypothyroid 11/03/2015  . Kidney stone 11/03/2015  . History of prostate cancer 11/03/2015  . Right inguinal pain 11/03/2015  . Aortic stenosis with mitral and aortic insufficiency 08/15/2012  . Hypertension 08/15/2012    Orientation RESPIRATION BLADDER Height & Weight     Self, Time, Situation, Place  Normal Continent Weight: 66 kg (145 lb 8.1 oz) Height:  5\' 9"  (175.3 cm)  BEHAVIORAL SYMPTOMS/MOOD NEUROLOGICAL BOWEL NUTRITION STATUS      Continent Diet(Please see DC Summary)  AMBULATORY STATUS COMMUNICATION OF NEEDS Skin   Supervision Verbally Normal                       Personal Care Assistance Level of Assistance  Bathing, Feeding, Dressing Bathing Assistance: Limited assistance Feeding assistance: Independent Dressing Assistance: Limited assistance     Functional Limitations Info   Sight, Hearing, Speech Sight Info: Impaired Hearing Info: Impaired Speech Info: Adequate    SPECIAL CARE FACTORS FREQUENCY  PT (By licensed PT), OT (By licensed OT)     PT Frequency: 5x/week OT Frequency: 3x/week            Contractures      Additional Factors Info  Code Status, Allergies Code Status Info: DNR Allergies Info: Atorvastatin, Sulfa Antibiotics           Current Medications (05/05/2018):  This is the current hospital active medication list Current Facility-Administered Medications  Medication Dose Route Frequency Provider Last Rate Last Dose  . acetaminophen (TYLENOL) tablet 650 mg  650 mg Oral Q4H PRN Aroor, Lanice Schwab, MD      . acetaminophen (TYLENOL) tablet 650 mg  650 mg Oral Q4H PRN Aroor, Karena Addison R, MD      . citrate dextrose (ACD-A anticoagulant) solution 500 mL  500 mL Intravenous Continuous Aroor, Karena Addison R, MD      . citrate dextrose (ACD-A anticoagulant) solution 500 mL  500 mL Intravenous Continuous Aroor, Karena Addison R, MD 12 mL/hr at 05/03/18 1620 500 mL at 05/03/18 1950  . diphenhydrAMINE (BENADRYL) capsule 25 mg  25 mg Oral Q6H PRN Aroor, Karena Addison R, MD      . diphenhydrAMINE (BENADRYL) capsule 25 mg  25 mg Oral Q6H PRN Aroor, Lanice Schwab, MD      . heparin injection 1,000 Units  1,000 Units Intracatheter Once Aroor, Lanice Schwab, MD      . levothyroxine (  SYNTHROID, LEVOTHROID) tablet 50 mcg  50 mcg Oral Daily Gwynne Edinger, MD   50 mcg at 05/05/18 0912  . lidocaine (XYLOCAINE) 1 % (with pres) injection   Infiltration PRN Jacqulynn Cadet, MD   10 mL at 05/03/18 1431  . pyridostigmine (MESTINON) tablet 60 mg  60 mg Oral QID Gwynne Edinger, MD   60 mg at 05/05/18 0916  . sodium chloride 0.9 % bolus 1,000 mL  1,000 mL Intravenous Once Florencia Reasons, MD   Stopped at 05/03/18 1849     Discharge Medications: Please see discharge summary for a list of discharge medications.  Relevant Imaging Results:  Relevant Lab Results:   Additional  Information SSN: [place], [place]

## 2018-05-05 NOTE — Progress Notes (Signed)
NIF greater than -40  VC 2.7L

## 2018-05-05 NOTE — Progress Notes (Signed)
  Speech Language Pathology Treatment: Dysphagia  Patient Details Name: Jason Riley MRN: [id number] DOB: [birth year] Today's Date: 05/05/2018 Time: 0805-0820 SLP Time Calculation (min) (ACUTE ONLY): 15 min  Assessment / Plan / Recommendation Clinical Impression  Patient's nurse reports pt has been tolerating meals and medication with no s/s of aspiration. Pt observed with breakfast this morning (Dys 3, thin liquids). Reviewed aspiration precautions, sitting up for all meals and following solids with liquids. Pt able to feed himself with assistance setting up (opening packages). Patient has met all swallow goals. No further speech services needed at this time.    HPI HPI: CROSLEY STEJSKAL is a 84 y.o. male with medical history significant for recently diagnosed myasthenia gravis (followed by [facility]), prostate cancer s/p prostatectomy 2001, severe AS (seen on 2015 TTE). Symptoms have been worsening for a month or two. Generalized weakness. Once fell and really struggled to get back up. Having continued double vision. Also occasional choking. Lots of unsteadiness. No chest pain or SOB. No fevers. No melena. No focal symptoms. Spoke with his neurologist who advised presenting to [facility] for admission for possible myasthenia flare but patient opted to come here as much closer to his home. No new orthopnea or LE swelling. Thinks has been eating and drinking a bit less than normal 2/2 fatigue and trouble chewing and swallowing.       SLP Plan  All goals met       Recommendations  Diet recommendations: Dysphagia 3 (mechanical soft);Thin liquid Liquids provided via: Cup Medication Administration: Whole meds with liquid Supervision: Patient able to self feed Compensations: Follow solids with liquid Postural Changes and/or Swallow Maneuvers: Upright 30-60 min after meal                Plan: All goals met       [place], MA, CCC-SLP 05/05/2018 8:42 AM

## 2018-05-05 NOTE — Progress Notes (Signed)
PROGRESS NOTE  Jason Riley MIW:[id number] DOB: Sep 26, 1929 DOA: 05/02/2018 PCP: [name], MD  HPI/Recap of past 24 hours:  Reports feeling better Reports slightly trouble with grits this am, but no cough or choking   Denies Short of breath, denies pain,  No fever    Assessment/Plan: Active Problems:   Aortic stenosis with mitral and aortic insufficiency   CKD (chronic kidney disease), stage III (HCC)   Coronary artery disease involving native heart   Hypertension   Hypothyroid   Myasthenia gravis ([place])   AKI (acute kidney injury) ([place])   Myasthenia gravis exacerbation -presented with increasing weakness, fatigue, seeing double and trouble swallowing. No sob. --Swallow eval recommended dysphagia 3 diet/thin liquid -Respiratory therapist to monitor NIF --Right IJ non-tunneled triple lumen HD catheter placed today for plasmapheresis per neurology recommendation -Continue pyridostigmine per neurology recommendation --Per [facility] urology notes , patient is recommended to start on CellCept , however he has not picked up this medication yet  -We will leave it to neurology to decide on CellCept , neurology input appreciated -NEUROLOGY will schedule PLEX for Monday, Tuesday, Thursday Saturday. d/c to SNF after plasmapheresis.      AKI on CKD 3 -bun 50/cr 3.57 on presentation, baseline cr around 1.8-1.9 -From dehydration, UA unremarkable /renal ultrasound no hydronephrosis  --Home meds lisinopril HCTZ held  -renal dosing meds -he received hydration from admission, will stop ivf today, bun 23/cr 2.03 today, now close to baseline  HTN: -Home BP med held on presentation due to borderline bp and aki -Blood pressure start to trend up, will start norvasc for bp control -  Not a good candidate for betablocker or ccb, he has tendency to have bradycardia -, plan to discontinue home meds acei due to ckd -no a good candidate for imdur due to vasodilatation effect in the setting of  aortic stenosis  aortic stenosis -Denies chest pain, syncope episode -per Chart review he declined aortic valve repair -Troponin on presentation mild and flat at 0.03 and 0.04.  EKG with lateral lead ST depression -Echocardiogram with normal wall motion, normal left ventricular EF.  He does has mild LVH, grade 1 diastolic dysfunction.  Severe aortic valve stenosis -Prevent dehydration as patient is preload dependent  Hypothyroidism -TSH 2.1, continue home meds  FTT:  PT recommended SNF at time of discharge.  Code Status: DNR  Family Communication: patient   Disposition Plan: SNF likely over the weekend after plasma pheresis ,  will need neurology clearance   Consultants:  Neurology  IR  Procedures: Right IJ non-tunneled triple lumen HD catheter by IR Plasma exchange  Antibiotics:  none   Objective: BP (!) 184/70 (BP Location: Left Arm)   Pulse 64   Temp 98.3 F (36.8 C) (Oral)   Resp 17   Ht 5\' 9"  (1.753 m)   Wt 66 kg (145 lb 8.1 oz)   SpO2 97%   BMI 21.49 kg/m   Intake/Output Summary (Last 24 hours) at 05/05/2018 0757 Last data filed at 05/05/2018 2500 Gross per 24 hour  Intake 200 ml  Output 3400 ml  Net -3200 ml   Filed Weights   05/02/18 2130 05/04/18 0531 05/05/18 0500  Weight: 64.8 kg (142 lb 13.7 oz) 65.7 kg (144 lb 13.5 oz) 66 kg (145 lb 8.1 oz)    Exam: Patient is examined daily including today on 05/05/2018, exams remain the same as of yesterday except that has changed    General:  Thin, Frail, chronically ill, hard  of hearing but NAD  Cardiovascular: RRR, + systolic murmur  Respiratory: CTABL  Abdomen: Soft/ND/NT, positive BS  Musculoskeletal: No Edema  Neuro: alert, oriented   Data Reviewed: Basic Metabolic Panel: Recent Labs  Lab 05/02/18 1559 05/03/18 0444 05/04/18 0451 05/05/18 0409  NA 140 138 141 141  K 4.1 3.7 4.1 3.9  CL 103 105 115* 116*  CO2 25 23 22  20*  GLUCOSE 110* 105* 119* 115*  BUN 53* 50* 31* 23    CREATININE 3.57* 3.17* 2.16* 2.03*  CALCIUM 9.8 8.9 8.5* 8.6*   Liver Function Tests: Recent Labs  Lab 05/04/18 0451  AST 21  ALT 16  ALKPHOS 19*  BILITOT 0.7  PROT 4.8*  ALBUMIN 3.6   No results for input(s): LIPASE, AMYLASE in the last 168 hours. No results for input(s): AMMONIA in the last 168 hours. CBC: Recent Labs  Lab 05/02/18 1559  WBC 5.8  HGB 14.2  HCT 43.6  MCV 94.4  PLT 171   Cardiac Enzymes:   Recent Labs  Lab 05/02/18 1559 05/02/18 1857 05/03/18 0444  TROPONINI 0.03* 0.04* 0.04*   BNP (last 3 results) Recent Labs    05/03/18 0444  BNP 156.3*    ProBNP (last 3 results) No results for input(s): PROBNP in the last 8760 hours.  CBG: No results for input(s): GLUCAP in the last 168 hours.  Recent Results (from the past 240 hour(s))  MRSA PCR Screening     Status: None   Collection Time: 05/02/18 11:17 PM  Result Value Ref Range Status   MRSA by PCR NEGATIVE NEGATIVE Final    Comment:        The GeneXpert MRSA Assay (FDA approved for NASAL specimens only), is one component of a comprehensive MRSA colonization surveillance program. It is not intended to diagnose MRSA infection nor to guide or monitor treatment for MRSA infections. Performed at [facility], [place] [address], [place], [place] [address]      Studies: No results found.  Scheduled Meds: . heparin  1,000 Units Intracatheter Once  . levothyroxine  50 mcg Oral Daily  . pyridostigmine  60 mg Oral QID    Continuous Infusions: . citrate dextrose    . citrate dextrose 500 mL (05/03/18 1620)  . sodium chloride Stopped (05/03/18 1849)     Time spent: 68mins, case discussed with neurology Dr Rory Percy over the phone. I have personally reviewed and interpreted on  05/05/2018 daily labs,  imagings as discussed above under date review session and assessment and plans.  I reviewed all nursing notes, pharmacy notes, consultant notes,  vitals, pertinent old records  I have  discussed plan of care as described above with RN , patient  on 05/05/2018   Florencia Reasons MD, PhD  [facility] Pager [phone]. If 7PM-7AM, please contact night-coverage at [email or web address], password [facility] 05/05/2018, 7:57 AM  LOS: 3 days

## 2018-05-05 NOTE — Care Management Important Message (Signed)
Important Message  Patient Details  Name: Jason Riley MRN: [id number] Date of Birth: January 09, 1929   Medicare Important Message Given:  Yes    [name] 05/05/2018, 3:51 PM

## 2018-05-05 NOTE — Evaluation (Signed)
Occupational Therapy Evaluation Patient Details Name: Jason Riley MRN: [id number] DOB: 12/27/1928 Today's Date: 05/05/2018    History of Present Illness 84 y.o. male with past medical history of seropositive myasthenia gravis diagnosed in January 2019, prostate cancer presents to [facility] emergency room for worsening symptoms of weakness, double vision and shortness of breath on exertion as well as worsening dysphagia.   Clinical Impression   PTA, pt was independent with ADL and functional mobility. He currently requires overall min guard assist for functional mobility during ADL transfers with RW. Pt moves slowly and closes one eye when navigating obstacles due to double vision. He reports that while he does have some double vision at baseline it has become worse. Pt uses eye-patch during reading tasks. Feel that he would benefit from partial occlusion taping and sent text-page to neurology NP to pursue this. Pt requiring supervision to min guard assist for ADL participation. He does not have assistance available at home and would benefit from short-term SNF to maximize independence and safety prior to returning home.    Follow Up Recommendations  SNF;Supervision/Assistance - 24 hour    Equipment Recommendations  Tub/shower seat    Recommendations for Other Services       Precautions / Restrictions Precautions Precautions: Fall Restrictions Weight Bearing Restrictions: No      Mobility Bed Mobility Overal bed mobility: Modified Independent                Transfers Overall transfer level: Needs assistance Equipment used: Rolling walker (2 wheeled) Transfers: Sit to/from Stand Sit to Stand: Min guard         General transfer comment: Pt slightly unstable and requires cues for safe hand placement.     Balance Overall balance assessment: Needs assistance Sitting-balance support: No upper extremity supported;Feet supported Sitting balance-Leahy Scale: Good      Standing balance support: Bilateral upper extremity supported;No upper extremity supported;During functional activity Standing balance-Leahy Scale: Fair                             ADL either performed or assessed with clinical judgement   ADL Overall ADL's : Needs assistance/impaired Eating/Feeding: Set up;Sitting   Grooming: Supervision/safety;Standing Grooming Details (indicate cue type and reason): Noted difficulty due to poor depth perception.  Upper Body Bathing: Sitting;Supervision/ safety   Lower Body Bathing: Sit to/from stand;Supervison/ safety   Upper Body Dressing : Supervision/safety;Sitting   Lower Body Dressing: Supervision/safety;Sit to/from stand   Toilet Transfer: Ambulation;RW;Min Psychiatric nurse Details (indicate cue type and reason): Pt requiring general supervision for safety.  Toileting- Clothing Manipulation and Hygiene: Supervision/safety;Sit to/from stand       Functional mobility during ADLs: Min guard General ADL Comments: Pt demonstrating greatest difficulty due to double vision and poor depth perception.      Vision Patient Visual Report: Blurring of vision;Diplopia(L eye blurry; reports diplopia at baseline but worse now) Vision Assessment?: Yes Eye Alignment: Within Functional Limits Ocular Range of Motion: Other (comment) Alignment/Gaze Preference: Within Defined Limits Tracking/Visual Pursuits: Able to track stimulus in all quads without difficulty;Other (comment)(although note dysconjugate movement) Saccades: Other (comment)(L eye with additional movements) Convergence: Within functional limits Visual Fields: No apparent deficits Diplopia Assessment: Disappears with one eye closed;Objects split side to side(worse with L gaze) Depth Perception: Overshoots Additional Comments: Pt with some double vision at baseline but this has become worse with current exacerbation. He has worn eye patch for reading and  tends to close one  eye for navigation. Pt reports poor depth perception. Text page sent to nurse practitioner concerning potential use of partial occlusion taping on glasses for more functional application.       Perception     Praxis      Pertinent Vitals/Pain Pain Assessment: No/denies pain     Hand Dominance     Extremity/Trunk Assessment Upper Extremity Assessment Upper Extremity Assessment: Overall WFL for tasks assessed   Lower Extremity Assessment Lower Extremity Assessment: Overall WFL for tasks assessed       Communication Communication Communication: HOH   Cognition Arousal/Alertness: Awake/alert Behavior During Therapy: WFL for tasks assessed/performed Overall Cognitive Status: Within Functional Limits for tasks assessed                                     General Comments  Pt reports willing to try partial occlusion.     Exercises     Shoulder Instructions      Home Living Family/patient expects to be discharged to:: Private residence Living Arrangements: Alone Available Help at Discharge: Family;Available PRN/intermittently Type of Home: House Home Access: Ramped entrance     Home Layout: One level     Bathroom Shower/Tub: Teacher, early years/pre: Standard     Home Equipment: None          Prior Functioning/Environment Level of Independence: Independent        Comments: pt reports history of fall but was independent requiring no assistive devices        OT Problem List: Decreased strength;Decreased activity tolerance;Impaired balance (sitting and/or standing);Impaired vision/perception;Decreased coordination;Decreased knowledge of use of DME or AE;Decreased knowledge of precautions      OT Treatment/Interventions: Self-care/ADL training;Therapeutic exercise;Energy conservation;DME and/or AE instruction;Therapeutic activities;Patient/family education;Balance training    OT Goals(Current goals can be found in the care plan  section) Acute Rehab OT Goals Patient Stated Goal: to get back to normal OT Goal Formulation: With patient Time For Goal Achievement: 05/19/18 Potential to Achieve Goals: Good  OT Frequency: Min 2X/week   Barriers to D/C:            Co-evaluation              AM-PAC PT "6 Clicks" Daily Activity     Outcome Measure Help from another person eating meals?: None Help from another person taking care of personal grooming?: A Little Help from another person toileting, which includes using toliet, bedpan, or urinal?: A Little Help from another person bathing (including washing, rinsing, drying)?: A Little Help from another person to put on and taking off regular upper body clothing?: None Help from another person to put on and taking off regular lower body clothing?: None 6 Click Score: 21   End of Session Equipment Utilized During Treatment: Rolling walker  Activity Tolerance: Patient tolerated treatment well Patient left: in bed;with call bell/phone within reach;with bed alarm set  OT Visit Diagnosis: Other abnormalities of gait and mobility (R26.89);Low vision, both eyes (H54.2)                Time: 0093-8182 OT Time Calculation (min): 20 min Charges:  OT General Charges $OT Visit: 1 Visit OT Evaluation $OT Eval Moderate Complexity: 1 Mod G-Codes:     Jason Herrlich, MS OTR/L  Pager: Jason Riley 05/05/2018, 4:28 PM

## 2018-05-06 LAB — POCT I-STAT, CHEM 8
BUN: 21 mg/dL (ref 8–23)
CALCIUM ION: 1.27 mmol/L (ref 1.15–1.40)
Chloride: 109 mmol/L (ref 98–111)
Creatinine, Ser: 1.9 mg/dL — ABNORMAL HIGH (ref 0.61–1.24)
Glucose, Bld: 118 mg/dL — ABNORMAL HIGH (ref 70–99)
HEMATOCRIT: 34 % — AB (ref 39.0–52.0)
Hemoglobin: 11.6 g/dL — ABNORMAL LOW (ref 13.0–17.0)
Potassium: 4.4 mmol/L (ref 3.5–5.1)
SODIUM: 141 mmol/L (ref 135–145)
TCO2: 20 mmol/L — AB (ref 22–32)

## 2018-05-06 LAB — BASIC METABOLIC PANEL
Anion gap: 4 — ABNORMAL LOW (ref 5–15)
BUN: 17 mg/dL (ref 8–23)
CO2: 24 mmol/L (ref 22–32)
CREATININE: 1.97 mg/dL — AB (ref 0.61–1.24)
Calcium: 8.7 mg/dL — ABNORMAL LOW (ref 8.9–10.3)
Chloride: 115 mmol/L — ABNORMAL HIGH (ref 98–111)
GFR, EST AFRICAN AMERICAN: 33 mL/min — AB (ref >60)
GFR, EST NON AFRICAN AMERICAN: 29 mL/min — AB (ref >60)
Glucose, Bld: 122 mg/dL — ABNORMAL HIGH (ref 70–99)
Potassium: 4.3 mmol/L (ref 3.5–5.1)
Sodium: 143 mmol/L (ref 135–145)

## 2018-05-06 MED ORDER — ACETAMINOPHEN 325 MG PO TABS: 650.0000 mg | ORAL_TABLET | ORAL | Status: DC | PRN

## 2018-05-06 MED ORDER — SODIUM CHLORIDE 0.9 % IV SOLN
2.0000 g | Freq: Once | INTRAVENOUS | Status: AC
  Administered 2018-05-06: 2 g via INTRAVENOUS
  Filled 2018-05-06 (×2): qty 20

## 2018-05-06 MED ORDER — LEVOTHYROXINE SODIUM 50 MCG PO TABS
50.0000 ug | ORAL_TABLET | Freq: Every day | ORAL | Status: DC
  Administered 2018-05-07 – 2018-05-11 (×5): 50 ug via ORAL
  Filled 2018-05-06 (×5): qty 1

## 2018-05-06 MED ORDER — HEPARIN SODIUM (PORCINE) 1000 UNIT/ML IJ SOLN
1000.0000 [IU] | Freq: Once | INTRAMUSCULAR | Status: DC
  Filled 2018-05-06: qty 1

## 2018-05-06 MED ORDER — SODIUM CHLORIDE 0.9 % IV SOLN
Freq: Three times a day (TID) | INTRAVENOUS | Status: DC
  Administered 2018-05-06 (×3): via INTRAVENOUS_CENTRAL
  Filled 2018-05-06 (×4): qty 200

## 2018-05-06 MED ORDER — ACD FORMULA A 0.73-2.45-2.2 GM/100ML VI SOLN
Status: AC
  Filled 2018-05-06: qty 500

## 2018-05-06 MED ORDER — ACD FORMULA A 0.73-2.45-2.2 GM/100ML VI SOLN
500.0000 mL | Status: DC
  Administered 2018-05-06: 500 mL via INTRAVENOUS
  Filled 2018-05-06: qty 500

## 2018-05-06 MED ORDER — DIPHENHYDRAMINE HCL 25 MG PO CAPS: 25.0000 mg | ORAL_CAPSULE | Freq: Four times a day (QID) | ORAL | Status: DC | PRN

## 2018-05-06 MED ORDER — CALCIUM CARBONATE ANTACID 500 MG PO CHEW
2.0000 | CHEWABLE_TABLET | ORAL | Status: AC
  Administered 2018-05-06 (×2): 400 mg via ORAL
  Filled 2018-05-06: qty 2

## 2018-05-06 NOTE — Progress Notes (Signed)
PROGRESS NOTE  Jason Riley EHM:[id number] DOB: [birth year] DOA: 05/02/2018 PCP: [name], MD  HPI/Recap of past 24 hours:  Reports feeling better, still has double vision when he look towards his right side.  Swallow is ok, no cough or choking with oral intake  Denies Short of breath, denies pain, denies weakness in extremities.  No fever    Assessment/Plan: Active Problems:   Aortic stenosis with mitral and aortic insufficiency   CKD (chronic kidney disease), stage III (HCC)   Coronary artery disease involving native heart   Hypertension   Hypothyroid   Myasthenia gravis ([place])   AKI (acute kidney injury) ([place])   Myasthenia gravis exacerbation -presented with increasing weakness, fatigue, seeing double and trouble swallowing. No sob. --Swallow eval recommended dysphagia 3 diet/thin liquid -Respiratory therapist to monitor NIF --Right IJ non-tunneled triple lumen HD catheter placed by IR for plasmapheresis per neurology recommendation -Continue pyridostigmine per neurology recommendation --Per [facility] urology notes , patient is recommended to start on CellCept , however he has not picked up this medication yet    Per inhouse neurology Dr Rory Percy: "-Complete 5 rounds of plasma exchange-today, Thursday and Saturday. -He can be discharged after the PLEX catheter is removed after his last plasma exchange on Saturday. -Continue on Mestinon at current doses -Management of toxic metabolic derangements per primary team -Follow-up with outpatient neurology in the next 2 weeks and will defer the decision for CellCept resumption to the outpatient neurologist."    AKI on CKD 3 -bun 50/cr 3.57 on presentation, baseline cr around 1.8-1.9 -From dehydration, UA unremarkable /renal ultrasound no hydronephrosis  -he received hydration from admission to 7/15. -bun /cr now close to baseline --Home meds lisinopril HCTZ held , do not plan to resume due to ckd -renal dosing  meds  HTN: -Home BP med held on presentation due to borderline bp and aki -Blood pressure start to trend up, will start norvasc for bp control - Not a good candidate for betablocker or ccb, he has tendency to have bradycardia -plan to discontinue home meds acei due to ckd -no a good candidate for imdur due to vasodilatation effect in the setting of aortic stenosis  aortic stenosis -Denies chest pain, syncope episode -per Chart review he declined aortic valve repair -Troponin on presentation mild and flat at 0.03 and 0.04.  EKG with lateral lead ST depression -Echocardiogram with normal wall motion, normal left ventricular EF.  He does has mild LVH, grade 1 diastolic dysfunction.  Severe aortic valve stenosis -Prevent dehydration as patient is preload dependent  Hypothyroidism -TSH 2.1, continue home meds  FTT:  PT recommended SNF at time of discharge.  Code Status: DNR  Family Communication: patient   Disposition Plan: SNF likely over the weekend after plasmapheresis   Consultants:  Neurology  IR  Procedures: Right IJ non-tunneled triple lumen HD catheter by IR Plasma exchange  Antibiotics:  none   Objective: BP (!) 147/59 (BP Location: Right Arm)   Pulse (!) 57   Temp 98.4 F (36.9 C) (Oral)   Resp 18   Ht 5\' 9"  (1.753 m)   Wt 66 kg (145 lb 8.1 oz)   SpO2 97%   BMI 21.49 kg/m   Intake/Output Summary (Last 24 hours) at 05/06/2018 0926 Last data filed at 05/06/2018 0500 Gross per 24 hour  Intake 462 ml  Output 1180 ml  Net -718 ml   Filed Weights   05/02/18 2130 05/04/18 0531 05/05/18 0500  Weight: 64.8 kg (142  lb 13.7 oz) 65.7 kg (144 lb 13.5 oz) 66 kg (145 lb 8.1 oz)    Exam: Patient is examined daily including today on 05/06/2018, exams remain the same as of yesterday except that has changed    General:  Thin, Frail, chronically ill, hard of hearing but NAD  Cardiovascular: RRR, + systolic murmur  Respiratory: CTABL  Abdomen: Soft/ND/NT,  positive BS  Musculoskeletal: No Edema  Neuro: alert, oriented   Data Reviewed: Basic Metabolic Panel: Recent Labs  Lab 05/02/18 1559 05/03/18 0444 05/03/18 1801 05/04/18 0451 05/05/18 0409 05/06/18 0236  NA 140 138 140 141 141 143  K 4.1 3.7 4.1 4.1 3.9 4.3  CL 103 105 106 115* 116* 115*  CO2 25 23  --  22 20* 24  GLUCOSE 110* 105* 86 119* 115* 122*  BUN 53* 50* 38* 31* 23 17  CREATININE 3.57* 3.17* 2.40* 2.16* 2.03* 1.97*  CALCIUM 9.8 8.9  --  8.5* 8.6* 8.7*   Liver Function Tests: Recent Labs  Lab 05/04/18 0451  AST 21  ALT 16  ALKPHOS 19*  BILITOT 0.7  PROT 4.8*  ALBUMIN 3.6   No results for input(s): LIPASE, AMYLASE in the last 168 hours. No results for input(s): AMMONIA in the last 168 hours. CBC: Recent Labs  Lab 05/02/18 1559 05/03/18 1801 05/05/18 1918  WBC 5.8  --  7.5  HGB 14.2 11.9* 12.4*  HCT 43.6 35.0* 37.1*  MCV 94.4  --  94.6  PLT 171  --  123*   Cardiac Enzymes:   Recent Labs  Lab 05/02/18 1559 05/02/18 1857 05/03/18 0444  TROPONINI 0.03* 0.04* 0.04*   BNP (last 3 results) Recent Labs    05/03/18 0444  BNP 156.3*    ProBNP (last 3 results) No results for input(s): PROBNP in the last 8760 hours.  CBG: No results for input(s): GLUCAP in the last 168 hours.  Recent Results (from the past 240 hour(s))  MRSA PCR Screening     Status: None   Collection Time: 05/02/18 11:17 PM  Result Value Ref Range Status   MRSA by PCR NEGATIVE NEGATIVE Final    Comment:        The GeneXpert MRSA Assay (FDA approved for NASAL specimens only), is one component of a comprehensive MRSA colonization surveillance program. It is not intended to diagnose MRSA infection nor to guide or monitor treatment for MRSA infections. Performed at [facility], [place] [address] [place], [place] [address]      Studies: No results found.  Scheduled Meds: . amLODipine  5 mg Oral Daily  . levothyroxine  50 mcg Oral Daily  . pyridostigmine  60  mg Oral QID    Continuous Infusions: . sodium chloride Stopped (05/03/18 1849)     Time spent: 26mins, case discussed with neurology Dr Rory Percy. I have personally reviewed and interpreted on  05/06/2018 daily labs,  imagings as discussed above under date review session and assessment and plans.  I reviewed all nursing notes, pharmacy notes, consultant notes,  vitals, pertinent old records  I have discussed plan of care as described above with RN , patient  on 05/06/2018   Florencia Reasons MD, PhD  [facility] Pager [phone]. If 7PM-7AM, please contact night-coverage at [email or web address], password [facility] 05/06/2018, 9:26 AM  LOS: 4 days

## 2018-05-06 NOTE — Progress Notes (Signed)
Physical Therapy Treatment Patient Details Name: Jason Riley MRN: [id number] DOB: January 15, 1929 Today's Date: 05/06/2018    History of Present Illness 84 y.o. male with past medical history of seropositive myasthenia gravis diagnosed in January 2019, prostate cancer presents to [facility] emergency room for worsening symptoms of weakness, double vision and shortness of breath on exertion as well as worsening dysphagia.    PT Comments    Pt agreeable to therapy today, stating "anything to get me out of this room". Pt continues to be limited in safe mobility by visual disturbances and related decreased balance. Pt currently mod I for bed mobility, min guard for transfers and supervision for hallway ambulation with RW. When attempting to ambulate without RW, pt requires at least min guard assist, secondary to decreased balance and narrow BoS. After 30 feet of ambulation pt requests to have RW back. PT continues to recommend SNF level rehab at d/c for improving balance and endurance.      Follow Up Recommendations  SNF;Supervision for mobility/OOB     Equipment Recommendations  Rolling walker with 5" wheels    Recommendations for Other Services OT consult     Precautions / Restrictions Precautions Precautions: Fall Restrictions Weight Bearing Restrictions: No    Mobility  Bed Mobility Overal bed mobility: Modified Independent                Transfers Overall transfer level: Needs assistance Equipment used: Rolling walker (2 wheeled) Transfers: Sit to/from Stand Sit to Stand: Min guard         General transfer comment: requires vc for proper hand placement for power up   Ambulation/Gait Ambulation/Gait assistance: Min guard;Supervision   Assistive device: Rolling walker (2 wheeled) Gait Pattern/deviations: Step-to pattern;Step-through pattern Gait velocity: decreased Gait velocity interpretation: <1.8 ft/sec, indicate of risk for recurrent falls General Gait Details:  supervision for ambulation with RW, slow and steady with narrow BoS, trialed ambulation without AD, able to ambulate 25 feet with min guard A, no overt loss of balance, but drifts L/R and reached out for handrail in hallway., returned to ambulation with RW       Balance Overall balance assessment: Needs assistance   Sitting balance-Leahy Scale: Good       Standing balance-Leahy Scale: Fair                              Cognition Arousal/Alertness: Awake/alert Behavior During Therapy: WFL for tasks assessed/performed Overall Cognitive Status: Within Functional Limits for tasks assessed                                           General Comments General comments (skin integrity, edema, etc.): Pt continues to have double vision and is wearing eye patch on entry, prefers to use just bifocals for ambulation, VSS       Pertinent Vitals/Pain Pain Assessment: No/denies pain    Home Living Family/patient expects to be discharged to:: Private residence Living Arrangements: Alone Available Help at Discharge: Family;Available PRN/intermittently Type of Home: House Home Access: Ramped entrance   Home Layout: One level Home Equipment: None      Prior Function Level of Independence: Independent      Comments: x1 fall known, no AD, driving, independent with all ADLs    PT Goals (current goals can now be found in the  care plan section) Acute Rehab PT Goals Patient Stated Goal: non stated PT Goal Formulation: With patient Progress towards PT goals: Progressing toward goals    Frequency    Min 3X/week      PT Plan Current plan remains appropriate       AM-PAC PT "6 Clicks" Daily Activity  Outcome Measure  Difficulty turning over in bed (including adjusting bedclothes, sheets and blankets)?: None Difficulty moving from lying on back to sitting on the side of the bed? : None Difficulty sitting down on and standing up from a chair with arms  (e.g., wheelchair, bedside commode, etc,.)?: None Help needed moving to and from a bed to chair (including a wheelchair)?: A Little Help needed walking in hospital room?: A Little Help needed climbing 3-5 steps with a railing? : A Little 6 Click Score: 21    End of Session Equipment Utilized During Treatment: Gait belt Activity Tolerance: Patient tolerated treatment well Patient left: in bed;with call bell/phone within reach;with family/visitor present Nurse Communication: Mobility status PT Visit Diagnosis: Unsteadiness on feet (R26.81);Muscle weakness (generalized) (M62.81)     Time: 5366-4403 PT Time Calculation (min) (ACUTE ONLY): 20 min  Charges:  $Gait Training: 8-22 mins                    G Codes:       Chelli Yerkes B. Migdalia Dk PT, DPT Acute Rehabilitation  [phone] Pager [phone]     [place] 05/06/2018, 12:14 PM

## 2018-05-06 NOTE — Progress Notes (Signed)
NIV > -40 VC 2.5L  Good patient effort.

## 2018-05-06 NOTE — Progress Notes (Signed)
RT NOTE:  NIF < -40 VC 2.9L  Patient demonstrated good effort.

## 2018-05-06 NOTE — Progress Notes (Signed)
IV Team: Received consult for PIV. Arrived as pt was being transported to dialysis. Spoke with nurse, Lowella Dandy: Recommended he coordinate with dialysis nurse for calcium administration. Lowella Dandy will re-enter consult if PIV is needed when pt returns.

## 2018-05-06 NOTE — Progress Notes (Signed)
NIF greater than -40 VC 2.5L

## 2018-05-06 NOTE — Progress Notes (Signed)
Neurology Progress Note   S:// Seen and examined. No acute changes.  O:// Current vital signs: BP (!) 147/59 (BP Location: Right Arm)   Pulse (!) 57   Temp 98.4 F (36.9 C) (Oral)   Resp 18   Ht 5\' 9"  (1.753 m)   Wt 66 kg (145 lb 8.1 oz)   SpO2 97%   BMI 21.49 kg/m  Vital signs in last 24 hours: Temp:  [98.4 F (36.9 C)-99.1 F (37.3 C)] 98.4 F (36.9 C) (07/16 0500) Pulse Rate:  [57-67] 57 (07/16 0500) Resp:  [15-25] 18 (07/16 0500) BP: (114-161)/(44-83) 147/59 (07/16 0500) SpO2:  [97 %-99 %] 97 % (07/16 0500) GENERAL: Awake, alert in NAD HEENT: - Normocephalic and atraumatic, dry mm, no LN++, no Thyromegally LUNGS - Clear to auscultation bilaterally with no wheezes CV - S1S2 RRR, no m/r/g, equal pulses bilaterally. ABDOMEN - Soft, nontender, nondistended with normoactive BS Ext: warm, well perfused, intact peripheral pulses, no edema NEURO:  Mental Status: AA&Ox2. Attention concentration is mildly reduced Language: speech is clear.  Naming, repetition, fluency, and comprehension intact. Cranial Nerves: PERRL. EOMI with no diplopia sustained upward gaze, visual fields full, no facial asymmetry, facial sensation intact, hearing reduced bilaterally-had hearing aids in place, tongue/uvula/soft palate midline, normal sternocleidomastoid and trapezius muscle strength. No evidence of tongue atrophy or fibrillations Motor: Symmetric 5/5 all over with no fatigability. Tone: is normal and bulk is normal Sensation- Intact to light touch bilaterally Coordination: FTN intact bilaterally Gait- deferred   Medications  Current Facility-Administered Medications:  .  amLODipine (NORVASC) tablet 5 mg, 5 mg, Oral, Daily, Florencia Reasons, MD, 5 mg at 05/05/18 1358 .  levothyroxine (SYNTHROID, LEVOTHROID) tablet 50 mcg, 50 mcg, Oral, Daily, Wouk, Ailene Rud, MD, 50 mcg at 05/05/18 0912 .  lidocaine (XYLOCAINE) 1 % (with pres) injection, , Infiltration, PRN, Jacqulynn Cadet, MD, 10 mL at  05/03/18 1431 .  pyridostigmine (MESTINON) tablet 60 mg, 60 mg, Oral, QID, Wouk, Ailene Rud, MD, 60 mg at 05/05/18 2130 .  sodium chloride 0.9 % bolus 1,000 mL, 1,000 mL, Intravenous, Once, Florencia Reasons, MD, Stopped at 05/03/18 1849 Labs CBC    Component Value Date/Time   WBC 7.5 05/05/2018 1918   RBC 3.92 (L) 05/05/2018 1918   HGB 12.4 (L) 05/05/2018 1918   HCT 37.1 (L) 05/05/2018 1918   PLT 123 (L) 05/05/2018 1918   MCV 94.6 05/05/2018 1918   MCH 31.6 05/05/2018 1918   MCHC 33.4 05/05/2018 1918   RDW 12.7 05/05/2018 1918    CMP     Component Value Date/Time   NA 143 05/06/2018 0236   NA 145 (H) 11/26/2017 0817   K 4.3 05/06/2018 0236   CL 115 (H) 05/06/2018 0236   CO2 24 05/06/2018 0236   GLUCOSE 122 (H) 05/06/2018 0236   BUN 17 05/06/2018 0236   BUN 27 11/26/2017 0817   CREATININE 1.97 (H) 05/06/2018 0236   CALCIUM 8.7 (L) 05/06/2018 0236   PROT 4.8 (L) 05/04/2018 0451   PROT 6.6 11/26/2017 0817   ALBUMIN 3.6 05/04/2018 0451   ALBUMIN 4.1 11/26/2017 0817   AST 21 05/04/2018 0451   ALT 16 05/04/2018 0451   ALKPHOS 19 (L) 05/04/2018 0451   BILITOT 0.7 05/04/2018 0451   BILITOT 0.5 11/26/2017 0817   GFRNONAA 29 (L) 05/06/2018 0236   GFRAA 33 (L) 05/06/2018 0236   Assessment:  84 year old past history of myasthenia gravis diagnosed early 2019, history of breast cancer presented for worsening weakness, double vision  shortness of breath on exertion as well as worsening dysphagia.  Currently being treated for possible myasthenic exacerbation.  Clinically doing better than prior documented exams. He was supposed to start CellCept per his outpatient neurologist which she has not picked up yet. He does have outpatient neurology care.  Impression: Possible myasthenia exacerbation  Recommendations: -Complete 5 rounds of plasma exchange-today, Thursday and Saturday. -He can be discharged after the PLEX catheter is removed after his last plasma exchange on Saturday. -Continue  on Mestinon at current doses -Management of toxic metabolic derangements per primary team -Follow-up with outpatient neurology in the next 2 weeks and will defer the decision for CellCept resumption to the outpatient neurologist.  Neurology will be available as needed  -- Amie Portland, MD [facility] Pager: [phone] If 7pm to 7am, please call on call as listed on AMION.

## 2018-05-07 DIAGNOSIS — I080 Rheumatic disorders of both mitral and aortic valves: Secondary | ICD-10-CM

## 2018-05-07 DIAGNOSIS — R7989 Other specified abnormal findings of blood chemistry: Secondary | ICD-10-CM

## 2018-05-07 LAB — BASIC METABOLIC PANEL
ANION GAP: 2 — AB (ref 5–15)
BUN: 19 mg/dL (ref 8–23)
CALCIUM: 8.4 mg/dL — AB (ref 8.9–10.3)
CO2: 20 mmol/L — ABNORMAL LOW (ref 22–32)
CREATININE: 1.76 mg/dL — AB (ref 0.61–1.24)
Chloride: 119 mmol/L — ABNORMAL HIGH (ref 98–111)
GFR, EST AFRICAN AMERICAN: 38 mL/min — AB (ref >60)
GFR, EST NON AFRICAN AMERICAN: 33 mL/min — AB (ref >60)
Glucose, Bld: 106 mg/dL — ABNORMAL HIGH (ref 70–99)
Potassium: 4.1 mmol/L (ref 3.5–5.1)
SODIUM: 141 mmol/L (ref 135–145)

## 2018-05-07 NOTE — Progress Notes (Signed)
PROGRESS NOTE    Jason Riley  AYT:[id number] DOB: [birth year] DOA: 05/02/2018 PCP: [name], MD    Brief Narrative:  84 y.o. male with medical history significant for recently diagnosed myasthenia gravis (followed by [facility]), prostate cancer s/p prostatectomy 2001, severe AS (seen on 2015 TTE), CKD3, HTN, hypothyroid, who lives alone at home, presenting with the above.  Symptoms have been worsening for a month or two. Generalized weakness. Once fell and really struggled to get back up. Having continued double vision. Also occasional choking. Lots of unsteadiness. No chest pain or SOB. No fevers. No melena. No focal symptoms. Spoke with his neurologist who advised presenting to [facility] for admission for possible myasthenia flare but patient opted to come here as much closer to his home. No new orthopnea or LE swelling. Thinks has been eating and drinking a bit less than normal 2/2 fatigue and trouble chewing and swallowing  Assessment & Plan:   Active Problems:   Aortic stenosis with mitral and aortic insufficiency   CKD (chronic kidney disease), stage III (HCC)   Coronary artery disease involving native heart   Hypertension   Hypothyroid   Myasthenia gravis ([place])   AKI (acute kidney injury) ([place])  Myasthenia gravis exacerbation -presented with increasing weakness, fatigue, seeing double and trouble swallowing. No sob. --Swallow eval recommended dysphagia 3 diet/thin liquid -Respiratory therapist to monitor NIF --Right IJ non-tunneled triple lumen HD catheter placed by IR for plasmapheresis per neurology recommendation -Continue pyridostigmine per neurology recommendation --Per [facility] urology notes , patient is recommended to start on CellCept , however he has not picked up this medication yet  - Patient to continue plasmapharesis through 7/20   Per inhouse neurology Dr Rory Percy: Marland KitchenComplete 5 rounds of plasma exchange-today, Thursday and Saturday. -He can be discharged after  thePLEXcatheterisremoved after his last plasma exchange on Saturday. -Continue on Mestinon at current doses -Management of toxic metabolic derangements per primary team -Follow-up with outpatient neurology in the next 2 weeks and will defer the decision for CellCept resumption to the outpatient neurologist."    AKI on CKD 3 -bun 50/cr 3.57 on presentation, baseline cr around 1.8-1.9 -From dehydration, UA unremarkable /renal ultrasound no hydronephrosis  -he received hydration from admission to 7/15. -bun /cr now close to baseline --Home meds lisinopril HCTZ held , do not plan to resume due to ckd -renal dosing meds. Stable  HTN: -Home BP med held on presentation due to borderline bp and aki -Blood pressure start to trend up, will start norvasc for bp control - Not a good candidate for betablocker or ccb, he has tendency to have bradycardia -plan to discontinue home meds acei due to ckd -no a good candidate for imdur due to vasodilatation effect in the setting of aortic stenosis  aortic stenosis -Denies chest pain, syncope episode -per Chart review he declined aortic valve repair -Troponin on presentation mild and flat at 0.03 and 0.04.  EKG with lateral lead ST depression -Echocardiogram with normal wall motion, normal left ventricular EF.  He does has mild LVH, grade 1 diastolic dysfunction.  Severe aortic valve stenosis -Prevent dehydration as patient is preload dependent  Hypothyroidism -TSH 2.1, continue home meds  FTT:  PT recommended SNF at time of discharge.   DVT prophylaxis: SCD's Code Status: DNR Family Communication: Pt in room, family not at bedside Disposition Plan: Possible d/c after plasmapharesis on 7/20  Consultants:   Nephrology  Neurology  Procedures:     Antimicrobials: Anti-infectives (From admission, onward)  None       Subjective: No complaints this AM  Objective: Vitals:   05/06/18 1900 05/06/18 2129 05/07/18 0449  05/07/18 0500  BP: (!) 121/39 (!) 114/51 (!) 131/56   Pulse: 62 61 (!) 59   Resp: 16 (!) 24 (!) 22   Temp: (!) 97.4 F (36.3 C) 97.9 F (36.6 C) 98.5 F (36.9 C)   TempSrc: Oral Oral Oral   SpO2: 99% 99% 98%   Weight:    69.8 kg (153 lb 14.1 oz)  Height:        Intake/Output Summary (Last 24 hours) at 05/07/2018 1410 Last data filed at 05/07/2018 1005 Gross per 24 hour  Intake 417 ml  Output 500 ml  Net -83 ml   Filed Weights   05/04/18 0531 05/05/18 0500 05/07/18 0500  Weight: 65.7 kg (144 lb 13.5 oz) 66 kg (145 lb 8.1 oz) 69.8 kg (153 lb 14.1 oz)    Examination:  General exam: Appears calm and comfortable  Respiratory system: Clear to auscultation. Respiratory effort normal. Cardiovascular system: S1 & S2 heard, RRR. No JVD, murmurs, rubs, gallops or clicks. No pedal edema.  Data Reviewed: I have personally reviewed following labs and imaging studies  CBC: Recent Labs  Lab 05/02/18 1559 05/03/18 1801 05/05/18 1806 05/05/18 1918  WBC 5.8  --   --  7.5  HGB 14.2 11.9* 11.6* 12.4*  HCT 43.6 35.0* 34.0* 37.1*  MCV 94.4  --   --  94.6  PLT 171  --   --  892*   Basic Metabolic Panel: Recent Labs  Lab 05/03/18 0444  05/04/18 0451 05/05/18 0409 05/05/18 1806 05/06/18 0236 05/07/18 0341  NA 138   < > 141 141 141 143 141  K 3.7   < > 4.1 3.9 4.4 4.3 4.1  CL 105   < > 115* 116* 109 115* 119*  CO2 23  --  22 20*  --  24 20*  GLUCOSE 105*   < > 119* 115* 118* 122* 106*  BUN 50*   < > 31* 23 21 17 19   CREATININE 3.17*   < > 2.16* 2.03* 1.90* 1.97* 1.76*  CALCIUM 8.9  --  8.5* 8.6*  --  8.7* 8.4*   < > = values in this interval not displayed.   GFR: Estimated Creatinine Clearance: 29.2 mL/min (A) (by C-G formula based on SCr of 1.76 mg/dL (H)). Liver Function Tests: Recent Labs  Lab 05/04/18 0451  AST 21  ALT 16  ALKPHOS 19*  BILITOT 0.7  PROT 4.8*  ALBUMIN 3.6   No results for input(s): LIPASE, AMYLASE in the last 168 hours. No results for input(s):  AMMONIA in the last 168 hours. Coagulation Profile: Recent Labs  Lab 05/03/18 0723  INR 1.15   Cardiac Enzymes: Recent Labs  Lab 05/02/18 1559 05/02/18 1857 05/03/18 0444  TROPONINI 0.03* 0.04* 0.04*   BNP (last 3 results) No results for input(s): PROBNP in the last 8760 hours. HbA1C: No results for input(s): HGBA1C in the last 72 hours. CBG: No results for input(s): GLUCAP in the last 168 hours. Lipid Profile: No results for input(s): CHOL, HDL, LDLCALC, TRIG, CHOLHDL, LDLDIRECT in the last 72 hours. Thyroid Function Tests: No results for input(s): TSH, T4TOTAL, FREET4, T3FREE, THYROIDAB in the last 72 hours. Anemia Panel: No results for input(s): VITAMINB12, FOLATE, FERRITIN, TIBC, IRON, RETICCTPCT in the last 72 hours. Sepsis Labs: No results for input(s): PROCALCITON, LATICACIDVEN in the last 168 hours.  Recent  Results (from the past 240 hour(s))  MRSA PCR Screening     Status: None   Collection Time: 05/02/18 11:17 PM  Result Value Ref Range Status   MRSA by PCR NEGATIVE NEGATIVE Final    Comment:        The GeneXpert MRSA Assay (FDA approved for NASAL specimens only), is one component of a comprehensive MRSA colonization surveillance program. It is not intended to diagnose MRSA infection nor to guide or monitor treatment for MRSA infections. Performed at [facility], [place] [address], [place],  [address]      Radiology Studies: No results found.  Scheduled Meds: . amLODipine  5 mg Oral Daily  . levothyroxine  50 mcg Oral QAC breakfast  . pyridostigmine  60 mg Oral QID   Continuous Infusions: . sodium chloride Stopped (05/03/18 1849)     LOS: 5 days   Marylu Lund, MD [facility] Pager [phone]  If 7PM-7AM, please contact night-coverage [email or web address] Password [email or web address] 05/07/2018, 2:10 PM

## 2018-05-07 NOTE — Progress Notes (Signed)
Occupational Therapy Treatment Patient Details Name: Jason Riley MRN: [id number] DOB: April 20, 1929 Today's Date: 05/07/2018    History of present illness 84 y.o. male with past medical history of seropositive myasthenia gravis diagnosed in January 2019, prostate cancer presents to [facility] emergency room for worsening symptoms of weakness, double vision and shortness of breath on exertion as well as worsening dysphagia.   OT comments  Pt demonstrating good progress toward OT goals this session. He reports double vision is "mostly gone" except in extreme R gaze. However, once standing at sink, pt reporting that he "doesn't think" he is having any double vision at midline gaze. Thus, attempted partial occlusion with no change. He additionally was able to read without eye patch today. Pt educated concerning low vision strategies. Pt reports it is difficult to express his visual deficits due to L eye low vision in general. He is able to complete toilet transfers with supervision this session and ambulate throughout unit without RW this session. Recommended that pt refrain from driving until he is cleared by MD. Pt may continue to benefit from SNF placement due to being home alone but if able to have assistance from family, may need to update recommendation to home health OT as pt is progressing well.    Follow Up Recommendations  Supervision/Assistance - 24 hour;SNF    Equipment Recommendations  Tub/shower seat    Recommendations for Other Services      Precautions / Restrictions Precautions Precautions: Fall Restrictions Weight Bearing Restrictions: No       Mobility Bed Mobility               General bed mobility comments: OOB in chair on my arrival.   Transfers Overall transfer level: Modified independent Equipment used: None Transfers: Sit to/from Stand Sit to Stand: Modified independent (Device/Increase time)              Balance Overall balance assessment: Needs  assistance Sitting-balance support: No upper extremity supported;Feet supported Sitting balance-Leahy Scale: Good     Standing balance support: Bilateral upper extremity supported;No upper extremity supported;During functional activity Standing balance-Leahy Scale: Fair                             ADL either performed or assessed with clinical judgement   ADL Overall ADL's : Needs assistance/impaired     Grooming: Standing;Modified independent Grooming Details (indicate cue type and reason): no depth perception deficits noted                 Toilet Transfer: Supervision/safety;Ambulation Toilet Transfer Details (indicate cue type and reason): no RW utilized this session         Functional mobility during ADLs: Supervision/safety General ADL Comments: Pt with improvement in functional use of vision this session. Able to navigate hallway and read without difficulty.      Vision   Vision Assessment?: Yes Diplopia Assessment: Other (comment);Only with right gaze(only with end range R gaze) Depth Perception: (no deficits) Additional Comments: Pt with much improved functional vision this session. Reports "most of the time" double vision is resolved with midline gaze. Some double vision present at extreme R gaze but when able to compensate by moving head along with eyes, no deficits noted. Pt able to read without eye patch today. Did attempt taping glasses for partial occlusion when standing at sink as pt reporting "mostly no double vision" to see if this completely cleared. No change with  taping. Pt reports that vision is generally blurry due to L eye vision deficits at baseline.    Perception     Praxis      Cognition Arousal/Alertness: Awake/alert Behavior During Therapy: WFL for tasks assessed/performed Overall Cognitive Status: Within Functional Limits for tasks assessed                                          Exercises     Shoulder  Instructions       General Comments Educated pt concerning low vision strategies and he would benefit from further reinforcement.     Pertinent Vitals/ Pain       Pain Assessment: No/denies pain  Home Living                                          Prior Functioning/Environment              Frequency  Min 2X/week        Progress Toward Goals  OT Goals(current goals can now be found in the care plan section)  Progress towards OT goals: Progressing toward goals  Acute Rehab OT Goals Patient Stated Goal: to take a walk OT Goal Formulation: With patient Time For Goal Achievement: 05/19/18 Potential to Achieve Goals: Good  Plan Discharge plan remains appropriate    Co-evaluation                 AM-PAC PT "6 Clicks" Daily Activity     Outcome Measure   Help from another person eating meals?: None Help from another person taking care of personal grooming?: None Help from another person toileting, which includes using toliet, bedpan, or urinal?: A Little Help from another person bathing (including washing, rinsing, drying)?: A Little Help from another person to put on and taking off regular upper body clothing?: None Help from another person to put on and taking off regular lower body clothing?: None 6 Click Score: 22    End of Session Equipment Utilized During Treatment: Rolling walker  OT Visit Diagnosis: Other abnormalities of gait and mobility (R26.89);Low vision, both eyes (H54.2)   Activity Tolerance Patient tolerated treatment well   Patient Left in bed;with call bell/phone within reach;with bed alarm set   Nurse Communication          Time: 7106-2694 OT Time Calculation (min): 19 min  Charges: OT General Charges $OT Visit: 1 Visit OT Treatments $Self Care/Home Management : 8-22 mins  Norman Herrlich, MS OTR/L  Pager: Gaston A Niklaus Mamaril 05/07/2018, 5:48 PM

## 2018-05-07 NOTE — Care Management Note (Signed)
Case Management Note  Patient Details  Name: Jason Riley MRN: [id number] Date of Birth: 09-16-1929  Subjective/Objective:    Admitted with Myasthenia gravis exacerbation.Recently diagnosed myasthenia gravis (followed by [facility]), prostate cancer s/p prostatectomy 2001, severe AS , CKD3, HTN, hypothyroid.   Cammy Brochure (Other)     [phone]      PCP: Lelon Huh  Action/Plan: Transition to SNF when medically stable ( completion of 5 rounds of plasma exchange).  Per PT's recommendation: SNF... pt agreeable to SNF placement. CSW managing disposition to facility.  Expected Discharge Date:    05/10/2018           Expected Discharge Plan:  [place]  In-House Referral:  Clinical Social Work  Discharge planning Services  CM Consult  Status of Service:  completed  If discussed at [employer] of Stay Meetings, dates discussed:    Additional Comments:  Sharin Mons, RN 05/07/2018, 10:44 AM

## 2018-05-07 NOTE — Progress Notes (Signed)
NIF -40+ VC 2.1L

## 2018-05-07 NOTE — Progress Notes (Signed)
Pulmonary mechanics done-  NIF -35 cmh2o FVC 2.35 L

## 2018-05-08 DIAGNOSIS — I10 Essential (primary) hypertension: Secondary | ICD-10-CM

## 2018-05-08 LAB — POCT I-STAT, CHEM 8
BUN: 19 mg/dL (ref 8–23)
CALCIUM ION: 1.3 mmol/L (ref 1.15–1.40)
CHLORIDE: 109 mmol/L (ref 98–111)
CREATININE: 1.5 mg/dL — AB (ref 0.61–1.24)
GLUCOSE: 85 mg/dL (ref 70–99)
HCT: 30 % — ABNORMAL LOW (ref 39.0–52.0)
Hemoglobin: 10.2 g/dL — ABNORMAL LOW (ref 13.0–17.0)
Potassium: 4.1 mmol/L (ref 3.5–5.1)
Sodium: 143 mmol/L (ref 135–145)
TCO2: 20 mmol/L — ABNORMAL LOW (ref 22–32)

## 2018-05-08 LAB — BASIC METABOLIC PANEL
Anion gap: 6 (ref 5–15)
BUN: 19 mg/dL (ref 8–23)
CO2: 22 mmol/L (ref 22–32)
CREATININE: 1.8 mg/dL — AB (ref 0.61–1.24)
Calcium: 8.8 mg/dL — ABNORMAL LOW (ref 8.9–10.3)
Chloride: 114 mmol/L — ABNORMAL HIGH (ref 98–111)
GFR calc Af Amer: 37 mL/min — ABNORMAL LOW (ref >60)
GFR, EST NON AFRICAN AMERICAN: 32 mL/min — AB (ref >60)
Glucose, Bld: 99 mg/dL (ref 70–99)
Potassium: 4.5 mmol/L (ref 3.5–5.1)
SODIUM: 142 mmol/L (ref 135–145)

## 2018-05-08 LAB — CBC
HCT: 32.7 % — ABNORMAL LOW (ref 39.0–52.0)
Hemoglobin: 10.8 g/dL — ABNORMAL LOW (ref 13.0–17.0)
MCH: 31.4 pg (ref 26.0–34.0)
MCHC: 33 g/dL (ref 30.0–36.0)
MCV: 95.1 fL (ref 78.0–100.0)
Platelets: 120 10*3/uL — ABNORMAL LOW (ref 150–400)
RBC: 3.44 MIL/uL — ABNORMAL LOW (ref 4.22–5.81)
RDW: 13.1 % (ref 11.5–15.5)
WBC: 11 10*3/uL — AB (ref 4.0–10.5)

## 2018-05-08 MED ORDER — SODIUM CHLORIDE 0.9 % IV SOLN
2.0000 g | Freq: Once | INTRAVENOUS | Status: AC
  Administered 2018-05-08: 2 g via INTRAVENOUS
  Filled 2018-05-08: qty 20

## 2018-05-08 MED ORDER — DIPHENHYDRAMINE HCL 25 MG PO CAPS: 25.0000 mg | ORAL_CAPSULE | Freq: Four times a day (QID) | ORAL | Status: DC | PRN

## 2018-05-08 MED ORDER — ACETAMINOPHEN 325 MG PO TABS: 650.0000 mg | ORAL_TABLET | ORAL | Status: DC | PRN

## 2018-05-08 MED ORDER — SODIUM CHLORIDE 0.9 % IV SOLN
INTRAVENOUS | Status: AC
  Administered 2018-05-08 (×3): via INTRAVENOUS_CENTRAL
  Filled 2018-05-08 (×3): qty 200

## 2018-05-08 MED ORDER — CALCIUM CARBONATE ANTACID 500 MG PO CHEW
CHEWABLE_TABLET | ORAL | Status: AC
  Administered 2018-05-08: 400 mg via ORAL
  Filled 2018-05-08: qty 4

## 2018-05-08 MED ORDER — HEPARIN SODIUM (PORCINE) 1000 UNIT/ML IJ SOLN
1000.0000 [IU] | Freq: Once | INTRAMUSCULAR | Status: DC
  Filled 2018-05-08: qty 1

## 2018-05-08 MED ORDER — ACD FORMULA A 0.73-2.45-2.2 GM/100ML VI SOLN
500.0000 mL | Status: DC
  Administered 2018-05-08: 500 mL via INTRAVENOUS
  Filled 2018-05-08 (×2): qty 500

## 2018-05-08 MED ORDER — CALCIUM CARBONATE ANTACID 500 MG PO CHEW
2.0000 | CHEWABLE_TABLET | ORAL | Status: AC
  Administered 2018-05-08: 400 mg via ORAL

## 2018-05-08 NOTE — Progress Notes (Signed)
PROGRESS NOTE    Jason Riley  ZOX:[id number] DOB: [birth year] DOA: 05/02/2018 PCP: [name], MD    Brief Narrative:  84 y.o. male with medical history significant for recently diagnosed myasthenia gravis (followed by [facility]), prostate cancer s/p prostatectomy 2001, severe AS (seen on 2015 TTE), CKD3, HTN, hypothyroid, who lives alone at home, presenting with the above.  Symptoms have been worsening for a month or two. Generalized weakness. Once fell and really struggled to get back up. Having continued double vision. Also occasional choking. Lots of unsteadiness. No chest pain or SOB. No fevers. No melena. No focal symptoms. Spoke with his neurologist who advised presenting to [facility] for admission for possible myasthenia flare but patient opted to come here as much closer to his home. No new orthopnea or LE swelling. Thinks has been eating and drinking a bit less than normal 2/2 fatigue and trouble chewing and swallowing  Assessment & Plan:   Active Problems:   Aortic stenosis with mitral and aortic insufficiency   CKD (chronic kidney disease), stage III (HCC)   Coronary artery disease involving native heart   Hypertension   Hypothyroid   Myasthenia gravis ([place])   AKI (acute kidney injury) ([place])  Myasthenia gravis exacerbation -presented with increasing weakness, fatigue, seeing double and trouble swallowing. No sob. --Swallow eval recommended dysphagia 3 diet/thin liquid -Respiratory therapist to monitor NIF --Right IJ non-tunneled triple lumen HD catheter placed by IR for plasmapheresis per neurology recommendation -Continue pyridostigmine per neurology recommendation --Per [facility] urology notes , patient is recommended to start on CellCept , however he has not picked up this medication yet  - Will continue plasmapharesis through 7/20. Pt underwent pharesis this AM without difficulty   Per inhouse neurology Dr Rory Percy: "-Complete 5 rounds of plasma exchange-today, Thursday and  Saturday. -He can be discharged after thePLEXcatheterisremoved after his last plasma exchange on Saturday. -Continue on Mestinon at current doses -Management of toxic metabolic derangements per primary team -Follow-up with outpatient neurology in the next 2 weeks and will defer the decision for CellCept resumption to the outpatient neurologist."    AKI on CKD 3 -bun 50/cr 3.57 on presentation, baseline cr around 1.8-1.9 -From dehydration, UA unremarkable /renal ultrasound no hydronephrosis  -he received hydration from admission to 7/15. -bun /cr now close to baseline --Home meds lisinopril HCTZ held , do not plan to resume due to ckd -renal dosing meds. Stable  HTN: -Home BP med held on presentation due to borderline bp and aki -Blood pressure start to trend up, will start norvasc for bp control - Not a good candidate for betablocker or ccb, he has tendency to have bradycardia -plan to discontinue home meds acei due to ckd -no a good candidate for imdur due to vasodilatation effect in the setting of aortic stenosis  aortic stenosis -Denies chest pain, syncope episode -per Chart review he declined aortic valve repair -Troponin on presentation mild and flat at 0.03 and 0.04.  EKG with lateral lead ST depression -Echocardiogram with normal wall motion, normal left ventricular EF.  He does has mild LVH, grade 1 diastolic dysfunction.  Severe aortic valve stenosis -Prevent dehydration as patient is preload dependent  Hypothyroidism -TSH 2.1, continue home meds  FTT:  PT recommended SNF at time of discharge.   DVT prophylaxis: SCD's Code Status: DNR Family Communication: Pt in room, family not at bedside Disposition Plan: Possible d/c after plasmapharesis on 7/20  Consultants:   Nephrology  Neurology  Procedures:     Antimicrobials:  Anti-infectives (From admission, onward)   None      Subjective: No complaints  Objective: Vitals:   05/08/18 1119  05/08/18 1134 05/08/18 1143 05/08/18 1316  BP: (!) 98/48 (!) 94/46 (!) 110/52 (!) 101/52  Pulse:    70  Resp: (!) 24 19 (!) 21 19  Temp: 98.7 F (37.1 C) 98.7 F (37.1 C) 98.7 F (37.1 C) 98.5 F (36.9 C)  TempSrc: Oral Oral Oral Oral  SpO2:   98% 100%  Weight:      Height:        Intake/Output Summary (Last 24 hours) at 05/08/2018 1516 Last data filed at 05/08/2018 1500 Gross per 24 hour  Intake 1332.75 ml  Output 500 ml  Net 832.75 ml   Filed Weights   05/05/18 0500 05/07/18 0500 05/08/18 0622  Weight: 66 kg (145 lb 8.1 oz) 69.8 kg (153 lb 14.1 oz) 63.9 kg (140 lb 12.8 oz)    Examination: General exam: Conversant, in no acute distress Respiratory system: normal chest rise, clear, no audible wheezing Cardiovascular system: regular rhythm, s1-s2  Data Reviewed: I have personally reviewed following labs and imaging studies  CBC: Recent Labs  Lab 05/02/18 1559 05/03/18 1801 05/05/18 1806 05/05/18 1918 05/08/18 0423 05/08/18 1025  WBC 5.8  --   --  7.5 11.0*  --   HGB 14.2 11.9* 11.6* 12.4* 10.8* 10.2*  HCT 43.6 35.0* 34.0* 37.1* 32.7* 30.0*  MCV 94.4  --   --  94.6 95.1  --   PLT 171  --   --  123* 120*  --    Basic Metabolic Panel: Recent Labs  Lab 05/04/18 0451 05/05/18 0409 05/05/18 1806 05/06/18 0236 05/07/18 0341 05/08/18 0423 05/08/18 1025  NA 141 141 141 143 141 142 143  K 4.1 3.9 4.4 4.3 4.1 4.5 4.1  CL 115* 116* 109 115* 119* 114* 109  CO2 22 20*  --  24 20* 22  --   GLUCOSE 119* 115* 118* 122* 106* 99 85  BUN 31* 23 21 17 19 19 19   CREATININE 2.16* 2.03* 1.90* 1.97* 1.76* 1.80* 1.50*  CALCIUM 8.5* 8.6*  --  8.7* 8.4* 8.8*  --    GFR: Estimated Creatinine Clearance: 31.4 mL/min (A) (by C-G formula based on SCr of 1.5 mg/dL (H)). Liver Function Tests: Recent Labs  Lab 05/04/18 0451  AST 21  ALT 16  ALKPHOS 19*  BILITOT 0.7  PROT 4.8*  ALBUMIN 3.6   No results for input(s): LIPASE, AMYLASE in the last 168 hours. No results for  input(s): AMMONIA in the last 168 hours. Coagulation Profile: Recent Labs  Lab 05/03/18 0723  INR 1.15   Cardiac Enzymes: Recent Labs  Lab 05/02/18 1559 05/02/18 1857 05/03/18 0444  TROPONINI 0.03* 0.04* 0.04*   BNP (last 3 results) No results for input(s): PROBNP in the last 8760 hours. HbA1C: No results for input(s): HGBA1C in the last 72 hours. CBG: No results for input(s): GLUCAP in the last 168 hours. Lipid Profile: No results for input(s): CHOL, HDL, LDLCALC, TRIG, CHOLHDL, LDLDIRECT in the last 72 hours. Thyroid Function Tests: No results for input(s): TSH, T4TOTAL, FREET4, T3FREE, THYROIDAB in the last 72 hours. Anemia Panel: No results for input(s): VITAMINB12, FOLATE, FERRITIN, TIBC, IRON, RETICCTPCT in the last 72 hours. Sepsis Labs: No results for input(s): PROCALCITON, LATICACIDVEN in the last 168 hours.  Recent Results (from the past 240 hour(s))  MRSA PCR Screening     Status: None   Collection Time:  05/02/18 11:17 PM  Result Value Ref Range Status   MRSA by PCR NEGATIVE NEGATIVE Final    Comment:        The GeneXpert MRSA Assay (FDA approved for NASAL specimens only), is one component of a comprehensive MRSA colonization surveillance program. It is not intended to diagnose MRSA infection nor to guide or monitor treatment for MRSA infections. Performed at [facility], [place] [address], [place],  [address]      Radiology Studies: No results found.  Scheduled Meds: . amLODipine  5 mg Oral Daily  . heparin  1,000 Units Intracatheter Once  . levothyroxine  50 mcg Oral QAC breakfast  . pyridostigmine  60 mg Oral QID   Continuous Infusions: . citrate dextrose    . sodium chloride Stopped (05/03/18 1849)     LOS: 6 days   Marylu Lund, MD [facility] Pager [phone]  If 7PM-7AM, please contact night-coverage [email or web address] Password [facility] 05/08/2018, 3:16 PM

## 2018-05-08 NOTE — Progress Notes (Signed)
Patient NIF and VC done with great efforts. NIf greater than -40 and VC 3.3L

## 2018-05-08 NOTE — Progress Notes (Signed)
Patient's niece called to speak with CSW regarding SNF choices. She states that patient is doing well and probably would like to go home at discharge, however she is in agreement with a few days at [facility]. She has selected [facility]. They have a bed this weekend and will begin insurance authorization.   Percell Locus Ania Levay LCSW [phone]

## 2018-05-08 NOTE — Progress Notes (Signed)
NIF -40 VC 2.2L  

## 2018-05-08 NOTE — Consult Note (Signed)
            [facility] CM Primary Care Navigator  05/08/2018  Jason Riley [birth year] [id number]   Went to seepatientat the bedside to identify possible discharge needs prior to him going to hemodialysis.  Patienthad worsening generalized weakness thatresulted to this admission. (possible myasthenia gravis flare; AKI- acute kidney injury)  Patient endorses JasonDonald Riley with [facility] care provider.   [facility] [place] [facility] to obtain medications without difficulty.  Patienthas beenmanaging hisown medications at homeusing "pill box" systemfilled once a week.  Patient reports that he has been driving prior to admission but his niece Jason Riley) and husband Jason Riley) can provide transportation to his doctors'appointments after discharge.  Patient states that he lives alone but his niece (in [place]) and husband are looking after him (they call and check on him daily).  Anticipated discharge plan is skilled nursing facility (SNF) for rehabilitation per therapy recommendation. He mentioned that he has plans for placement in the future if requiring more care. Encouraged to discuss with his primary care provider of plans for long term placement in the near future.   Patientvoiced understanding to call primary care provider's officewhenhewill returnhome,for a post discharge follow-upvisitwithin1- 2weeksor sooner if needed.Patient letter (with PCP's contact number) was provided as his reminder.  Explained to patient regarding [facility] CM services available for healthmanagementand resourcesat homebut he denies any current needs or concerns at this point.He verbalizedunderstandingof needto seekreferral from primary care provider to [facility] care management ifdeemed necessary and appropriatefor anyservicesin the nearfuture- once he returnsbackhome.   [facility] care  management information provided for future needs that he may have.   Primary care provider's office is listed as providing transition of care (TOC) follow-up.    For additional questions please contact:  Edwena Felty A. Haneefah Venturini, BSN, RN-BC [facility] PRIMARY CARE Navigator Cell: [phone]

## 2018-05-08 NOTE — Progress Notes (Signed)
Physical Therapy Treatment Patient Details Name: Jason Riley MRN: [id number] DOB: 1928/12/17 Today's Date: 05/08/2018    History of Present Illness 84 y.o. male with past medical history of seropositive myasthenia gravis diagnosed in January 2019, prostate cancer presents to [facility] emergency room for worsening symptoms of weakness, double vision and shortness of breath on exertion as well as worsening dysphagia.    PT Comments    Patient is progressing very well towards their physical therapy goals. Able to ambulate 550 feet without an assistive device today for balance training with mainly min guard assist; one loss of balance requiring min assist by PT to correct. Session focused on high level balance activities including side stepping, sudden starts/stops, head turns, and stepping over/around obstacles. Instructed patient that he will have increased independence and be at decreased risk of falls at home using assistive device, but needs reinforcement. Updated d/c plan to HHPT if patient has available supervision.   Follow Up Recommendations  Home health PT;Supervision for mobility/OOB     Equipment Recommendations  Rolling walker with 5" wheels    Recommendations for Other Services       Precautions / Restrictions Precautions Precautions: Fall Restrictions Weight Bearing Restrictions: No    Mobility  Bed Mobility Overal bed mobility: Independent                Transfers Overall transfer level: Modified independent Equipment used: None Transfers: Sit to/from Stand Sit to Stand: Modified independent (Device/Increase time)            Ambulation/Gait Ambulation/Gait assistance: Min guard;Min assist Gait Distance (Feet): 550 Feet Assistive device: None Gait Pattern/deviations: Step-through pattern;Narrow base of support     General Gait Details: Patient with one posterior/lateral loss of balance initially, requiring min assist by PT to correct. Demonstrated  improved steadiness with progression of ambulation in hallway without walker. Able to perform side stepping, head turns, stepping over/around obstacles with min guard assist.    Stairs             Wheelchair Mobility    Modified Rankin (Stroke Patients Only)       Balance Overall balance assessment: Needs assistance Sitting-balance support: No upper extremity supported;Feet supported Sitting balance-Leahy Scale: Good     Standing balance support: No upper extremity supported;During functional activity Standing balance-Leahy Scale: Fair               High level balance activites: Side stepping;Turns;Sudden stops;Head turns              Cognition Arousal/Alertness: Awake/alert Behavior During Therapy: WFL for tasks assessed/performed Overall Cognitive Status: Within Functional Limits for tasks assessed                                        Exercises      General Comments        Pertinent Vitals/Pain Pain Assessment: No/denies pain    Home Living                      Prior Function            PT Goals (current goals can now be found in the care plan section) Acute Rehab PT Goals Patient Stated Goal: "go for a walk" PT Goal Formulation: With patient Progress towards PT goals: Progressing toward goals    Frequency    Min 3X/week  PT Plan Discharge plan needs to be updated    Co-evaluation              AM-PAC PT "6 Clicks" Daily Activity  Outcome Measure  Difficulty turning over in bed (including adjusting bedclothes, sheets and blankets)?: None Difficulty moving from lying on back to sitting on the side of the bed? : None Difficulty sitting down on and standing up from a chair with arms (e.g., wheelchair, bedside commode, etc,.)?: None Help needed moving to and from a bed to chair (including a wheelchair)?: None Help needed walking in hospital room?: A Little Help needed climbing 3-5 steps with a  railing? : A Little 6 Click Score: 22    End of Session Equipment Utilized During Treatment: Gait belt Activity Tolerance: Patient tolerated treatment well Patient left: in bed;with call bell/phone within reach;with bed alarm set Nurse Communication: Mobility status PT Visit Diagnosis: Unsteadiness on feet (R26.81);Muscle weakness (generalized) (M62.81)     Time: 4388-8757 PT Time Calculation (min) (ACUTE ONLY): 31 min  Charges:  $Therapeutic Activity: 23-37 mins                    G Codes:      Ellamae Sia, PT, DPT Acute Rehabilitation Services  Pager: [phone]   Willy Eddy 05/08/2018, 4:16 PM

## 2018-05-09 ENCOUNTER — Encounter (HOSPITAL_COMMUNITY): Payer: Self-pay | Admitting: *Deleted

## 2018-05-09 DIAGNOSIS — N183 Chronic kidney disease, stage 3 (moderate): Secondary | ICD-10-CM

## 2018-05-09 LAB — BASIC METABOLIC PANEL
Anion gap: 7 (ref 5–15)
BUN: 21 mg/dL (ref 8–23)
CHLORIDE: 113 mmol/L — AB (ref 98–111)
CO2: 21 mmol/L — ABNORMAL LOW (ref 22–32)
Calcium: 8.9 mg/dL (ref 8.9–10.3)
Creatinine, Ser: 1.75 mg/dL — ABNORMAL HIGH (ref 0.61–1.24)
GFR calc Af Amer: 39 mL/min — ABNORMAL LOW (ref >60)
GFR calc non Af Amer: 33 mL/min — ABNORMAL LOW (ref >60)
GLUCOSE: 98 mg/dL (ref 70–99)
POTASSIUM: 4.5 mmol/L (ref 3.5–5.1)
SODIUM: 141 mmol/L (ref 135–145)

## 2018-05-09 NOTE — Progress Notes (Signed)
PROGRESS NOTE    Jason Riley  NWG:[id number] DOB: 31-Aug-1929 DOA: 05/02/2018 PCP: [name], MD    Brief Narrative:  84 y.o. male with medical history significant for recently diagnosed myasthenia gravis (followed by [facility]), prostate cancer s/p prostatectomy 2001, severe AS (seen on 2015 TTE), CKD3, HTN, hypothyroid, who lives alone at home, presenting with the above.  Symptoms have been worsening for a month or two. Generalized weakness. Once fell and really struggled to get back up. Having continued double vision. Also occasional choking. Lots of unsteadiness. No chest pain or SOB. No fevers. No melena. No focal symptoms. Spoke with his neurologist who advised presenting to [facility] for admission for possible myasthenia flare but patient opted to come here as much closer to his home. No new orthopnea or LE swelling. Thinks has been eating and drinking a bit less than normal 2/2 fatigue and trouble chewing and swallowing  Assessment & Plan:   Active Problems:   Aortic stenosis with mitral and aortic insufficiency   CKD (chronic kidney disease), stage III (HCC)   Coronary artery disease involving native heart   Hypertension   Hypothyroid   Myasthenia gravis ([place])   AKI (acute kidney injury) ([place])  Myasthenia gravis exacerbation -presented with increasing weakness, fatigue, seeing double and trouble swallowing. No sob. --Swallow eval recommended dysphagia 3 diet/thin liquid -Respiratory therapist to monitor NIF --Right IJ non-tunneled triple lumen HD catheter placed by IR for plasmapheresis per neurology recommendation -Continue pyridostigmine per neurology recommendation --Per [facility] urology notes , patient is recommended to start on CellCept , however he has not picked up this medication yet   Per inhouse neurology Dr Rory Percy: "-Complete 5 rounds of plasma exchange-today, Thursday and Saturday. -He can be discharged after thePLEXcatheterisremoved after his last plasma exchange  on Saturday. -Continue on Mestinon at current doses -Management of toxic metabolic derangements per primary team -Follow-up with outpatient neurology in the next 2 weeks and will defer the decision for CellCept resumption to the outpatient neurologist." -Tolerating pharesis. Plan to continue through 7/20   AKI on CKD 3 -bun 50/cr 3.57 on presentation, baseline cr around 1.8-1.9 -From dehydration, UA unremarkable /renal ultrasound no hydronephrosis  -he received hydration from admission to 7/15. -bun /cr now close to baseline --Home meds lisinopril HCTZ held , do not plan to resume due to ckd -renal dosing meds. Stable  HTN: -Home BP med held on presentation due to borderline bp and aki -Blood pressure start to trend up, will start norvasc for bp control - Not a good candidate for betablocker or ccb, he has tendency to have bradycardia -plan to discontinue home meds acei due to ckd -no a good candidate for imdur due to vasodilatation effect in the setting of aortic stenosis  aortic stenosis -Denies chest pain, syncope episode -per Chart review he declined aortic valve repair -Troponin on presentation mild and flat at 0.03 and 0.04.  EKG with lateral lead ST depression -Echocardiogram with normal wall motion, normal left ventricular EF.  He does has mild LVH, grade 1 diastolic dysfunction.  Severe aortic valve stenosis -Prevent dehydration as patient is preload dependent  Hypothyroidism -TSH 2.1, continue home meds  FTT:  PT recommended SNF at time of discharge.  DVT prophylaxis: SCD's Code Status: DNR Family Communication: Pt in room, family not at bedside Disposition Plan: Possible d/c after plasmapharesis on 7/20  Consultants:   Nephrology  Neurology  Procedures:     Antimicrobials: Anti-infectives (From admission, onward)   None  Subjective: Eager to go home  Objective: Vitals:   05/08/18 1316 05/08/18 2132 05/09/18 0512 05/09/18 1404  BP: (!)  101/52 125/63 128/62 132/63  Pulse: 70 67 65 65  Resp: 19 16 18 18   Temp: 98.5 F (36.9 C) 98.6 F (37 C) (!) 97.4 F (36.3 C) 98.5 F (36.9 C)  TempSrc: Oral Oral Oral Oral  SpO2: 100% 97% 99% 99%  Weight:   64.6 kg (142 lb 8 oz)   Height:        Intake/Output Summary (Last 24 hours) at 05/09/2018 1430 Last data filed at 05/08/2018 1718 Gross per 24 hour  Intake 1182.75 ml  Output -  Net 1182.75 ml   Filed Weights   05/07/18 0500 05/08/18 0622 05/09/18 0512  Weight: 69.8 kg (153 lb 14.1 oz) 63.9 kg (140 lb 12.8 oz) 64.6 kg (142 lb 8 oz)    Examination: General exam: Awake, laying in bed, in nad Respiratory system: Normal respiratory effort, no wheezing Cardiovascular system: regular rate, s1, s2  Data Reviewed: I have personally reviewed following labs and imaging studies  CBC: Recent Labs  Lab 05/02/18 1559 05/03/18 1801 05/05/18 1806 05/05/18 1918 05/08/18 0423 05/08/18 1025  WBC 5.8  --   --  7.5 11.0*  --   HGB 14.2 11.9* 11.6* 12.4* 10.8* 10.2*  HCT 43.6 35.0* 34.0* 37.1* 32.7* 30.0*  MCV 94.4  --   --  94.6 95.1  --   PLT 171  --   --  123* 120*  --    Basic Metabolic Panel: Recent Labs  Lab 05/05/18 0409  05/06/18 0236 05/07/18 0341 05/08/18 0423 05/08/18 1025 05/09/18 0412  NA 141   < > 143 141 142 143 141  K 3.9   < > 4.3 4.1 4.5 4.1 4.5  CL 116*   < > 115* 119* 114* 109 113*  CO2 20*  --  24 20* 22  --  21*  GLUCOSE 115*   < > 122* 106* 99 85 98  BUN 23   < > 17 19 19 19 21   CREATININE 2.03*   < > 1.97* 1.76* 1.80* 1.50* 1.75*  CALCIUM 8.6*  --  8.7* 8.4* 8.8*  --  8.9   < > = values in this interval not displayed.   GFR: Estimated Creatinine Clearance: 27.2 mL/min (A) (by C-G formula based on SCr of 1.75 mg/dL (H)). Liver Function Tests: Recent Labs  Lab 05/04/18 0451  AST 21  ALT 16  ALKPHOS 19*  BILITOT 0.7  PROT 4.8*  ALBUMIN 3.6   No results for input(s): LIPASE, AMYLASE in the last 168 hours. No results for input(s):  AMMONIA in the last 168 hours. Coagulation Profile: Recent Labs  Lab 05/03/18 0723  INR 1.15   Cardiac Enzymes: Recent Labs  Lab 05/02/18 1559 05/02/18 1857 05/03/18 0444  TROPONINI 0.03* 0.04* 0.04*   BNP (last 3 results) No results for input(s): PROBNP in the last 8760 hours. HbA1C: No results for input(s): HGBA1C in the last 72 hours. CBG: No results for input(s): GLUCAP in the last 168 hours. Lipid Profile: No results for input(s): CHOL, HDL, LDLCALC, TRIG, CHOLHDL, LDLDIRECT in the last 72 hours. Thyroid Function Tests: No results for input(s): TSH, T4TOTAL, FREET4, T3FREE, THYROIDAB in the last 72 hours. Anemia Panel: No results for input(s): VITAMINB12, FOLATE, FERRITIN, TIBC, IRON, RETICCTPCT in the last 72 hours. Sepsis Labs: No results for input(s): PROCALCITON, LATICACIDVEN in the last 168 hours.  Recent Results (from  the past 240 hour(s))  MRSA PCR Screening     Status: None   Collection Time: 05/02/18 11:17 PM  Result Value Ref Range Status   MRSA by PCR NEGATIVE NEGATIVE Final    Comment:        The GeneXpert MRSA Assay (FDA approved for NASAL specimens only), is one component of a comprehensive MRSA colonization surveillance program. It is not intended to diagnose MRSA infection nor to guide or monitor treatment for MRSA infections. Performed at [facility], [place] [address] [place], [place] [address]      Radiology Studies: No results found.  Scheduled Meds: . amLODipine  5 mg Oral Daily  . heparin  1,000 Units Intracatheter Once  . levothyroxine  50 mcg Oral QAC breakfast  . pyridostigmine  60 mg Oral QID   Continuous Infusions: . citrate dextrose    . sodium chloride Stopped (05/03/18 1849)     LOS: 7 days   Marylu Lund, MD [facility] Pager [phone]  If 7PM-7AM, please contact night-coverage [email or web address] Password [email or web address] 05/09/2018, 2:30 PM

## 2018-05-09 NOTE — Progress Notes (Signed)
Patient NIF greater than -40 and VC 2.9L

## 2018-05-09 NOTE — Progress Notes (Signed)
Pulmonary mechanics done-   NIF >-40 cmh2o  FVC 2.2 liters  Very good patient effort.

## 2018-05-10 LAB — BASIC METABOLIC PANEL
ANION GAP: 5 (ref 5–15)
Anion gap: 8 (ref 5–15)
BUN: 22 mg/dL (ref 8–23)
BUN: 22 mg/dL (ref 8–23)
CHLORIDE: 113 mmol/L — AB (ref 98–111)
CHLORIDE: 109 mmol/L (ref 98–111)
CO2: 22 mmol/L (ref 22–32)
CO2: 22 mmol/L (ref 22–32)
CREATININE: 1.81 mg/dL — AB (ref 0.61–1.24)
Calcium: 9.1 mg/dL (ref 8.9–10.3)
Calcium: 8.9 mg/dL (ref 8.9–10.3)
Creatinine, Ser: 1.74 mg/dL — ABNORMAL HIGH (ref 0.61–1.24)
GFR calc Af Amer: 37 mL/min — ABNORMAL LOW (ref >60)
GFR calc non Af Amer: 34 mL/min — ABNORMAL LOW (ref >60)
GFR calc non Af Amer: 32 mL/min — ABNORMAL LOW (ref >60)
GFR, EST AFRICAN AMERICAN: 39 mL/min — AB (ref >60)
GLUCOSE: 95 mg/dL (ref 70–99)
GLUCOSE: 115 mg/dL — AB (ref 70–99)
Potassium: 4 mmol/L (ref 3.5–5.1)
Potassium: 3.9 mmol/L (ref 3.5–5.1)
SODIUM: 139 mmol/L (ref 135–145)
Sodium: 140 mmol/L (ref 135–145)

## 2018-05-10 LAB — CBC WITH DIFFERENTIAL/PLATELET
Abs Immature Granulocytes: 0.1 10*3/uL (ref 0.0–0.1)
Basophils Absolute: 0.1 10*3/uL (ref 0.0–0.1)
Basophils Relative: 1 %
Eosinophils Absolute: 0.3 10*3/uL (ref 0.0–0.7)
Eosinophils Relative: 4 %
HEMATOCRIT: 31.7 % — AB (ref 39.0–52.0)
HEMOGLOBIN: 10.1 g/dL — AB (ref 13.0–17.0)
Immature Granulocytes: 1 %
LYMPHS ABS: 1.3 10*3/uL (ref 0.7–4.0)
LYMPHS PCT: 16 %
MCH: 30.7 pg (ref 26.0–34.0)
MCHC: 31.9 g/dL (ref 30.0–36.0)
MCV: 96.4 fL (ref 78.0–100.0)
MONOS PCT: 9 %
Monocytes Absolute: 0.7 10*3/uL (ref 0.1–1.0)
Neutro Abs: 5.6 10*3/uL (ref 1.7–7.7)
Neutrophils Relative %: 69 %
Platelets: 138 10*3/uL — ABNORMAL LOW (ref 150–400)
RBC: 3.29 MIL/uL — AB (ref 4.22–5.81)
RDW: 13.5 % (ref 11.5–15.5)
WBC: 8 10*3/uL (ref 4.0–10.5)

## 2018-05-10 MED ORDER — ACD FORMULA A 0.73-2.45-2.2 GM/100ML VI SOLN
500.0000 mL | Status: DC
  Administered 2018-05-10: 500 mL via INTRAVENOUS
  Filled 2018-05-10 (×2): qty 500

## 2018-05-10 MED ORDER — ACD FORMULA A 0.73-2.45-2.2 GM/100ML VI SOLN
Status: AC
  Administered 2018-05-10: 500 mL via INTRAVENOUS
  Filled 2018-05-10: qty 500

## 2018-05-10 MED ORDER — SODIUM CHLORIDE 0.9 % IV SOLN
INTRAVENOUS | Status: AC
  Administered 2018-05-10: 16:00:00 via INTRAVENOUS_CENTRAL
  Filled 2018-05-10 (×3): qty 200

## 2018-05-10 MED ORDER — CALCIUM CARBONATE ANTACID 500 MG PO CHEW
2.0000 | CHEWABLE_TABLET | ORAL | Status: AC
  Administered 2018-05-10: 400 mg via ORAL
  Filled 2018-05-10: qty 2

## 2018-05-10 MED ORDER — SODIUM CHLORIDE 0.9 % IV SOLN
2.0000 g | Freq: Once | INTRAVENOUS | Status: AC
  Administered 2018-05-10: 2 g via INTRAVENOUS
  Filled 2018-05-10 (×3): qty 20

## 2018-05-10 MED ORDER — HEPARIN SODIUM (PORCINE) 1000 UNIT/ML IJ SOLN
1000.0000 [IU] | Freq: Once | INTRAMUSCULAR | Status: DC
  Filled 2018-05-10: qty 1

## 2018-05-10 MED ORDER — AMLODIPINE BESYLATE 5 MG PO TABS: 5.0000 mg | ORAL_TABLET | Freq: Every day | ORAL | 0 refills | Status: DC

## 2018-05-10 MED ORDER — DIPHENHYDRAMINE HCL 25 MG PO CAPS: 25.0000 mg | ORAL_CAPSULE | Freq: Four times a day (QID) | ORAL | Status: DC | PRN

## 2018-05-10 MED ORDER — SODIUM CHLORIDE 0.9 % IV SOLN
INTRAVENOUS | Status: AC
  Administered 2018-05-10 (×2): via INTRAVENOUS_CENTRAL
  Filled 2018-05-10 (×2): qty 200

## 2018-05-10 MED ORDER — ACETAMINOPHEN 325 MG PO TABS: 650.0000 mg | ORAL_TABLET | ORAL | Status: DC | PRN

## 2018-05-10 MED ORDER — CALCIUM CARBONATE ANTACID 500 MG PO CHEW
CHEWABLE_TABLET | ORAL | Status: AC
  Administered 2018-05-10: 400 mg
  Filled 2018-05-10: qty 2

## 2018-05-10 NOTE — Progress Notes (Signed)
NIF > -40 VC 2.8L Good patient effort

## 2018-05-10 NOTE — Progress Notes (Signed)
NIF -30 VC 2L. Good pt effort

## 2018-05-10 NOTE — Discharge Summary (Addendum)
Physician Discharge Summary  Jason Riley FBP:[id number] DOB: [birth year] DOA: 05/02/2018  PCP: [name], MD  Admit date: 05/02/2018 Discharge date: 05/11/2018  Admitted From: Home Disposition:  Home  Recommendations for Outpatient Follow-up:  1. Follow up with PCP in 1-2 weeks 2. Recommend repeat bmet within one week  Home Health:PT, RN  Equipment/Devices:rolling walker with 5 in wheels    Discharge Condition:Improved CODE STATUS:DNR Diet recommendation: Dysphagia 3 with thin liquids   Brief/Interim Summary: 84 y.o.malewith medical history significant forrecently diagnosed myasthenia gravis (followed by [facility]), prostate cancer s/p prostatectomy 2001, severe AS (seen on 2015 TTE), CKD3, HTN, hypothyroid, who lives alone at home, presenting with the above.  Symptoms have been worsening for a month or two. Generalized weakness. Once fell and really struggled to get back up. Having continued double vision. Also occasional choking. Lots of unsteadiness. No chest pain or SOB. No fevers. No melena. No focal symptoms. Spoke with his neurologist who advised presenting to [facility] for admission for possible myasthenia flare but patient opted to come here as much closer to his home. No new orthopnea or LE swelling. Thinks has been eating and drinking a bit less than normal 2/2 fatigue and trouble chewing and swallowing  Myasthenia gravis exacerbation -presented with increasing weakness, fatigue, seeing double and trouble swallowing. No sob. --Swallow eval recommended dysphagia 3 diet/thin liquid -Respiratory therapist to monitor NIF --Right IJ non-tunneled triple lumen HD catheter placedby IRfor plasmapheresis per neurology recommendation -Continued on pyridostigmine per neurology recommendation --Per [facility] urology notes , patient is recommended to start on CellCept , however he has not picked up this medication yet  Per inhouse neurology Dr Rory Percy: "-Complete 5 rounds of plasma  exchange-today, Thursday and Saturday. -He can be discharged after thePLEXcatheterisremoved after his last plasma exchange on Saturday. -Continue on Mestinon at current doses -Management of toxic metabolic derangements per primary team -Follow-up with outpatient neurology in the next 2 weeks and will defer the decision for CellCept resumption to the outpatient neurologist." -Tolerating pharesis. Plan to continue through 7/20   AKI on CKD 3 -bun 50/cr 3.57 on presentation, baseline cr around 1.8-1.9 -From dehydration, UA unremarkable /renal ultrasound no hydronephrosis  -he received hydration from [place] 7/15. -bun/crnow close to baseline --Home meds lisinopril HCTZ held, do not plan to resume due to ckd -renal dosing meds. Stable  HTN: -Home BP med held on presentation due to borderline bp and aki -Blood pressure start to trend up, will start norvasc for bp control - Not a good candidate for betablocker or ccb, he has tendency to have bradycardia -plan to discontinue home meds acei due to ckd -no a good candidate for imdur due to vasodilatation effect in the setting of aortic stenosis  aortic stenosis -Denies chest pain, syncope episode -per Chart review he declined aortic valve repair -Troponin on presentation mild and flat at 0.03 and 0.04. EKG with lateral lead ST depression -Echocardiogram with normal wall motion, normal left ventricular EF. He does has mild LVH, grade 1 diastolic dysfunction. Severe aortic valve stenosis  Hypothyroidism -TSH 2.1, continue home meds  FTT:PT initially recommended SNF at time of discharge, however on re-evaluation, recommendation for home health with PT     Discharge Diagnoses:  Active Problems:   Aortic stenosis with mitral and aortic insufficiency   CKD (chronic kidney disease), stage III (HCC)   Coronary artery disease involving native heart   Hypertension   Hypothyroid   Myasthenia gravis ([place])   AKI (acute  kidney injury) (  [place])    Discharge Instructions   Allergies as of 05/11/2018      Reactions   Atorvastatin Other (See Comments)   Elevated liver functions ("liver disorder")   Sulfa Antibiotics Rash      Medication List    STOP taking these medications   lisinopril-hydrochlorothiazide 10-12.5 MG tablet Commonly known as:  PRINZIDE,ZESTORETIC     TAKE these medications   amLODipine 5 MG tablet Commonly known as:  NORVASC Take 1 tablet (5 mg total) by mouth daily.   ASPIRIN LOW DOSE 81 MG EC tablet Generic drug:  aspirin Take 81 mg by mouth daily.   levothyroxine 50 MCG tablet Commonly known as:  SYNTHROID, LEVOTHROID TAKE 1 TABLET BY MOUTH  DAILY   mycophenolate 250 MG capsule Commonly known as:  CELLCEPT Take 250 mg by mouth See admin instructions. Take 250 mg by mouth once a day for 1 week, then increase to 250 mg two times a day   pyridostigmine 60 MG tablet Commonly known as:  MESTINON Take 60 mg by mouth 4 (four) times daily.   rosuvastatin 20 MG tablet Commonly known as:  CRESTOR TAKE 1 TABLET BY MOUTH  DAILY   UNABLE TO FIND "Ice Breakers mints": Dissolve 1 mint in the mouth as needed for dry throat            Durable Medical Equipment  (From admission, onward)        Start     Ordered   05/10/18 1356  For home use only DME Walker rolling  Once    Question:  Patient needs a walker to treat with the following condition  Answer:  Myasthenia gravis ([place])   05/10/18 1356     Contact information for after-discharge care    Destination    [facility] .   Service:  Skilled Nursing Contact information: [place] [address] [phone]             Allergies  Allergen Reactions  . Atorvastatin Other (See Comments)    Elevated liver functions ("liver disorder")  . Sulfa Antibiotics Rash    Consultations:  Neurology  IR  Nephroloogy  Procedures/Studies: Dg Chest 2 View  Result  Date: 05/02/2018 CLINICAL DATA:  Shortness of breath EXAM: CHEST - 2 VIEW COMPARISON:  Chest CT April 16, 2018 FINDINGS: There is a calcified granuloma in the right mid lung. There is no evident edema or consolidation. The heart size and pulmonary vascularity are normal. No adenopathy. There is aortic atherosclerosis. There is calcification in the aortic valve region. There is degenerative change in the thoracic spine. IMPRESSION: Calcified granuloma right mid lung.  No edema or consolidation. There is aortic atherosclerosis. Heart size normal. Calcification in the aortic valve raises concern for potential degree of aortic stenosis. Aortic Atherosclerosis (ICD10-I70.0). Electronically Signed   By: Lowella Grip III M.D.   On: 05/02/2018 16:30   Ct Chest Wo Contrast  Result Date: 04/17/2018 CLINICAL DATA:  Weakness, chewing difficulty.C/o having "nerve disease", states Dr is concerned about his thyroid, c/o SHOB w/activity, fatigue after 15 min of activity, states he has to rest, catch his breath and then can do 15 min more of activity. Denies surgical hx to chest. Denies pain to chest. Never smoker. States he chews tobacco. EXAM: CT CHEST WITHOUT CONTRAST TECHNIQUE: Multidetector CT imaging of the chest was performed following the standard protocol without IV contrast. COMPARISON:  None. FINDINGS: Cardiovascular: Heart size normal. No pericardial effusion. Calcific  densities in the region of the interventricular septum. Scattered coronary calcifications. Very heavy aortic leaflet calcifications. No aortic aneurysm. Moderate calcified atheromatous plaque throughout the visualized thoracic and proximal abdominal aorta. Mediastinum/Nodes: No definite hilar or mediastinal adenopathy. Calcified right hilar lymph nodes. Lungs/Pleura: No pleural effusion. No pneumothorax. Calcified granuloma in the right lower lobe image 90/3. Scattered small nodular opacities peripherally in both lower lobes favor  inflammatory/infectious etiology. No confluent airspace disease. Upper Abdomen: Low-attenuation bilateral renal lesions, incompletely characterized. No acute findings. Scattered splenic calcified granulomas. Musculoskeletal: Anterior vertebral endplate spurring at multiple levels in the mid and lower thoracic spine. No fracture or worrisome bone lesion. IMPRESSION: 1. Heavy aortic leaflet calcifications. Correlate with any clinical or echocardiographic evidence of aortic stenosis. 2. Coronary and Aortic Atherosclerosis (ICD10-170.0) 3. Multiple small 1-3 mm nodular densities scattered in both lung bases, favor infectious/inflammatory etiology. No follow-up needed if patient is low-risk (and has no known or suspected primary neoplasm). Non-contrast chest CT can be considered in 12 months if patient is high-risk. This recommendation follows the consensus statement: Guidelines for Management of Incidental Pulmonary Nodules Detected on CT Images: From the Fleischner Society 2017; Radiology 2017; 284:228-243. 4. Old granulomatous disease. Electronically Signed   By: Lucrezia Europe M.D.   On: 04/17/2018 09:18   US Renal  Result Date: 05/03/2018 CLINICAL DATA:  Renal insufficiency. EXAM: RENAL / URINARY TRACT ULTRASOUND COMPLETE COMPARISON:  None. FINDINGS: Right Kidney: Length: 10.7 cm. The right renal cortex demonstrates increased cortical echogenicity and atrophy. No hydronephrosis. Simple cyst of the upper pole measures 1.9 cm in greatest diameter. Simple cyst of the mid cortex measures 1.5 cm in greatest diameter. Left Kidney: Length: 10.3 cm. The left renal cortex demonstrates atrophy and increased echogenicity. No hydronephrosis. Simple cyst of the upper pole measures 1.5 cm in greatest diameter. Bladder: Appears normal for degree of bladder distention. IMPRESSION: Equal sized kidneys with both demonstrating cortical atrophy and increased cortical echogenicity. No evidence of hydronephrosis. Both kidneys demonstrate  simple cysts. Electronically Signed   By: Aletta Edouard M.D.   On: 05/03/2018 10:04   Ir Fluoro Guide Cv Line Left  Result Date: 05/03/2018 INDICATION: 84 year old male with myasthenia gravis in need of central venous access for plasmapheresis. EXAM: IR ULTRASOUND GUIDANCE VASC ACCESS RIGHT; IR LEFT FLOURO GUIDE CV LINE MEDICATIONS: None ANESTHESIA/SEDATION: None FLUOROSCOPY TIME:  Fluoroscopy Time: 0 minutes 9 seconds (9 mGy). COMPLICATIONS: None immediate. PROCEDURE: Informed written consent was obtained from the patient after a thorough discussion of the procedural risks, benefits and alternatives. All questions were addressed. Maximal Sterile Barrier Technique was utilized including caps, mask, sterile gowns, sterile gloves, sterile drape, hand hygiene and skin antiseptic. A timeout was performed prior to the initiation of the procedure. The right internal jugular vein was interrogated with ultrasound and found to be widely patent. An image was obtained and stored for the medical record. Local anesthesia was attained by infiltration with 1% lidocaine. A small dermatotomy was made. Under real-time sonographic guidance, the vessel was punctured with a 21 gauge micropuncture needle. Using standard technique, the initial micro needle was exchanged over a 0.018 micro wire for a transitional 4 [place] micro sheath. The micro sheath was then exchanged over a 0.035 wire for a soft tissue dilator and the skin tract was dilated. A 20 cm triple-lumen non tunneled hemodialysis catheter was then advanced over the wire and positioned with the catheter tips in the mid right atrium. A fluoroscopic saved image was obtained and saved for the medical  record. The catheter flushes and aspirates easily. The catheter was flushed with heparinized saline and secured to the skin with 0 Prolene suture. A sterile bandage was applied. IMPRESSION: Right IJ non tunneled triple-lumen hemodialysis catheter. Catheter tips are in the mid  right atrium and ready for immediate use. Signed, Criselda Peaches, MD [facility] Radiology Electronically Signed   By: Jacqulynn Cadet M.D.   On: 05/03/2018 15:13   Ir US Guide Vasc Access Right  Result Date: 05/03/2018 INDICATION: 84 year old male with myasthenia gravis in need of central venous access for plasmapheresis. EXAM: IR ULTRASOUND GUIDANCE VASC ACCESS RIGHT; IR LEFT FLOURO GUIDE CV LINE MEDICATIONS: None ANESTHESIA/SEDATION: None FLUOROSCOPY TIME:  Fluoroscopy Time: 0 minutes 9 seconds (9 mGy). COMPLICATIONS: None immediate. PROCEDURE: Informed written consent was obtained from the patient after a thorough discussion of the procedural risks, benefits and alternatives. All questions were addressed. Maximal Sterile Barrier Technique was utilized including caps, mask, sterile gowns, sterile gloves, sterile drape, hand hygiene and skin antiseptic. A timeout was performed prior to the initiation of the procedure. The right internal jugular vein was interrogated with ultrasound and found to be widely patent. An image was obtained and stored for the medical record. Local anesthesia was attained by infiltration with 1% lidocaine. A small dermatotomy was made. Under real-time sonographic guidance, the vessel was punctured with a 21 gauge micropuncture needle. Using standard technique, the initial micro needle was exchanged over a 0.018 micro wire for a transitional 4 [place] micro sheath. The micro sheath was then exchanged over a 0.035 wire for a soft tissue dilator and the skin tract was dilated. A 20 cm triple-lumen non tunneled hemodialysis catheter was then advanced over the wire and positioned with the catheter tips in the mid right atrium. A fluoroscopic saved image was obtained and saved for the medical record. The catheter flushes and aspirates easily. The catheter was flushed with heparinized saline and secured to the skin with 0 Prolene suture.  A sterile bandage was applied. IMPRESSION: Right IJ non tunneled triple-lumen hemodialysis catheter. Catheter tips are in the mid right atrium and ready for immediate use. Signed, Criselda Peaches, MD [facility] Radiology Electronically Signed   By: Jacqulynn Cadet M.D.   On: 05/03/2018 15:13    Subjective: Eager to go home  Discharge Exam: Vitals:   05/10/18 2045 05/11/18 0507  BP: (!) 136/45 118/61  Pulse: (!) 56 65  Resp: 20 16  Temp: 97.7 F (36.5 C) 98.1 F (36.7 C)  SpO2: 100% 98%   Vitals:   05/10/18 1745 05/10/18 1836 05/10/18 2045 05/11/18 0507  BP: 122/62 (!) 129/52 (!) 136/45 118/61  Pulse: 71 61 (!) 56 65  Resp: 16 16 20 16   Temp: 98.2 F (36.8 C) 98.5 F (36.9 C) 97.7 F (36.5 C) 98.1 F (36.7 C)  TempSrc: Oral Oral Oral Oral  SpO2: 100% 99% 100% 98%  Weight:    65.7 kg (144 lb 13.5 oz)  Height:        General: Pt is alert, awake, not in acute distress Cardiovascular: RRR, S1/S2 +, no rubs, no gallops Respiratory: CTA bilaterally, no wheezing, no rhonchi Abdominal: Soft, NT, ND, bowel sounds + Extremities: no edema, no cyanosis   The results of significant diagnostics from this hospitalization (including imaging, microbiology, ancillary and laboratory) are listed below for reference.     Microbiology: Recent Results (from the past 240 hour(s))  MRSA PCR Screening  Status: None   Collection Time: 05/02/18 11:17 PM  Result Value Ref Range Status   MRSA by PCR NEGATIVE NEGATIVE Final    Comment:        The GeneXpert MRSA Assay (FDA approved for NASAL specimens only), is one component of a comprehensive MRSA colonization surveillance program. It is not intended to diagnose MRSA infection nor to guide or monitor treatment for MRSA infections. Performed at [facility], [place] [address] [place], [place] [address]      Labs: BNP (last 3 results) Recent Labs    05/03/18 0444  BNP  283.1*   Basic Metabolic Panel: Recent Labs  Lab 05/07/18 0341 05/08/18 0423 05/08/18 1025 05/09/18 0412 05/10/18 0443 05/10/18 1754  NA 141 142 143 141 140 139  K 4.1 4.5 4.1 4.5 4.0 3.9  CL 119* 114* 109 113* 113* 109  CO2 20* 22  --  21* 22 22  GLUCOSE 106* 99 85 98 95 115*  BUN 19 19 19 21 22 22   CREATININE 1.76* 1.80* 1.50* 1.75* 1.74* 1.81*  CALCIUM 8.4* 8.8*  --  8.9 9.1 8.9   Liver Function Tests: No results for input(s): AST, ALT, ALKPHOS, BILITOT, PROT, ALBUMIN in the last 168 hours. No results for input(s): LIPASE, AMYLASE in the last 168 hours. No results for input(s): AMMONIA in the last 168 hours. CBC: Recent Labs  Lab 05/05/18 1806 05/05/18 1918 05/08/18 0423 05/08/18 1025 05/10/18 1754  WBC  --  7.5 11.0*  --  8.0  NEUTROABS  --   --   --   --  5.6  HGB 11.6* 12.4* 10.8* 10.2* 10.1*  HCT 34.0* 37.1* 32.7* 30.0* 31.7*  MCV  --  94.6 95.1  --  96.4  PLT  --  123* 120*  --  138*   Cardiac Enzymes: No results for input(s): CKTOTAL, CKMB, CKMBINDEX, TROPONINI in the last 168 hours. BNP: Invalid input(s): POCBNP CBG: No results for input(s): GLUCAP in the last 168 hours. D-Dimer No results for input(s): DDIMER in the last 72 hours. Hgb A1c No results for input(s): HGBA1C in the last 72 hours. Lipid Profile No results for input(s): CHOL, HDL, LDLCALC, TRIG, CHOLHDL, LDLDIRECT in the last 72 hours. Thyroid function studies No results for input(s): TSH, T4TOTAL, T3FREE, THYROIDAB in the last 72 hours.  Invalid input(s): FREET3 Anemia work up No results for input(s): VITAMINB12, FOLATE, FERRITIN, TIBC, IRON, RETICCTPCT in the last 72 hours. Urinalysis    Component Value Date/Time   COLORURINE YELLOW 05/02/2018 1750   APPEARANCEUR HAZY (A) 05/02/2018 1750   LABSPEC 1.018 05/02/2018 1750   PHURINE 5.0 05/02/2018 1750   GLUCOSEU NEGATIVE 05/02/2018 1750   HGBUR NEGATIVE 05/02/2018 1750   BILIRUBINUR NEGATIVE 05/02/2018 1750   KETONESUR NEGATIVE  05/02/2018 1750   PROTEINUR NEGATIVE 05/02/2018 1750   NITRITE NEGATIVE 05/02/2018 1750   LEUKOCYTESUR NEGATIVE 05/02/2018 1750   Sepsis Labs Invalid input(s): PROCALCITONIN,  WBC,  LACTICIDVEN Microbiology Recent Results (from the past 240 hour(s))  MRSA PCR Screening     Status: None   Collection Time: 05/02/18 11:17 PM  Result Value Ref Range Status   MRSA by PCR NEGATIVE NEGATIVE Final    Comment:        The GeneXpert MRSA Assay (FDA approved for NASAL specimens only), is one component of a comprehensive MRSA colonization surveillance program. It is not intended to diagnose MRSA infection nor to guide or monitor treatment for MRSA infections. Performed at [facility]  Lab, [address], [place], [place] [address]    Time spent: 30 min  SIGNED:   Marylu Lund, MD  [facility] 05/11/2018, 9:43 AM  If 7PM-7AM, please contact night-coverage [email or web address] Password [email or web address]

## 2018-05-11 NOTE — Progress Notes (Signed)
PROGRESS NOTE    Jason Riley  PTW:[id number] DOB: 12/07/1928 DOA: 05/02/2018 PCP: [name], MD    Brief Narrative:  84 y.o. male with medical history significant for recently diagnosed myasthenia gravis (followed by [facility]), prostate cancer s/p prostatectomy 2001, severe AS (seen on 2015 TTE), CKD3, HTN, hypothyroid, who lives alone at home, presenting with the above.  Symptoms have been worsening for a month or two. Generalized weakness. Once fell and really struggled to get back up. Having continued double vision. Also occasional choking. Lots of unsteadiness. No chest pain or SOB. No fevers. No melena. No focal symptoms. Spoke with his neurologist who advised presenting to [facility] for admission for possible myasthenia flare but patient opted to come here as much closer to his home. No new orthopnea or LE swelling. Thinks has been eating and drinking a bit less than normal 2/2 fatigue and trouble chewing and swallowing  Assessment & Plan:   Active Problems:   Aortic stenosis with mitral and aortic insufficiency   CKD (chronic kidney disease), stage III (HCC)   Coronary artery disease involving native heart   Hypertension   Hypothyroid   Myasthenia gravis ([place])   AKI (acute kidney injury) ([place])  Myasthenia gravis exacerbation -presented with increasing weakness, fatigue, seeing double and trouble swallowing. No sob. --Swallow eval recommended dysphagia 3 diet/thin liquid -Respiratory therapist to monitor NIF --Right IJ non-tunneled triple lumen HD catheter placedby IRfor plasmapheresis per neurology recommendation -Continued on pyridostigmine per neurology recommendation --Per [facility] urology notes , patient is recommended to start on CellCept , however he has not picked up this medication yet  Per inhouse neurology Dr Rory Percy: "-Complete 5 rounds of plasma exchange-today, Thursday and Saturday. -He can be discharged after thePLEXcatheterisremoved after his last plasma  exchange on Saturday. -Continue on Mestinon at current doses -Management of toxic metabolic derangements per primary team -Follow-up with outpatient neurology in the next 2 weeks and will defer the decision for CellCept resumption to the outpatient neurologist." -Tolerating pharesis. Continued through 7/20   AKI on CKD 3 -bun 50/cr 3.57 on presentation, baseline cr around 1.8-1.9 -From dehydration, UA unremarkable /renal ultrasound no hydronephrosis  -he received hydration from [place] 7/15. -bun/crnow close to baseline --Home meds lisinopril HCTZ held, do not plan to resume due to ckd -renal dosing meds. Stable  HTN: -Home BP med held on presentation due to borderline bp and aki -Blood pressure start to trend up, will start norvasc for bp control - Not a good candidate for betablocker or ccb, he has tendency to have bradycardia -plan to discontinue home meds acei due to ckd -no a good candidate for imdur due to vasodilatation effect in the setting of aortic stenosis  aortic stenosis -Denies chest pain, syncope episode -per Chart review he declined aortic valve repair -Troponin on presentation mild and flat at 0.03 and 0.04. EKG with lateral lead ST depression -Echocardiogram with normal wall motion, normal left ventricular EF. He does has mild LVH, grade 1 diastolic dysfunction. Severe aortic valve stenosis  Hypothyroidism -TSH 2.1, continue home meds  FTT:PT initially recommended SNF at time of discharge, however on re-evaluation, recommendation for home health with PT  DVT prophylaxis: SCD's Code Status: DNR Family Communication: Pt in room, family not at bedside Disposition Plan: Possible d/c after plasmapharesis on 7/20  Consultants:   Nephrology  Neurology  Procedures:     Antimicrobials: Anti-infectives (From admission, onward)   None      Subjective: Eager to go home today  Objective: Vitals:   05/10/18 1745 05/10/18 1836  05/10/18 2045 05/11/18 0507  BP: 122/62 (!) 129/52 (!) 136/45 118/61  Pulse: 71 61 (!) 56 65  Resp: 16 16 20 16   Temp: 98.2 F (36.8 C) 98.5 F (36.9 C) 97.7 F (36.5 C) 98.1 F (36.7 C)  TempSrc: Oral Oral Oral Oral  SpO2: 100% 99% 100% 98%  Weight:    65.7 kg (144 lb 13.5 oz)  Height:        Intake/Output Summary (Last 24 hours) at 05/11/2018 0941 Last data filed at 05/11/2018 0629 Gross per 24 hour  Intake 860 ml  Output 400 ml  Net 460 ml   Filed Weights   05/09/18 0512 05/10/18 0608 05/11/18 0507  Weight: 64.6 kg (142 lb 8 oz) 63.4 kg (139 lb 11.2 oz) 65.7 kg (144 lb 13.5 oz)    Examination: General exam: Conversant, in no acute distress Respiratory system: normal chest rise, clear, no audible wheezing  Data Reviewed: I have personally reviewed following labs and imaging studies  CBC: Recent Labs  Lab 05/05/18 1806 05/05/18 1918 05/08/18 0423 05/08/18 1025 05/10/18 1754  WBC  --  7.5 11.0*  --  8.0  NEUTROABS  --   --   --   --  5.6  HGB 11.6* 12.4* 10.8* 10.2* 10.1*  HCT 34.0* 37.1* 32.7* 30.0* 31.7*  MCV  --  94.6 95.1  --  96.4  PLT  --  123* 120*  --  027*   Basic Metabolic Panel: Recent Labs  Lab 05/07/18 0341 05/08/18 0423 05/08/18 1025 05/09/18 0412 05/10/18 0443 05/10/18 1754  NA 141 142 143 141 140 139  K 4.1 4.5 4.1 4.5 4.0 3.9  CL 119* 114* 109 113* 113* 109  CO2 20* 22  --  21* 22 22  GLUCOSE 106* 99 85 98 95 115*  BUN 19 19 19 21 22 22   CREATININE 1.76* 1.80* 1.50* 1.75* 1.74* 1.81*  CALCIUM 8.4* 8.8*  --  8.9 9.1 8.9   GFR: Estimated Creatinine Clearance: 26.7 mL/min (A) (by C-G formula based on SCr of 1.81 mg/dL (H)). Liver Function Tests: No results for input(s): AST, ALT, ALKPHOS, BILITOT, PROT, ALBUMIN in the last 168 hours. No results for input(s): LIPASE, AMYLASE in the last 168 hours. No results for input(s): AMMONIA in the last 168 hours. Coagulation Profile: No results for input(s): INR, PROTIME in the last 168  hours. Cardiac Enzymes: No results for input(s): CKTOTAL, CKMB, CKMBINDEX, TROPONINI in the last 168 hours. BNP (last 3 results) No results for input(s): PROBNP in the last 8760 hours. HbA1C: No results for input(s): HGBA1C in the last 72 hours. CBG: No results for input(s): GLUCAP in the last 168 hours. Lipid Profile: No results for input(s): CHOL, HDL, LDLCALC, TRIG, CHOLHDL, LDLDIRECT in the last 72 hours. Thyroid Function Tests: No results for input(s): TSH, T4TOTAL, FREET4, T3FREE, THYROIDAB in the last 72 hours. Anemia Panel: No results for input(s): VITAMINB12, FOLATE, FERRITIN, TIBC, IRON, RETICCTPCT in the last 72 hours. Sepsis Labs: No results for input(s): PROCALCITON, LATICACIDVEN in the last 168 hours.  Recent Results (from the past 240 hour(s))  MRSA PCR Screening     Status: None   Collection Time: 05/02/18 11:17 PM  Result Value Ref Range Status   MRSA by PCR NEGATIVE NEGATIVE Final    Comment:        The GeneXpert MRSA Assay (FDA approved for NASAL specimens only), is one component of a comprehensive MRSA colonization  surveillance program. It is not intended to diagnose MRSA infection nor to guide or monitor treatment for MRSA infections. Performed at [facility], [place] [address], [place], [place] [address]      Radiology Studies: No results found.  Scheduled Meds: . amLODipine  5 mg Oral Daily  . heparin  1,000 Units Intracatheter Once  . heparin  1,000 Units Intracatheter Once  . levothyroxine  50 mcg Oral QAC breakfast  . pyridostigmine  60 mg Oral QID   Continuous Infusions: . citrate dextrose    . citrate dextrose    . sodium chloride Stopped (05/03/18 1849)     LOS: 9 days   Marylu Lund, MD [facility] Pager [phone]  If 7PM-7AM, please contact night-coverage [email or web address] Password [facility] 05/11/2018, 9:41 AM

## 2018-05-11 NOTE — Progress Notes (Signed)
NIF -40  VC 1.4L  Good effort

## 2018-05-11 NOTE — Progress Notes (Addendum)
Per PT, pt is progressing very well towards his PT goals. D/C plan is for pt to return home. PT is recommending HHPT, RN and a RW.   Met with pt at beside. Discussed D/C plan. He wants to return home. He reports that he lives alone. His wife died in 09-Oct-2023 and his son died in April 09, 2023. He reports that he has a niece that calls him every day and she and her husband can assist him if prn.   Discussed HH and DME. He agreed with [facility]. He doesn't have a preference for a [place] agency. He agreed to use [facility]. Contacted Brad and Reggie at [facility] for referrals.

## 2018-05-11 NOTE — Plan of Care (Signed)
Nsg Discharge Note  Admit Date:  05/02/2018 Discharge date: 05/11/2018   [place] to be D/C'd Home per MD order.  AVS completed.  Copy for chart, and copy for patient signed, and dated. Patient/caregiver able to verbalize understanding.  Discharge Medication: Allergies as of 05/11/2018       Reactions   Atorvastatin Other (See Comments)   Elevated liver functions ("liver disorder")   Sulfa Antibiotics Rash        Medication List     STOP taking these medications    lisinopril-hydrochlorothiazide 10-12.5 MG tablet Commonly known as:  PRINZIDE,ZESTORETIC       TAKE these medications    amLODipine 5 MG tablet Commonly known as:  NORVASC Take 1 tablet (5 mg total) by mouth daily.   ASPIRIN LOW DOSE 81 MG EC tablet Generic drug:  aspirin Take 81 mg by mouth daily.   levothyroxine 50 MCG tablet Commonly known as:  SYNTHROID, LEVOTHROID TAKE 1 TABLET BY MOUTH  DAILY   mycophenolate 250 MG capsule Commonly known as:  CELLCEPT Take 250 mg by mouth See admin instructions. Take 250 mg by mouth once a day for 1 week, then increase to 250 mg two times a day   pyridostigmine 60 MG tablet Commonly known as:  MESTINON Take 60 mg by mouth 4 (four) times daily.   rosuvastatin 20 MG tablet Commonly known as:  CRESTOR TAKE 1 TABLET BY MOUTH  DAILY   UNABLE TO FIND "Ice Breakers mints": Dissolve 1 mint in the mouth as needed for dry throat               Durable Medical Equipment  (From admission, onward)          Start     Ordered   05/10/18 1356  For home use only DME Walker rolling  Once    Question:  Patient needs a walker to treat with the following condition  Answer:  Myasthenia gravis ([place])   05/10/18 1356       Discharge Assessment: Vitals:   05/10/18 2045 05/11/18 0507  BP: (!) 136/45 118/61  Pulse: (!) 56 65  Resp: 20 16  Temp: 97.7 F (36.5 C) 98.1 F (36.7 C)  SpO2: 100% 98%   Skin clean, dry and intact without evidence of skin break  down, no evidence of skin tears noted. IV catheter discontinued intact. Site without signs and symptoms of complications - no redness or edema noted at insertion site, patient denies c/o pain - only slight tenderness at site.  Dressing with slight pressure applied.  D/c Instructions-Education: Discharge instructions given to patient/family with verbalized understanding. D/c education completed with patient/family including follow up instructions, medication list, d/c activities limitations if indicated, with other d/c instructions as indicated by MD - patient able to verbalize understanding, all questions fully answered. Patient instructed to return to ED, call 911, or call MD for any changes in condition.  Patient escorted via [place], and D/C home via private auto.  Salley Slaughter, RN 05/11/2018 1:14 PM

## 2018-05-12 LAB — POCT I-STAT, CHEM 8
BUN: 25 mg/dL — ABNORMAL HIGH (ref 8–23)
CALCIUM ION: 1.38 mmol/L (ref 1.15–1.40)
CHLORIDE: 107 mmol/L (ref 98–111)
Creatinine, Ser: 1.5 mg/dL — ABNORMAL HIGH (ref 0.61–1.24)
Glucose, Bld: 109 mg/dL — ABNORMAL HIGH (ref 70–99)
HEMATOCRIT: 29 % — AB (ref 39.0–52.0)
Hemoglobin: 9.9 g/dL — ABNORMAL LOW (ref 13.0–17.0)
Potassium: 4 mmol/L (ref 3.5–5.1)
SODIUM: 140 mmol/L (ref 135–145)
TCO2: 23 mmol/L (ref 22–32)

## 2018-05-19 DIAGNOSIS — H903 Sensorineural hearing loss, bilateral: Secondary | ICD-10-CM | POA: Diagnosis not present

## 2018-05-19 DIAGNOSIS — H6123 Impacted cerumen, bilateral: Secondary | ICD-10-CM | POA: Diagnosis not present

## 2018-05-26 NOTE — Progress Notes (Deleted)
Patient: Jason Riley Male    DOB: May 15, 1929   84 y.o.   MRN: [id number] Visit Date: 05/26/2018  Today's Provider: Lelon Huh, MD   No chief complaint on file.  Subjective:    HPI   Hypertension, follow-up:  BP Readings from Last 3 Encounters:  05/11/18 118/61  11/25/17 130/68  11/12/17 (!) 114/50    He was last seen for hypertension 6 months ago.  BP at that visit was 130/68. Management since that visit includes; no changes.He reports {excellent/good/fair/poor:19665} compliance with treatment. He {ACTION; IS/IS ZWC:[id number]} having side effects. *** He {is/is not:9024} exercising. He {is/is not:9024} adherent to low salt diet.   Outside blood pressures are ***. He is experiencing {Symptoms; cardiac:12860}.  Patient denies {Symptoms; cardiac:12860}.   Cardiovascular risk factors include {cv risk factors:510}.  Use of agents associated with hypertension: {bp agents assoc with hypertension:511::"none"}.   ------------------------------------------------------------------------  Coronary artery disease involving native coronary artery of native heart without angina pectoris From 11/25/2017-no changes. Followed by Dr. Clayborn Bigness.   CKD (chronic kidney disease), stage III ([place]) From 11/25/2017-labs checked, no changes.    Follow up Hospitalization  Patient was admitted to [facility] on 05/02/2018 and discharged on 05/11/2018. He was treated for; acute kidney injury. Creatinine elevation and Myasthenia Gravis .Marland Kitchen Treatment for this included; per discharge summery patient was to follow up with pcp ib 1-2 weeks and repeat BMET within 1 week. Telephone follow up was done on *** He reports {DESC; EXCELLENT/GOOD/FAIR:19992} compliance with treatment. He reports this condition is {improved/worse/unchanged:3041574}.  ------------------------------------------------------------------------------------      Allergies  Allergen Reactions  . Atorvastatin Other (See  Comments)    Elevated liver functions ("liver disorder")  . Sulfa Antibiotics Rash     Current Outpatient Medications:  .  amLODipine (NORVASC) 5 MG tablet, Take 1 tablet (5 mg total) by mouth daily., Disp: 30 tablet, Rfl: 0 .  aspirin (ASPIRIN LOW DOSE) 81 MG EC tablet, Take 81 mg by mouth daily. , Disp: , Rfl:  .  levothyroxine (SYNTHROID, LEVOTHROID) 50 MCG tablet, TAKE 1 TABLET BY MOUTH  DAILY, Disp: 90 tablet, Rfl: 3 .  mycophenolate (CELLCEPT) 250 MG capsule, Take 250 mg by mouth See admin instructions. Take 250 mg by mouth once a day for 1 week, then increase to 250 mg two times a day, Disp: , Rfl:  .  pyridostigmine (MESTINON) 60 MG tablet, Take 60 mg by mouth 4 (four) times daily., Disp: , Rfl:  .  rosuvastatin (CRESTOR) 20 MG tablet, TAKE 1 TABLET BY MOUTH  DAILY, Disp: 90 tablet, Rfl: 3 .  UNABLE TO FIND, "Ice Breakers mints": Dissolve 1 mint in the mouth as needed for dry throat, Disp: , Rfl:   Review of Systems  Constitutional: Negative for appetite change, chills and fever.  Respiratory: Negative for chest tightness, shortness of breath and wheezing.   Cardiovascular: Negative for chest pain and palpitations.  Gastrointestinal: Negative for abdominal pain, nausea and vomiting.    Social History   Tobacco Use  . Smoking status: Former Smoker    Years: 15.00    Types: Cigars  . Smokeless tobacco: Current User    Types: Chew  . Tobacco comment: quit over 30 years ago  Substance Use Topics  . Alcohol use: No    Alcohol/week: 0.0 oz   Objective:   There were no vitals taken for this visit. There were no vitals filed for this visit.   Physical Exam  Assessment & Plan:           Lelon Huh, MD  [facility]

## 2018-05-27 ENCOUNTER — Ambulatory Visit: Payer: Self-pay | Admitting: Family Medicine

## 2018-05-30 ENCOUNTER — Encounter: Payer: Self-pay | Admitting: Family Medicine

## 2018-05-30 ENCOUNTER — Ambulatory Visit (INDEPENDENT_AMBULATORY_CARE_PROVIDER_SITE_OTHER): Payer: Medicare Other | Admitting: Family Medicine

## 2018-05-30 VITALS — BP 144/69 | HR 67 | Temp 97.8°F | Resp 16 | Ht 68.0 in | Wt 144.0 lb

## 2018-05-30 DIAGNOSIS — I10 Essential (primary) hypertension: Secondary | ICD-10-CM | POA: Diagnosis not present

## 2018-05-30 DIAGNOSIS — N183 Chronic kidney disease, stage 3 unspecified: Secondary | ICD-10-CM

## 2018-05-30 MED ORDER — AMLODIPINE BESYLATE 5 MG PO TABS: 5.0000 mg | ORAL_TABLET | Freq: Every day | ORAL | 2 refills | Status: DC

## 2018-05-30 NOTE — Progress Notes (Signed)
Patient: Jason Riley Male    DOB: [birth year]   84 y.o.   MRN: [id number] Visit Date: 05/30/2018  Today's Provider: Lelon Huh, MD   Chief Complaint  Patient presents with  . Follow-up  . Hypertension  . Chronic Kidney Disease   Subjective:    HPI   Hypertension, follow-up:  BP Readings from Last 3 Encounters:  05/30/18 (!) 144/69  05/11/18 118/61  11/25/17 130/68    He was last seen for hypertension 6 months ago.  BP at that visit was 130/68. Management since that visit includes; labs checked, no changes.He reports good compliance with treatment. He is not having side effects. none He is exercising. He is not adherent to low salt diet.   Outside blood pressures are not checking. He is experiencing none.  Patient denies none.   Cardiovascular risk factors include advanced age (older than 71 for men, 54 for women).  Use of agents associated with hypertension: none.   ---------------------------------------------------------  CKD (chronic kidney disease), stage III ([place]) From 11/25/2017-labs checked, no changes.    Follow up Hospitalization  Patient was admitted to [facility] on 05/02/2018 and discharged on 05/11/2018. He was treated for AKI. He was admitted with cr = 3.57 and discharged with cr of 1.81 Treatment for this included; lisinopril-hctz was discontinued. Started amlodipine 5 mg. Telephone follow up was done on  He reports good compliance with treatment. He reports this condition is Improved.  ---------------------------------------------------------------   Patient states he feels somewhat better since discharge from hospital. Patient states he does still have some weakness in his right arm.    Allergies  Allergen Reactions  . Atorvastatin Other (See Comments)    Elevated liver functions ("liver disorder")  . Sulfa Antibiotics Rash     Current Outpatient Medications:  .  amLODipine (NORVASC) 5 MG tablet, Take 1 tablet (5 mg total) by  mouth daily., Disp: 30 tablet, Rfl: 0 .  aspirin (ASPIRIN LOW DOSE) 81 MG EC tablet, Take 81 mg by mouth daily. , Disp: , Rfl:  .  levothyroxine (SYNTHROID, LEVOTHROID) 50 MCG tablet, TAKE 1 TABLET BY MOUTH  DAILY, Disp: 90 tablet, Rfl: 3 .  mycophenolate (CELLCEPT) 250 MG capsule, Take 250 mg by mouth See admin instructions. Take 250 mg by mouth once a day for 1 week, then increase to 250 mg two times a day, Disp: , Rfl:  .  pyridostigmine (MESTINON) 60 MG tablet, Take 60 mg by mouth 4 (four) times daily., Disp: , Rfl:  .  rosuvastatin (CRESTOR) 20 MG tablet, TAKE 1 TABLET BY MOUTH  DAILY, Disp: 90 tablet, Rfl: 3 .  UNABLE TO FIND, "Ice Breakers mints": Dissolve 1 mint in the mouth as needed for dry throat, Disp: , Rfl:   Review of Systems  Constitutional: Negative for appetite change, chills and fever.  Respiratory: Negative for chest tightness, shortness of breath and wheezing.   Cardiovascular: Negative for chest pain and palpitations.  Gastrointestinal: Negative for abdominal pain, nausea and vomiting.    Social History   Tobacco Use  . Smoking status: Former Smoker    Years: 15.00    Types: Cigars  . Smokeless tobacco: Current User    Types: Chew  . Tobacco comment: quit over 30 years ago  Substance Use Topics  . Alcohol use: No    Alcohol/week: 0.0 standard drinks   Objective:   BP (!) 144/69 (BP Location: Left Arm, Patient Position: Sitting, Cuff Size: Normal)  Pulse 67   Temp 97.8 F (36.6 C) (Oral)   Resp 16   Ht 5\' 8"  (1.727 m)   Wt 144 lb (65.3 kg)   SpO2 98%   BMI 21.90 kg/m  Vitals:   05/30/18 1526  BP: (!) 144/69  Pulse: 67  Resp: 16  Temp: 97.8 F (36.6 C)  TempSrc: Oral  SpO2: 98%  Weight: 144 lb (65.3 kg)  Height: 5\' 8"  (1.727 m)     Physical Exam   General Appearance:    Alert, cooperative, no distress  Eyes:    PERRL, conjunctiva/corneas clear, EOM's intact       Lungs:     Clear to auscultation bilaterally, respirations unlabored    Heart:    Regular rate and rhythm  Neurologic:   Awake, alert, oriented x 3. No apparent focal neurological           defect.           Assessment & Plan:     1. CKD (chronic kidney disease), stage III (HCC) Currently off of lisinopril-hctz, but BP uncontrolled as below. Continue amlodipine 5mg  for now.  - Renal function panel - amLODipine (NORVASC) 5 MG tablet; Take 1 tablet (5 mg total) by mouth daily.  Dispense: 30 tablet; Refill: 2  2. Essential hypertension Consider starting back on lower dose of lisinopril. Continue amlodipine which was started at recent hospitalization.        Lelon Huh, MD  [facility]

## 2018-05-31 LAB — RENAL FUNCTION PANEL
Albumin: 4.3 g/dL (ref 3.5–4.7)
BUN / CREAT RATIO: 11 (ref 10–24)
BUN: 20 mg/dL (ref 8–27)
CO2: 22 mmol/L (ref 20–29)
Calcium: 9.6 mg/dL (ref 8.6–10.2)
Chloride: 106 mmol/L (ref 96–106)
Creatinine, Ser: 1.84 mg/dL — ABNORMAL HIGH (ref 0.76–1.27)
GFR calc Af Amer: 37 mL/min/{1.73_m2} — ABNORMAL LOW (ref >59)
GFR calc non Af Amer: 32 mL/min/{1.73_m2} — ABNORMAL LOW (ref >59)
GLUCOSE: 101 mg/dL — AB (ref 65–99)
PHOSPHORUS: 3.1 mg/dL (ref 2.5–4.5)
POTASSIUM: 4.1 mmol/L (ref 3.5–5.2)
SODIUM: 143 mmol/L (ref 134–144)

## 2018-06-02 ENCOUNTER — Telehealth: Payer: Self-pay | Admitting: *Deleted

## 2018-06-02 NOTE — Telephone Encounter (Signed)
LMOVM for pt to return call 

## 2018-06-02 NOTE — Telephone Encounter (Signed)
Patient advised. 3 week follow up scheduled.

## 2018-06-02 NOTE — Telephone Encounter (Signed)
-----   Message from [name], MD sent at 06/02/2018  8:13 AM EDT ----- Kidney functions stable. Continue current medications. Start back on lisinopril 10-12.5, but only take 1/2 tablet daily to get BP back down. Follow up in 3 weeks for BP check and to check kidney functions.

## 2018-06-11 DIAGNOSIS — G7000 Myasthenia gravis without (acute) exacerbation: Secondary | ICD-10-CM | POA: Diagnosis not present

## 2018-06-25 ENCOUNTER — Ambulatory Visit: Payer: Medicare Other | Admitting: Family Medicine

## 2018-06-25 NOTE — Progress Notes (Deleted)
       Patient: Jason Riley Male    DOB: [birth year]   84 y.o.   MRN: [id number] Visit Date: 06/25/2018  Today's Provider: Lelon Huh, MD   No chief complaint on file.  Subjective:    HPI   Hypertension, follow-up:  BP Readings from Last 3 Encounters:  05/30/18 (!) 144/69  05/11/18 118/61  11/25/17 130/68    He was last seen for hypertension 3 weeks ago.  BP at that visit was 144/69. Management since that visit includes; started back on lisinopril-HCTZ 10-12.5 mg 1/2 qd. Continue current dose of amlodipine. Advised to follow up in 3 weeks check bp and kidney functions .He reports {excellent/good/fair/poor:19665} compliance with treatment. He {ACTION; IS/IS WUJ:[id number]} having side effects. *** He {is/is not:9024} exercising. He {is/is not:9024} adherent to low salt diet.   Outside blood pressures are ***. He is experiencing {Symptoms; cardiac:12860}.  Patient denies {Symptoms; cardiac:12860}.   Cardiovascular risk factors include {cv risk factors:510}.  Use of agents associated with hypertension: {bp agents assoc with hypertension:511::"none"}.   ------------------------------------------------------------------------    Allergies  Allergen Reactions  . Atorvastatin Other (See Comments)    Elevated liver functions ("liver disorder")  . Sulfa Antibiotics Rash     Current Outpatient Medications:  .  amLODipine (NORVASC) 5 MG tablet, Take 1 tablet (5 mg total) by mouth daily., Disp: 30 tablet, Rfl: 2 .  aspirin (ASPIRIN LOW DOSE) 81 MG EC tablet, Take 81 mg by mouth daily. , Disp: , Rfl:  .  levothyroxine (SYNTHROID, LEVOTHROID) 50 MCG tablet, TAKE 1 TABLET BY MOUTH  DAILY, Disp: 90 tablet, Rfl: 3 .  mycophenolate (CELLCEPT) 250 MG capsule, Take 250 mg by mouth See admin instructions. Take 250 mg by mouth once a day for 1 week, then increase to 250 mg two times a day, Disp: , Rfl:  .  pyridostigmine (MESTINON) 60 MG tablet, Take 60 mg by mouth 4 (four) times daily.,  Disp: , Rfl:  .  rosuvastatin (CRESTOR) 20 MG tablet, TAKE 1 TABLET BY MOUTH  DAILY, Disp: 90 tablet, Rfl: 3 .  UNABLE TO FIND, "Ice Breakers mints": Dissolve 1 mint in the mouth as needed for dry throat, Disp: , Rfl:   Review of Systems  Constitutional: Negative for appetite change, chills and fever.  Respiratory: Negative for chest tightness, shortness of breath and wheezing.   Cardiovascular: Negative for chest pain and palpitations.  Gastrointestinal: Negative for abdominal pain, nausea and vomiting.    Social History   Tobacco Use  . Smoking status: Former Smoker    Years: 15.00    Types: Cigars  . Smokeless tobacco: Current User    Types: Chew  . Tobacco comment: quit over 30 years ago  Substance Use Topics  . Alcohol use: No    Alcohol/week: 0.0 standard drinks   Objective:   There were no vitals taken for this visit. There were no vitals filed for this visit.   Physical Exam      Assessment & Plan:           Lelon Huh, MD  [facility]

## 2018-06-30 ENCOUNTER — Telehealth: Payer: Self-pay | Admitting: Family Medicine

## 2018-06-30 DIAGNOSIS — G7000 Myasthenia gravis without (acute) exacerbation: Secondary | ICD-10-CM | POA: Diagnosis not present

## 2018-06-30 NOTE — Telephone Encounter (Signed)
Courtney with Dr. Weber Cooks office at [facility] called asking if were we still prescribing amlodipine for patient.  Their call back is [phone]  Thank s Con [place]

## 2018-07-01 DIAGNOSIS — R011 Cardiac murmur, unspecified: Secondary | ICD-10-CM | POA: Diagnosis not present

## 2018-07-01 DIAGNOSIS — I350 Nonrheumatic aortic (valve) stenosis: Secondary | ICD-10-CM | POA: Diagnosis not present

## 2018-07-01 DIAGNOSIS — E079 Disorder of thyroid, unspecified: Secondary | ICD-10-CM | POA: Diagnosis not present

## 2018-07-01 DIAGNOSIS — I10 Essential (primary) hypertension: Secondary | ICD-10-CM | POA: Diagnosis not present

## 2018-07-01 DIAGNOSIS — R0602 Shortness of breath: Secondary | ICD-10-CM | POA: Diagnosis not present

## 2018-07-01 NOTE — Telephone Encounter (Signed)
Office closed at 4pm. Will call back in the morning.

## 2018-07-01 NOTE — Telephone Encounter (Signed)
Yes. Is there a reason they are asking?

## 2018-07-01 NOTE — Telephone Encounter (Signed)
Please advise 

## 2018-07-02 NOTE — Telephone Encounter (Signed)
Per Loma Sousa, Dr Melrose Nakayama thought patient was confused about his medications and has not been taking amlodipine. Apparently the pharmacy did not receive the refill sent on 05/30/2018. Courtney confirmed with pharmacy. Dr. Melrose Nakayama wanted to make Dr. Caryn Section aware that patient is most likely not taking amlodipine. Was unable to contact the patient.

## 2018-09-22 ENCOUNTER — Other Ambulatory Visit: Payer: Self-pay

## 2018-09-22 NOTE — Patient Outreach (Signed)
[facility]) Care Management  09/22/2018  Jason Riley 1929-04-02 [id number]   Medication Adherence call to Jason Riley left a message for patient to call back patient is due on Rosuvastatin 20 mg and Lisinopril / HCTZ 10/12.5 mg. Jason Riley is showing past due under [place].   [facility] Direct Dial [phone]  Fax [phone] [email or web address]

## 2018-10-06 ENCOUNTER — Encounter: Payer: Self-pay | Admitting: Emergency Medicine

## 2018-10-06 ENCOUNTER — Emergency Department
Admission: EM | Admit: 2018-10-06 | Discharge: 2018-10-06 | Disposition: A | Discharge status: Home / Self Care | Payer: Medicare Other | Attending: Emergency Medicine | Admitting: Emergency Medicine

## 2018-10-06 ENCOUNTER — Other Ambulatory Visit: Payer: Self-pay

## 2018-10-06 DIAGNOSIS — Y998 Other external cause status: Secondary | ICD-10-CM | POA: Diagnosis not present

## 2018-10-06 DIAGNOSIS — I10 Essential (primary) hypertension: Secondary | ICD-10-CM | POA: Diagnosis not present

## 2018-10-06 DIAGNOSIS — S61412A Laceration without foreign body of left hand, initial encounter: Secondary | ICD-10-CM | POA: Insufficient documentation

## 2018-10-06 DIAGNOSIS — N183 Chronic kidney disease, stage 3 (moderate): Secondary | ICD-10-CM | POA: Insufficient documentation

## 2018-10-06 DIAGNOSIS — Z87891 Personal history of nicotine dependence: Secondary | ICD-10-CM | POA: Diagnosis not present

## 2018-10-06 DIAGNOSIS — Y93I9 Activity, other involving external motion: Secondary | ICD-10-CM | POA: Insufficient documentation

## 2018-10-06 DIAGNOSIS — R9431 Abnormal electrocardiogram [ECG] [EKG]: Secondary | ICD-10-CM | POA: Diagnosis not present

## 2018-10-06 DIAGNOSIS — Z79899 Other long term (current) drug therapy: Secondary | ICD-10-CM | POA: Insufficient documentation

## 2018-10-06 DIAGNOSIS — Y92410 Unspecified street and highway as the place of occurrence of the external cause: Secondary | ICD-10-CM | POA: Diagnosis not present

## 2018-10-06 DIAGNOSIS — E785 Hyperlipidemia, unspecified: Secondary | ICD-10-CM | POA: Insufficient documentation

## 2018-10-06 NOTE — ED Notes (Signed)
Pt taken to car via wheelchair with family member. , VSS, NAD. Discharge instructions and follow up reviewed. All questions and concerns addressed.
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
## 2018-10-06 NOTE — Discharge Instructions (Addendum)
Please seek medical attention for any high fevers, chest pain, shortness of breath, change in behavior, persistent vomiting, bloody stool or any other new or concerning symptoms.  
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
## 2018-10-27 ENCOUNTER — Other Ambulatory Visit: Payer: Self-pay

## 2018-10-27 NOTE — Patient Outreach (Signed)
[facility]) Care Management  10/27/2018  Jason Riley Mar 05, 1929 [id number]   Medication Adherence call to Mr. Jason Riley spoke with patient he is due on on Rosuvastatin 20 mg and Lisinopril/HCTZ 10/12.5 mg patient ask if we can call [facility] an order both medication, [facility] will fill and mail out both medication with in 7-10 days. Jason Riley is showing past due under [place].   [place] Management Direct Dial [phone]  Fax [phone] [email or web address]

## 2018-11-12 ENCOUNTER — Encounter: Payer: Self-pay | Admitting: Family Medicine

## 2018-11-12 ENCOUNTER — Ambulatory Visit (INDEPENDENT_AMBULATORY_CARE_PROVIDER_SITE_OTHER): Payer: Medicare Other | Admitting: Family Medicine

## 2018-11-12 VITALS — BP 138/60 | HR 58 | Temp 97.5°F | Resp 18 | Wt 151.0 lb

## 2018-11-12 DIAGNOSIS — E039 Hypothyroidism, unspecified: Secondary | ICD-10-CM | POA: Diagnosis not present

## 2018-11-12 DIAGNOSIS — N183 Chronic kidney disease, stage 3 unspecified: Secondary | ICD-10-CM

## 2018-11-12 DIAGNOSIS — I2510 Atherosclerotic heart disease of native coronary artery without angina pectoris: Secondary | ICD-10-CM | POA: Diagnosis not present

## 2018-11-12 DIAGNOSIS — I10 Essential (primary) hypertension: Secondary | ICD-10-CM | POA: Diagnosis not present

## 2018-11-12 DIAGNOSIS — E785 Hyperlipidemia, unspecified: Secondary | ICD-10-CM | POA: Diagnosis not present

## 2018-11-12 DIAGNOSIS — G7000 Myasthenia gravis without (acute) exacerbation: Secondary | ICD-10-CM

## 2018-11-12 NOTE — Patient Instructions (Addendum)
.   Please bring all of your medications to every appointment so we can make sure that our medication list is the same as yours.    Please contact Dr. Lannie Fields office [facility] neurology) at [phone] to get a refill for pyridostigmine and Mycophenolate

## 2018-11-12 NOTE — Progress Notes (Signed)
Patient: Jason Riley Male    DOB: February 16, 1929   85 y.o.   MRN: [id number] Visit Date: 11/12/2018  Today's Provider: Lelon Huh, MD   Chief Complaint  Patient presents with  . Hypertension   Subjective:     HPI  Hypertension, follow-up:  BP Readings from Last 3 Encounters:  11/12/18 138/60  10/06/18 (!) 177/77  05/30/18 (!) 144/69    He was last seen for hypertension 4 months ago.  BP at that visit was 144/69. Management since that visit includes starting back on Lisinopril/ HCTZ 10/12.5mg  1/2 tablet daily which had previously been discontinued due to AKI He reports fair compliance with treatment. Patient states he never started Amlodipine, and he takes one full tablet of Lisinopril/ HCTZ daily. He is not having side effects.  He is exercising. He is not adherent to low salt diet.   Outside blood pressures are not being checked. He is experiencing none.  Patient denies chest pain, chest pressure/discomfort, claudication, dyspnea, exertional chest pressure/discomfort, fatigue, irregular heart beat, lower extremity edema, near-syncope, orthopnea, palpitations, paroxysmal nocturnal dyspnea, syncope and tachypnea.   Cardiovascular risk factors include advanced age (older than 66 for men, 53 for women), hypertension and male gender.  Use of agents associated with hypertension: NSAIDS.     Weight trend: increasing steadily Wt Readings from Last 3 Encounters:  11/12/18 151 lb (68.5 kg)  10/06/18 142 lb (64.4 kg)  05/30/18 144 lb (65.3 kg)    Current diet: well balanced  ------------------------------------------------------------------------  Follow up for CKD:  The patient was last seen for this 4 months ago. Changes made at last visit include starting back on Lisinopril/ HCTZ 1/2 tab daily, which he has since increased back to 1 full tablet a day.   He reports fair compliance with treatment. He feels that condition is Unchanged. He is not having side  effects.   ------------------------------------------------------------------------------------ Follow up hypothyroid Lab Results  Component Value Date   TSH 2.100 05/03/2018   Is doing well with current dose of levothyroxine  Allergies  Allergen Reactions  . Atorvastatin Other (See Comments)    Elevated liver functions ("liver disorder")  . Sulfa Antibiotics Rash     Current Outpatient Medications:  .  aspirin (ASPIRIN LOW DOSE) 81 MG EC tablet, Take 81 mg by mouth daily. , Disp: , Rfl:  .  levothyroxine (SYNTHROID, LEVOTHROID) 50 MCG tablet, TAKE 1 TABLET BY MOUTH  DAILY, Disp: 90 tablet, Rfl: 3 .  rosuvastatin (CRESTOR) 20 MG tablet, TAKE 1 TABLET BY MOUTH  DAILY, Disp: 90 tablet, Rfl: 3 .  lisinopril-hydrochlorothiazide (PRINZIDE,ZESTORETIC) 10-12.5 MG tablet, Take 1 tablet by mouth daily., Disp: , Rfl:  .  mycophenolate (CELLCEPT) 250 MG capsule, Take 250 mg by mouth See admin instructions. Take 250 mg by mouth once a day for 1 week, then increase to 250 mg two times a day, Disp: , Rfl:  .  pyridostigmine (MESTINON) 60 MG tablet, Take 60 mg by mouth 4 (four) times daily., Disp: , Rfl:  .  UNABLE TO FIND, "Ice Breakers mints": Dissolve 1 mint in the mouth as needed for dry throat, Disp: , Rfl:   Review of Systems  Constitutional: Negative for appetite change, chills and fever.  Eyes: Positive for visual disturbance (trouble with visual focus).  Respiratory: Negative for chest tightness, shortness of breath and wheezing.   Cardiovascular: Negative for chest pain and palpitations.  Gastrointestinal: Negative for abdominal pain, nausea and vomiting.    Social  History   Tobacco Use  . Smoking status: Former Smoker    Years: 15.00    Types: Cigars  . Smokeless tobacco: Current User    Types: Chew  . Tobacco comment: quit over 30 years ago  Substance Use Topics  . Alcohol use: No    Alcohol/week: 0.0 standard drinks      Objective:   BP 138/60 (BP Location: Left Arm,  Cuff Size: Normal)   Pulse (!) 58   Temp (!) 97.5 F (36.4 C) (Oral)   Resp 18   Wt 151 lb (68.5 kg)   SpO2 98% Comment: room air  BMI 22.30 kg/m  Vitals:   11/12/18 1346 11/12/18 1349  BP: (!) 142/62 138/60  Pulse: (!) 58   Resp: 18   Temp: (!) 97.5 F (36.4 C)   TempSrc: Oral   SpO2: 98%   Weight: 151 lb (68.5 kg)      Physical Exam   General Appearance:    Alert, cooperative, no distress  Eyes:    PERRL, conjunctiva/corneas clear, EOM's intact       Lungs:     Clear to auscultation bilaterally, respirations unlabored  Heart:    Regular rate and rhythm, III/VI blowing systolic murmur  Neurologic:   Awake, alert, oriented x 3. No apparent focal neurological           defect.          Assessment & Plan    1. Essential hypertension Stable now that he is back on full dose of lisinopril/hctz. Check renal as below.   2. Hyperlipidemia, unspecified hyperlipidemia type He is tolerating rosuvastatin well with no adverse effects.    3. CKD (chronic kidney disease), stage III ([place])  - Renal function panel  4. Hypothyroidism, unspecified type  - TSH  5. Coronary artery disease involving native coronary artery of native heart without angina pectoris Asymptomatic. Compliant with medication.  Continue aggressive risk factor modification.  Follow up with Dr. Clayborn Bigness in March .ach   6. Myasthenia gravis ([place]) Current out of his M.G. medications and advised to call Dr. Lannie Fields office ASAP for refill.      Lelon Huh, MD  [facility]

## 2018-11-13 LAB — RENAL FUNCTION PANEL
Albumin: 4.4 g/dL (ref 3.6–4.6)
BUN/Creatinine Ratio: 15 (ref 10–24)
BUN: 27 mg/dL (ref 8–27)
CALCIUM: 9.7 mg/dL (ref 8.6–10.2)
CHLORIDE: 103 mmol/L (ref 96–106)
CO2: 25 mmol/L (ref 20–29)
Creatinine, Ser: 1.83 mg/dL — ABNORMAL HIGH (ref 0.76–1.27)
GFR calc Af Amer: 38 mL/min/{1.73_m2} — ABNORMAL LOW (ref >59)
GFR calc non Af Amer: 32 mL/min/{1.73_m2} — ABNORMAL LOW (ref >59)
GLUCOSE: 90 mg/dL (ref 65–99)
PHOSPHORUS: 3.3 mg/dL (ref 2.8–4.1)
POTASSIUM: 4.9 mmol/L (ref 3.5–5.2)
SODIUM: 143 mmol/L (ref 134–144)

## 2018-11-13 LAB — TSH: TSH: 2.6 u[IU]/mL (ref 0.450–4.500)

## 2018-11-18 ENCOUNTER — Ambulatory Visit: Payer: Medicare Other

## 2018-11-25 ENCOUNTER — Telehealth: Payer: Self-pay

## 2018-11-25 NOTE — Telephone Encounter (Signed)
LMTCB and schedule previously cancelled AWV. -MM

## 2018-12-11 DIAGNOSIS — G7000 Myasthenia gravis without (acute) exacerbation: Secondary | ICD-10-CM | POA: Diagnosis not present

## 2018-12-23 DIAGNOSIS — G7000 Myasthenia gravis without (acute) exacerbation: Secondary | ICD-10-CM | POA: Diagnosis not present

## 2018-12-30 DIAGNOSIS — R011 Cardiac murmur, unspecified: Secondary | ICD-10-CM | POA: Diagnosis not present

## 2018-12-30 DIAGNOSIS — E079 Disorder of thyroid, unspecified: Secondary | ICD-10-CM | POA: Diagnosis not present

## 2018-12-30 DIAGNOSIS — M1990 Unspecified osteoarthritis, unspecified site: Secondary | ICD-10-CM | POA: Diagnosis not present

## 2018-12-30 DIAGNOSIS — I10 Essential (primary) hypertension: Secondary | ICD-10-CM | POA: Diagnosis not present

## 2018-12-30 DIAGNOSIS — I350 Nonrheumatic aortic (valve) stenosis: Secondary | ICD-10-CM | POA: Diagnosis not present

## 2019-02-27 IMAGING — DX DG CHEST 2V
2 series · 2 of 2 positions shown · non-contrast
Comparison: Chest CT April 16, 2018

CLINICAL DATA: Shortness of breath

EXAM:
CHEST - 2 VIEW

[chest lat]
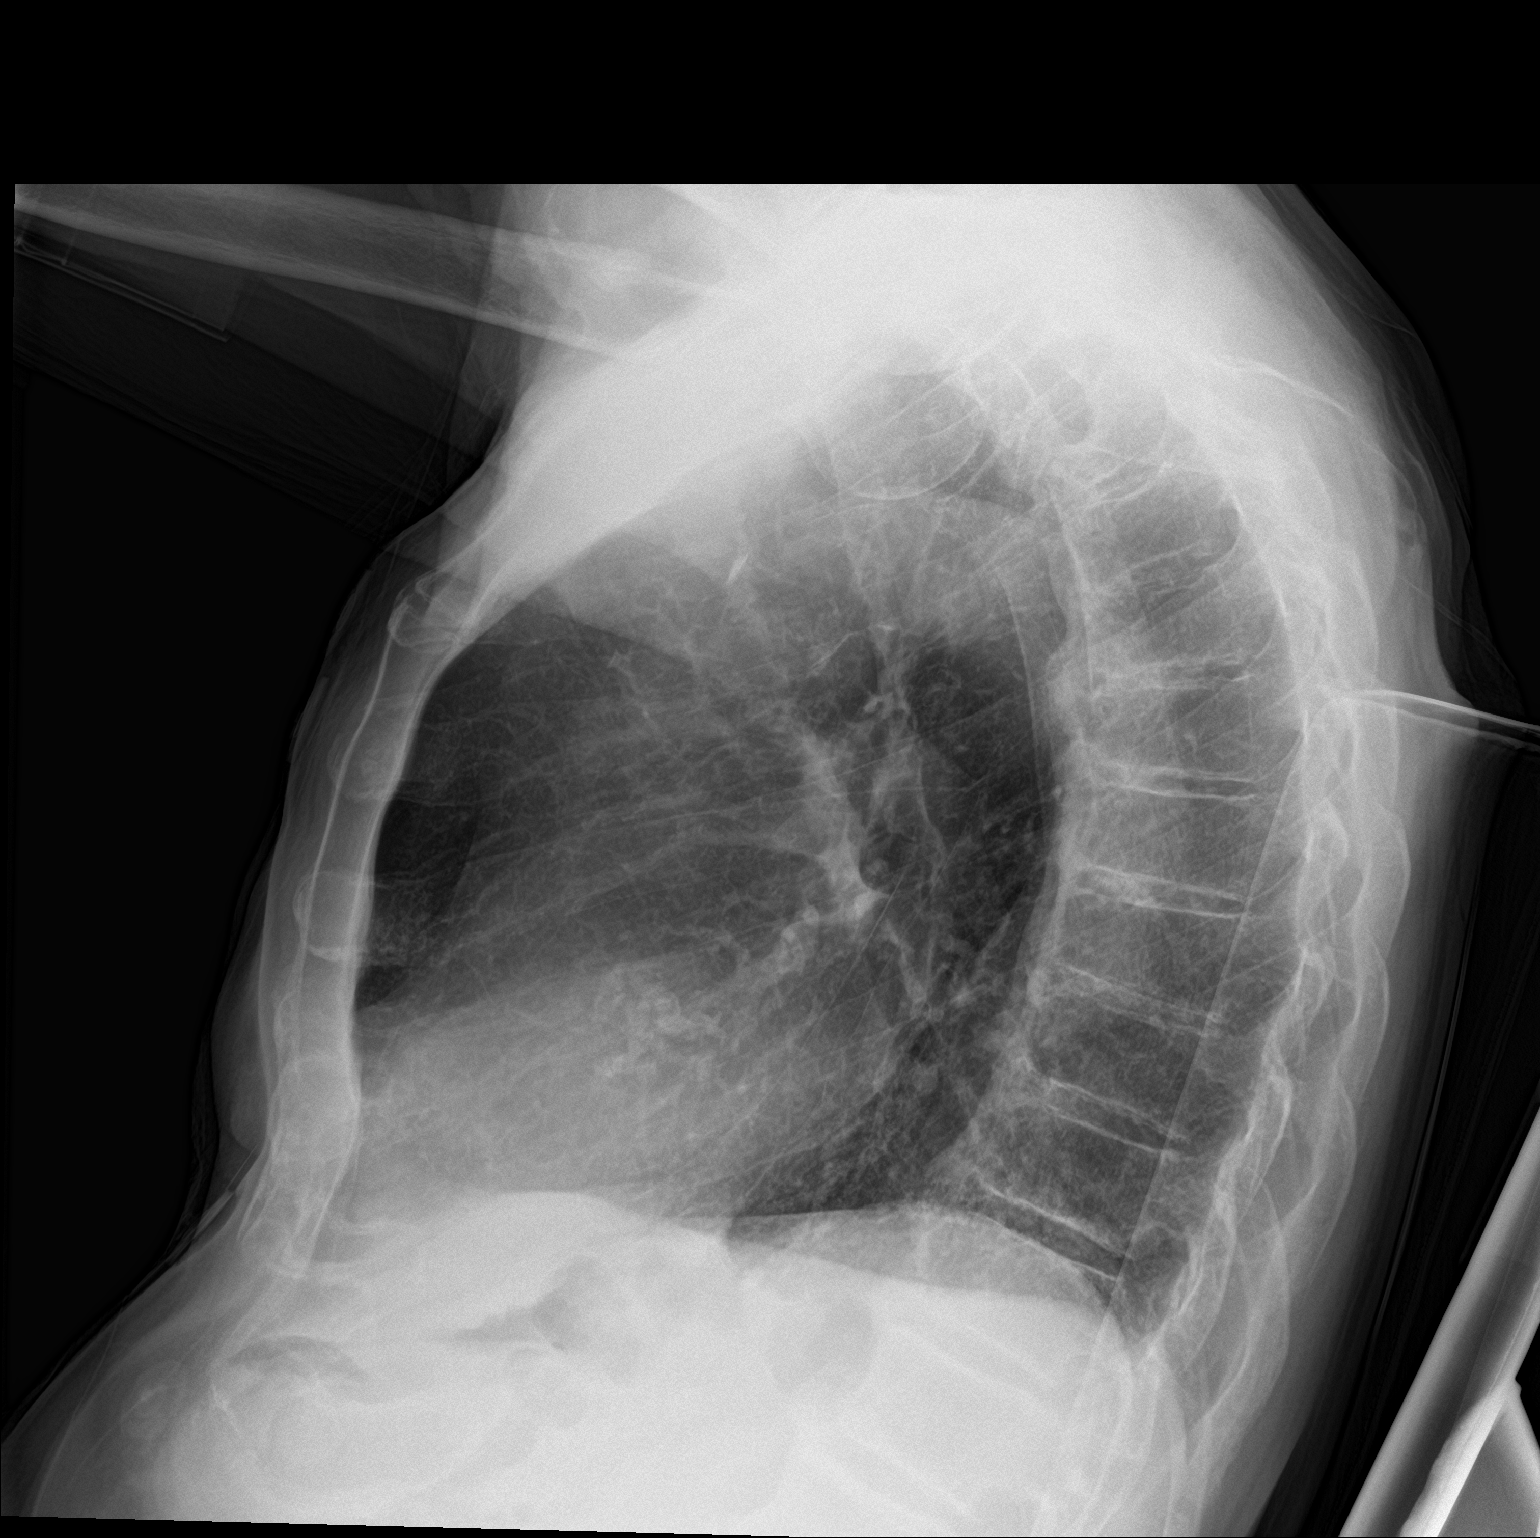

[chest ap]
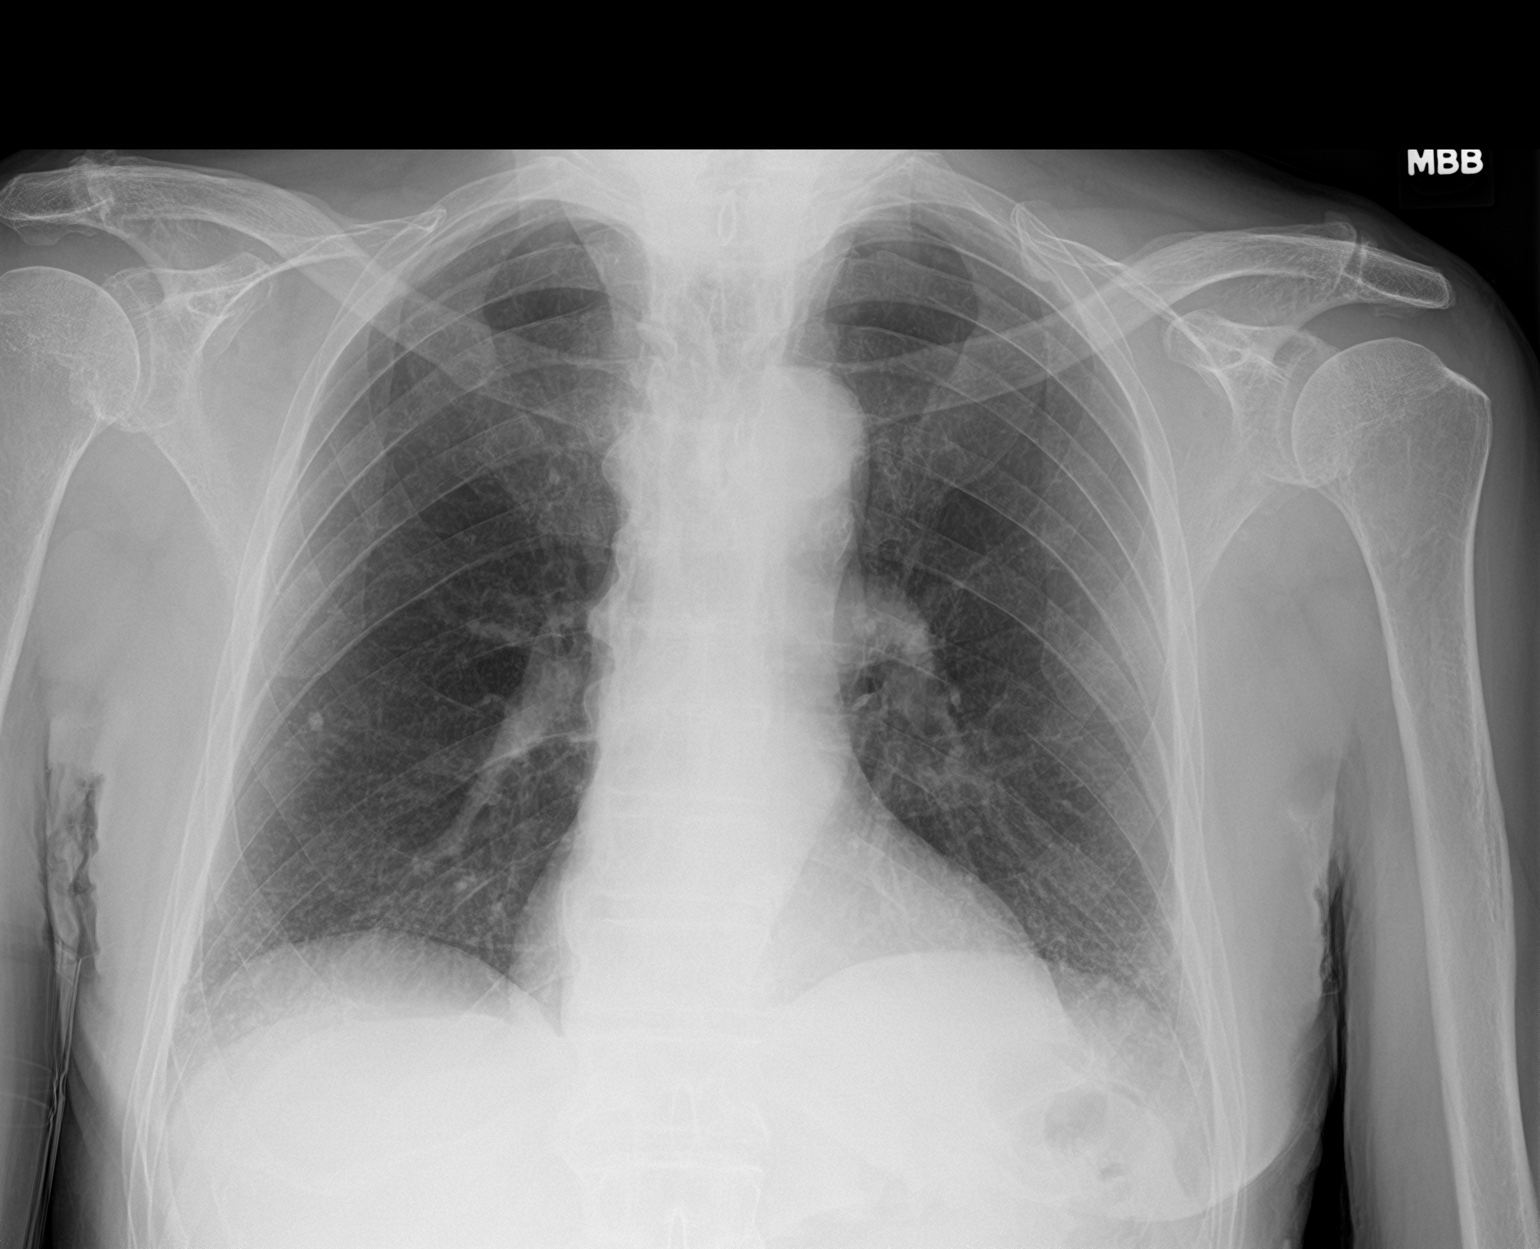

[2 of 2 positions shown; findings below may reference images not displayed]

FINDINGS: There is a calcified granuloma in the right mid lung. There is no
evident edema or consolidation. The heart size and pulmonary
vascularity are normal. No adenopathy. There is aortic
atherosclerosis. There is calcification in the aortic valve region.
There is degenerative change in the thoracic spine..
IMPRESSION: Calcified granuloma right mid lung.  No edema or consolidation.

There is aortic atherosclerosis. Heart size normal. Calcification in
the aortic valve raises concern for potential degree of aortic
stenosis.

Aortic Atherosclerosis ([id number]).

## 2019-03-23 ENCOUNTER — Other Ambulatory Visit: Payer: Self-pay | Admitting: Family Medicine

## 2019-05-04 ENCOUNTER — Telehealth: Payer: Self-pay | Admitting: Family Medicine

## 2019-05-04 NOTE — Chronic Care Management (AMB) (Signed)
Chronic Care Management   Note  05/04/2019 Name: Jason Riley MRN: [id number] DOB: 10-29-1928  Jason Riley is a 85 y.o. year old male who is a primary care patient of Fisher, Kirstie Peri, MD. I reached out to [place] by phone today in response to a referral sent by [name] health plan.    [name] was given information about Chronic Care Management services today including:  1. CCM service includes personalized support from designated clinical staff supervised by his physician, including individualized plan of care and coordination with other care providers 2. 24/7 contact phone numbers for assistance for urgent and routine care needs. 3. Service will only be billed when office clinical staff spend 20 minutes or more in a month to coordinate care. 4. Only one practitioner may furnish and bill the service in a calendar month. 5. The patient may stop CCM services at any time (effective at the end of the month) by phone call to the office staff. 6. The patient will be responsible for cost sharing (co-pay) of up to 20% of the service fee (after annual deductible is met).  Patient did not agree to enrollment in care management services and does not wish to consider at this time.  Follow up plan: The patient has been provided with contact information for the chronic care management team and has been advised to call with any health related questions or concerns.   [place]  [email or web address]   ??[phone]

## 2019-05-05 ENCOUNTER — Other Ambulatory Visit: Payer: Self-pay

## 2019-05-05 NOTE — Patient Outreach (Signed)
[facility]) Care Management  05/05/2019  Jason Riley 1929/02/19 [id number]   Medication Adherence call to Mr. Jason Riley [place] Identifiers Verify spoke with patient he is past due on Rosuvastatin 20 mg and Lisinopril /Hctz 10/12.5 mg patient explain he is taking 1 tablet daily and has enough for another week patient ask if we can call [facility] an order both medications patient will received from [facility] with in 5-7 days. Jason Riley is showing oast due under [place].   [place] Management Direct Dial [phone]  Fax [phone] [email or web address]

## 2019-05-15 ENCOUNTER — Ambulatory Visit: Payer: Self-pay | Admitting: Family Medicine

## 2019-05-22 ENCOUNTER — Ambulatory Visit: Payer: Self-pay | Admitting: Family Medicine

## 2019-06-08 ENCOUNTER — Ambulatory Visit: Payer: Self-pay | Admitting: Family Medicine

## 2019-08-11 ENCOUNTER — Other Ambulatory Visit: Payer: Self-pay

## 2019-08-11 NOTE — Patient Outreach (Signed)
[facility]) Care Management  08/11/2019  JERROLL CHERRY 02-Feb-1929 [id number]   Medication Adherence call to Mr. Varun Fowlks Telephone call to Patient regarding Medication Adherence unable to reach patient, Mr. Pasley answer but was not able to communicate patient is past due on Rosuvastatin 20 mg and Lisinopril/Hctz 10/12.5 mg under [place].   [place] Management Direct Dial [phone]  Fax [phone] [email or web address]

## 2019-08-25 ENCOUNTER — Other Ambulatory Visit: Payer: Self-pay

## 2019-08-25 NOTE — Patient Outreach (Signed)
[facility]) Care Management  08/25/2019  Jason Riley 1929-10-19 [id number]   Medication Adherence call to Jason Riley HIPPA Compliant Voice message left with a call back number. Jason Riley is showing past due on Rosuvastatin 20 mg and Lisinopril/Hctz 10/12.5 mg under [place].   [place] Management Direct Dial [phone]  Fax [phone] [email or web address]

## 2019-09-22 ENCOUNTER — Other Ambulatory Visit: Payer: Self-pay

## 2019-09-22 NOTE — Patient Outreach (Signed)
[facility]) Care Management  09/22/2019  Jason Riley 10-May-1929 [id number]  Medication Adherence call to Mr. Jason Riley Telephone call to Patient regarding Medication Adherence unable to reach patient. Jason Riley is showing past due on Rosuvastatin 20 mg and Lisinopril/Hctz 10/12.5 mg under [place].   [place] Management Direct Dial [phone]  Fax [phone] [email or web address]

## 2019-11-12 ENCOUNTER — Other Ambulatory Visit: Payer: Self-pay

## 2019-11-12 ENCOUNTER — Telehealth: Payer: Self-pay

## 2019-11-12 ENCOUNTER — Encounter: Payer: Self-pay | Admitting: Physician Assistant

## 2019-11-12 ENCOUNTER — Ambulatory Visit (INDEPENDENT_AMBULATORY_CARE_PROVIDER_SITE_OTHER): Payer: Medicare Other | Admitting: Physician Assistant

## 2019-11-12 DIAGNOSIS — I350 Nonrheumatic aortic (valve) stenosis: Secondary | ICD-10-CM

## 2019-11-12 DIAGNOSIS — R0602 Shortness of breath: Secondary | ICD-10-CM

## 2019-11-12 DIAGNOSIS — R5383 Other fatigue: Secondary | ICD-10-CM

## 2019-11-12 NOTE — Progress Notes (Signed)
Patient: Jason Riley Male    DOB: September 10, 1929   86 y.o.   MRN: [id number] Visit Date: 11/12/2019  Today's Provider: Mar Daring, PA-C   No chief complaint on file.  Subjective:    Virtual Visit via Telephone Note  I connected with [place] on 11/12/19 at  5:40 PM EST by telephone and verified that I am speaking with the correct person using two identifiers.  Location: Patient: Home Provider: [facility]   I discussed the limitations, risks, security and privacy concerns of performing an evaluation and management service by telephone and the availability of in person appointments. I also discussed with the patient that there may be a patient responsible charge related to this service. The patient expressed understanding and agreed to proceed.   Shortness of Breath This is a new problem. The current episode started 1 to 4 weeks ago.    Pt states he is not taking any medications.  He ran out of them and has not been able to refill them. Reports he has been out of medications for 6 months.     Allergies  Allergen Reactions  . Atorvastatin Other (See Comments)    Elevated liver functions ("liver disorder")  . Sulfa Antibiotics Rash     Current Outpatient Medications:  .  aspirin (ASPIRIN LOW DOSE) 81 MG EC tablet, Take 81 mg by mouth daily. , Disp: , Rfl:  .  levothyroxine (SYNTHROID) 50 MCG tablet, TAKE 1 TABLET BY MOUTH  DAILY, Disp: 90 tablet, Rfl: 3 .  lisinopril-hydrochlorothiazide (ZESTORETIC) 10-12.5 MG tablet, TAKE 1 TABLET BY MOUTH  DAILY, Disp: 90 tablet, Rfl: 3 .  mycophenolate (CELLCEPT) 250 MG capsule, Take 250 mg by mouth See admin instructions. Take 250 mg by mouth once a day for 1 week, then increase to 250 mg two times a day, Disp: , Rfl:  .  pyridostigmine (MESTINON) 60 MG tablet, Take 60 mg by mouth 4 (four) times daily., Disp: , Rfl:  .  rosuvastatin (CRESTOR) 20 MG tablet, TAKE 1 TABLET BY MOUTH  DAILY, Disp: 90 tablet, Rfl: 3 .  UNABLE TO FIND,  "Ice Breakers mints": Dissolve 1 mint in the mouth as needed for dry throat, Disp: , Rfl:   Review of Systems  Constitutional: Negative.   Respiratory: Positive for cough and shortness of breath.     Social History   Tobacco Use  . Smoking status: Former Smoker    Years: 15.00    Types: Cigars  . Smokeless tobacco: Current User    Types: Chew  . Tobacco comment: quit over 30 years ago  Substance Use Topics  . Alcohol use: No    Alcohol/week: 0.0 standard drinks      Objective:   There were no vitals taken for this visit. There were no vitals filed for this visit.There is no height or weight on file to calculate BMI.   Physical Exam Vitals reviewed.  Constitutional:      General: He is not in acute distress. Pulmonary:     Effort: No respiratory distress (able to talk in full sentences).  Neurological:     Mental Status: He is alert.      No results found for any visits on 11/12/19.     Assessment & Plan    1. Severe aortic stenosis Patient is having SOB and fatigue worsening over the last month. He does have known CAD and aortic stenosis. I feel patient would benefit from in  office evaluation due to symptoms, not taking medications, and not having labs in quite a while. He is going to come in the office on Monday at 11am with me.   2. SOB (shortness of breath) See above medical treatment plan.  3. Fatigue, unspecified type See above medical treatment plan.  I discussed the assessment and treatment plan with the patient. The patient was provided an opportunity to ask questions and all were answered. The patient agreed with the plan and demonstrated an understanding of the instructions.   The patient was advised to call back or seek an in-person evaluation if the symptoms worsen or if the condition fails to improve as anticipated.  I provided 14 minutes of non-face-to-face time during this encounter.      Mar Daring, PA-C  [facility]

## 2019-11-12 NOTE — Telephone Encounter (Signed)
LMTCB  11/12/2019  Ms Jonetta Speak is not on his DRP.  She was not able to get in touch with him to schedule a virtual visit. I will try back in a few minutes.   Thanks,   -Mickel Baas

## 2019-11-12 NOTE — Telephone Encounter (Signed)
Apt 11/12/2019 at 5:20  Thanks,   -Mickel Baas

## 2019-11-12 NOTE — Telephone Encounter (Signed)
Copied from [place] [phone]. Topic: Appointment Scheduling - Scheduling Inquiry for Clinic >> Nov 12, 2019  9:05 AM Greggory Keen D wrote: Reason for CRM: Jason Riley pt's niece called to schedule an appt for her uncle [name].  He has not been in the office in a while and is having some SOB, Loss of appetite. Weight loss.  She wants him to actually come in the office to be seen.  Please advise [phone]

## 2019-11-16 ENCOUNTER — Ambulatory Visit (INDEPENDENT_AMBULATORY_CARE_PROVIDER_SITE_OTHER): Payer: Medicare Other | Admitting: Physician Assistant

## 2019-11-16 ENCOUNTER — Encounter: Payer: Self-pay | Admitting: Physician Assistant

## 2019-11-16 ENCOUNTER — Other Ambulatory Visit: Payer: Self-pay

## 2019-11-16 VITALS — BP 145/74 | HR 79 | Temp 97.5°F | Resp 16 | Wt 133.2 lb

## 2019-11-16 DIAGNOSIS — I10 Essential (primary) hypertension: Secondary | ICD-10-CM

## 2019-11-16 DIAGNOSIS — I080 Rheumatic disorders of both mitral and aortic valves: Secondary | ICD-10-CM

## 2019-11-16 DIAGNOSIS — R0602 Shortness of breath: Secondary | ICD-10-CM

## 2019-11-16 DIAGNOSIS — R5382 Chronic fatigue, unspecified: Secondary | ICD-10-CM | POA: Diagnosis not present

## 2019-11-16 DIAGNOSIS — E034 Atrophy of thyroid (acquired): Secondary | ICD-10-CM

## 2019-11-16 DIAGNOSIS — N1832 Chronic kidney disease, stage 3b: Secondary | ICD-10-CM | POA: Diagnosis not present

## 2019-11-16 DIAGNOSIS — G7000 Myasthenia gravis without (acute) exacerbation: Secondary | ICD-10-CM

## 2019-11-16 DIAGNOSIS — E782 Mixed hyperlipidemia: Secondary | ICD-10-CM | POA: Diagnosis not present

## 2019-11-16 MED ORDER — LISINOPRIL-HYDROCHLOROTHIAZIDE 10-12.5 MG PO TABS: 1.0000 | ORAL_TABLET | Freq: Every day | ORAL | 0 refills | Status: DC

## 2019-11-16 MED ORDER — PYRIDOSTIGMINE BROMIDE 60 MG PO TABS: 60.0000 mg | ORAL_TABLET | Freq: Four times a day (QID) | ORAL | 0 refills | Status: DC

## 2019-11-16 MED ORDER — ROSUVASTATIN CALCIUM 20 MG PO TABS: 20.0000 mg | ORAL_TABLET | Freq: Every day | ORAL | 0 refills | Status: AC

## 2019-11-16 MED ORDER — LEVOTHYROXINE SODIUM 50 MCG PO TABS: 50.0000 ug | ORAL_TABLET | Freq: Every day | ORAL | 0 refills | Status: AC

## 2019-11-16 MED ORDER — MYCOPHENOLATE MOFETIL 250 MG PO CAPS: 250.0000 mg | ORAL_CAPSULE | ORAL | 0 refills | Status: DC

## 2019-11-16 NOTE — Progress Notes (Signed)
Patient: Jason Riley Male    DOB: 1929/04/27   86 y.o.   MRN: [id number] Visit Date: 11/16/2019  Today's Provider: Mar Daring, PA-C   Chief Complaint  Patient presents with  . Fatigue   Subjective:     HPI  Patient here with c/o fatigue and SOB. Fatigue: Patient complains of fatigue/tired. He reports sob for the past 3 months and is worsening. Denies dizziness, chest pain, fever. Patient reports that he can't eat much. Patient with history of Aortic valve stenosis and myasthenia gravis. States as long as he is sitting he feels ok. When he has to get up to do anything he will tire easily and have to sit back down. Has been without medications for at least 2-3 months, possibly even as long as 6 months. Had an issue with [employer] and he became frustrated when he tried to call to change the credit card to a new card that he just hung up and has been without all medications since then. His niece accompanies him today as she has become his primary caregiver since his son passed 2 years ago. However, she lives in [place] so she is unable to be with him all the time.   Wt Readings from Last 3 Encounters:  11/16/19 133 lb 3.2 oz (60.4 kg)  11/12/18 151 lb (68.5 kg)  10/06/18 142 lb (64.4 kg)    Allergies  Allergen Reactions  . Atorvastatin Other (See Comments)    Elevated liver functions ("liver disorder")  . Sulfa Antibiotics Rash     Current Outpatient Medications:  .  aspirin (ASPIRIN LOW DOSE) 81 MG EC tablet, Take 81 mg by mouth daily. , Disp: , Rfl:  .  levothyroxine (SYNTHROID) 50 MCG tablet, TAKE 1 TABLET BY MOUTH  DAILY (Patient not taking: Reported on 11/16/2019), Disp: 90 tablet, Rfl: 3 .  lisinopril-hydrochlorothiazide (ZESTORETIC) 10-12.5 MG tablet, TAKE 1 TABLET BY MOUTH  DAILY (Patient not taking: Reported on 11/16/2019), Disp: 90 tablet, Rfl: 3 .  mycophenolate (CELLCEPT) 250 MG capsule, Take 250 mg by mouth See admin instructions. Take 250 mg by mouth once  a day for 1 week, then increase to 250 mg two times a day, Disp: , Rfl:  .  pyridostigmine (MESTINON) 60 MG tablet, Take 60 mg by mouth 4 (four) times daily., Disp: , Rfl:  .  rosuvastatin (CRESTOR) 20 MG tablet, TAKE 1 TABLET BY MOUTH  DAILY (Patient not taking: Reported on 11/16/2019), Disp: 90 tablet, Rfl: 3 .  UNABLE TO FIND, "Ice Breakers mints": Dissolve 1 mint in the mouth as needed for dry throat, Disp: , Rfl:   Review of Systems  Constitutional: Positive for fatigue.  HENT: Negative.   Respiratory: Positive for shortness of breath. Negative for cough, chest tightness and wheezing.   Cardiovascular: Negative for chest pain, palpitations and leg swelling.  Gastrointestinal: Negative for abdominal pain, blood in stool, constipation, diarrhea and rectal pain.  Neurological: Positive for weakness. Negative for dizziness, light-headedness and headaches.    Social History   Tobacco Use  . Smoking status: Former Smoker    Years: 15.00    Types: Cigars  . Smokeless tobacco: Current User    Types: Chew  . Tobacco comment: quit over 30 years ago  Substance Use Topics  . Alcohol use: No    Alcohol/week: 0.0 standard drinks      Objective:   BP (!) 145/74 (BP Location: Left Arm, Patient Position: Sitting, Cuff Size: Large)  Pulse 79   Temp (!) 97.5 F (36.4 C) (Temporal)   Resp 16   Wt 133 lb 3.2 oz (60.4 kg)   BMI 19.67 kg/m  Vitals:   11/16/19 1112  BP: (!) 145/74  Pulse: 79  Resp: 16  Temp: (!) 97.5 F (36.4 C)  TempSrc: Temporal  Weight: 133 lb 3.2 oz (60.4 kg)  Body mass index is 19.67 kg/m.   Physical Exam Vitals reviewed.  Constitutional:      General: He is not in acute distress.    Appearance: Normal appearance. He is well-developed and normal weight. He is not ill-appearing or diaphoretic.  HENT:     Head: Normocephalic and atraumatic.  Neck:     Thyroid: No thyromegaly.     Vascular: No JVD.     Trachea: No tracheal deviation.  Cardiovascular:      Rate and Rhythm: Normal rate and regular rhythm.     Pulses: Normal pulses.     Heart sounds: Murmur present. No friction rub. No gallop.   Pulmonary:     Effort: Pulmonary effort is normal. No respiratory distress.     Breath sounds: Normal breath sounds. No wheezing or rales.  Abdominal:     General: Abdomen is flat. Bowel sounds are normal.     Palpations: Abdomen is soft. There is no mass.     Tenderness: There is no abdominal tenderness.  Musculoskeletal:     Cervical back: Normal range of motion and neck supple.     Right lower leg: No edema.     Left lower leg: No edema.  Lymphadenopathy:     Cervical: No cervical adenopathy.  Neurological:     General: No focal deficit present.     Mental Status: He is alert. Mental status is at baseline.  Psychiatric:        Mood and Affect: Mood normal.        Thought Content: Thought content normal.     No results found for any visits on 11/16/19.     Assessment & Plan    1. SOB (shortness of breath) I am suspecting symptoms are progression of chronic disease without medications. I am hopeful that restarting his medications will help to improve some of these symptoms. I will also check labs as below and f/u pending results to r/o other sources of fatigue and SOB. May benefit from Home PT in the future for deconditioning if patient becomes agreeable.  - CBC w/Diff/Platelet - Comprehensive Metabolic Panel (CMET) - TSH  2. Chronic fatigue See above medical treatment plan. - CBC w/Diff/Platelet - Comprehensive Metabolic Panel (CMET) - TSH  3. Aortic stenosis with mitral and aortic insufficiency Sounds stable. No fluid overload. Will restart BP medications and advised his Niece that he will need his annual f/u with Dr. Clayborn Bigness in March 2021. She voiced understanding to call and arrange.  - lisinopril-hydrochlorothiazide (ZESTORETIC) 10-12.5 MG tablet; Take 1 tablet by mouth daily.  Dispense: 90 tablet; Refill: 0 - CBC  w/Diff/Platelet - Comprehensive Metabolic Panel (CMET) - TSH  4. Stage 3b chronic kidney disease H/O this. Will check labs as below and f/u pending results. - CBC w/Diff/Platelet - Comprehensive Metabolic Panel (CMET) - TSH  5. Hypothyroidism due to acquired atrophy of thyroid Has been off medication. Restart synthroid as below. Will check labs as below and f/u pending results. - levothyroxine (SYNTHROID) 50 MCG tablet; Take 1 tablet (50 mcg total) by mouth daily.  Dispense: 90 tablet; Refill: 0 - CBC  w/Diff/Platelet - Comprehensive Metabolic Panel (CMET) - TSH  6. Essential hypertension Restart. Will check labs as below and f/u pending results. - lisinopril-hydrochlorothiazide (ZESTORETIC) 10-12.5 MG tablet; Take 1 tablet by mouth daily.  Dispense: 90 tablet; Refill: 0 - CBC w/Diff/Platelet - Comprehensive Metabolic Panel (CMET) - TSH  7. Mixed hyperlipidemia Restart medication. Will check labs as below and f/u pending results. - rosuvastatin (CRESTOR) 20 MG tablet; Take 1 tablet (20 mg total) by mouth daily.  Dispense: 90 tablet; Refill: 0 - CBC w/Diff/Platelet - Comprehensive Metabolic Panel (CMET) - TSH  8. Myasthenia gravis (HCC) Will refill medications as below for MG until patient can return to see Dr. Melrose Nakayama. Due for f/u in March 2021 for annual recheck.  - mycophenolate (CELLCEPT) 250 MG capsule; Take 1 capsule (250 mg total) by mouth See admin instructions. Take 250 mg by mouth once a day for 1 week, then increase to 250 mg two times a day  Dispense: 180 capsule; Refill: 0 - pyridostigmine (MESTINON) 60 MG tablet; Take 1 tablet (60 mg total) by mouth 4 (four) times daily.  Dispense: 360 tablet; Refill: 0 - CBC w/Diff/Platelet - Comprehensive Metabolic Panel (CMET) - TSH     Mar Daring, PA-C  [facility]

## 2019-11-16 NOTE — Patient Instructions (Signed)
Call Dr Melrose Nakayama (neurology) and Dr Clayborn Bigness (cardiology) for appointments in march for one year f/u.

## 2019-11-17 ENCOUNTER — Encounter: Payer: Self-pay | Admitting: Family Medicine

## 2019-11-17 LAB — CBC WITH DIFFERENTIAL/PLATELET
Basophils Absolute: 0 10*3/uL (ref 0.0–0.2)
Basos: 1 %
EOS (ABSOLUTE): 0.1 10*3/uL (ref 0.0–0.4)
Eos: 1 %
Hematocrit: 51.1 % — ABNORMAL HIGH (ref 37.5–51.0)
Hemoglobin: 17 g/dL (ref 13.0–17.7)
Immature Grans (Abs): 0 10*3/uL (ref 0.0–0.1)
Immature Granulocytes: 0 %
Lymphocytes Absolute: 0.9 10*3/uL (ref 0.7–3.1)
Lymphs: 13 %
MCH: 30.8 pg (ref 26.6–33.0)
MCHC: 33.3 g/dL (ref 31.5–35.7)
MCV: 93 fL (ref 79–97)
Monocytes Absolute: 0.6 10*3/uL (ref 0.1–0.9)
Monocytes: 9 %
Neutrophils Absolute: 5.2 10*3/uL (ref 1.4–7.0)
Neutrophils: 76 %
Platelets: 141 10*3/uL — ABNORMAL LOW (ref 150–450)
RBC: 5.52 x10E6/uL (ref 4.14–5.80)
RDW: 12.4 % (ref 11.6–15.4)
WBC: 6.8 10*3/uL (ref 3.4–10.8)

## 2019-11-17 LAB — TSH: TSH: 8.77 u[IU]/mL — ABNORMAL HIGH (ref 0.450–4.500)

## 2019-11-17 LAB — COMPREHENSIVE METABOLIC PANEL
ALT: 14 IU/L (ref 0–44)
AST: 16 IU/L (ref 0–40)
Albumin/Globulin Ratio: 1.5 (ref 1.2–2.2)
Albumin: 3.9 g/dL (ref 3.6–4.6)
Alkaline Phosphatase: 73 IU/L (ref 39–117)
BUN/Creatinine Ratio: 16 (ref 10–24)
BUN: 32 mg/dL — ABNORMAL HIGH (ref 8–27)
Bilirubin Total: 0.8 mg/dL (ref 0.0–1.2)
CO2: 19 mmol/L — ABNORMAL LOW (ref 20–29)
Calcium: 10.3 mg/dL — ABNORMAL HIGH (ref 8.6–10.2)
Chloride: 106 mmol/L (ref 96–106)
Creatinine, Ser: 2 mg/dL — ABNORMAL HIGH (ref 0.76–1.27)
GFR calc Af Amer: 33 mL/min/{1.73_m2} — ABNORMAL LOW (ref >59)
GFR calc non Af Amer: 29 mL/min/{1.73_m2} — ABNORMAL LOW (ref >59)
Globulin, Total: 2.6 g/dL (ref 1.5–4.5)
Glucose: 87 mg/dL (ref 65–99)
Potassium: 4.7 mmol/L (ref 3.5–5.2)
Sodium: 144 mmol/L (ref 134–144)
Total Protein: 6.5 g/dL (ref 6.0–8.5)

## 2019-11-18 ENCOUNTER — Telehealth: Payer: Self-pay

## 2019-11-18 NOTE — Telephone Encounter (Signed)
     Comments to Patient Seen by patient Jason Riley on 11/18/2019 2:10 PM EST

## 2019-11-18 NOTE — Telephone Encounter (Signed)
-----   Message from Mar Daring, [place] sent at 11/18/2019  1:58 PM EST ----- Blood count is normal and stable. The lower platelet count is normal and secondary to him taking aspirin. His kidney function has declined compared to one year ago. Hopefully with restarting his medications and increasing fluids will help. Thyroid does show to be underactive. I feel this is also from him not taking his levothyroxine. Hopefully with restarting medications, getting everything back on track, his fatigue and SOB will continue to improve. All these things can play a factor in his decline. I am hopeful over the next 6-8 weeks with getting back on his daily medications will help him bounce back some.

## 2019-11-23 DIAGNOSIS — G7000 Myasthenia gravis without (acute) exacerbation: Secondary | ICD-10-CM | POA: Diagnosis not present

## 2019-11-24 ENCOUNTER — Encounter (HOSPITAL_COMMUNITY): Payer: Self-pay | Admitting: Internal Medicine

## 2019-11-24 ENCOUNTER — Inpatient Hospital Stay (HOSPITAL_COMMUNITY): Payer: Medicare Other

## 2019-11-24 ENCOUNTER — Inpatient Hospital Stay (HOSPITAL_COMMUNITY)
Admission: AD | Admit: 2019-11-24 | Discharge: 2019-12-04 | DRG: 056 | Disposition: A | Discharge status: Home Health | Payer: Medicare Other | Source: Ambulatory Visit | Attending: Internal Medicine | Admitting: Internal Medicine

## 2019-11-24 DIAGNOSIS — F17220 Nicotine dependence, chewing tobacco, uncomplicated: Secondary | ICD-10-CM | POA: Diagnosis present

## 2019-11-24 DIAGNOSIS — Z452 Encounter for adjustment and management of vascular access device: Secondary | ICD-10-CM | POA: Diagnosis not present

## 2019-11-24 DIAGNOSIS — I350 Nonrheumatic aortic (valve) stenosis: Secondary | ICD-10-CM | POA: Diagnosis not present

## 2019-11-24 DIAGNOSIS — Z7982 Long term (current) use of aspirin: Secondary | ICD-10-CM | POA: Diagnosis not present

## 2019-11-24 DIAGNOSIS — E43 Unspecified severe protein-calorie malnutrition: Secondary | ICD-10-CM | POA: Diagnosis not present

## 2019-11-24 DIAGNOSIS — Z888 Allergy status to other drugs, medicaments and biological substances status: Secondary | ICD-10-CM

## 2019-11-24 DIAGNOSIS — R1311 Dysphagia, oral phase: Secondary | ICD-10-CM

## 2019-11-24 DIAGNOSIS — G7001 Myasthenia gravis with (acute) exacerbation: Principal | ICD-10-CM | POA: Diagnosis present

## 2019-11-24 DIAGNOSIS — Z681 Body mass index (BMI) 19 or less, adult: Secondary | ICD-10-CM

## 2019-11-24 DIAGNOSIS — Z8546 Personal history of malignant neoplasm of prostate: Secondary | ICD-10-CM

## 2019-11-24 DIAGNOSIS — I2510 Atherosclerotic heart disease of native coronary artery without angina pectoris: Secondary | ICD-10-CM | POA: Diagnosis present

## 2019-11-24 DIAGNOSIS — Z20822 Contact with and (suspected) exposure to covid-19: Secondary | ICD-10-CM | POA: Diagnosis not present

## 2019-11-24 DIAGNOSIS — T82594A Other mechanical complication of infusion catheter, initial encounter: Secondary | ICD-10-CM

## 2019-11-24 DIAGNOSIS — I083 Combined rheumatic disorders of mitral, aortic and tricuspid valves: Secondary | ICD-10-CM | POA: Diagnosis present

## 2019-11-24 DIAGNOSIS — Z79890 Hormone replacement therapy (postmenopausal): Secondary | ICD-10-CM

## 2019-11-24 DIAGNOSIS — D696 Thrombocytopenia, unspecified: Secondary | ICD-10-CM | POA: Diagnosis present

## 2019-11-24 DIAGNOSIS — Z882 Allergy status to sulfonamides status: Secondary | ICD-10-CM

## 2019-11-24 DIAGNOSIS — E8809 Other disorders of plasma-protein metabolism, not elsewhere classified: Secondary | ICD-10-CM | POA: Diagnosis not present

## 2019-11-24 DIAGNOSIS — E039 Hypothyroidism, unspecified: Secondary | ICD-10-CM | POA: Diagnosis not present

## 2019-11-24 DIAGNOSIS — I129 Hypertensive chronic kidney disease with stage 1 through stage 4 chronic kidney disease, or unspecified chronic kidney disease: Secondary | ICD-10-CM | POA: Diagnosis not present

## 2019-11-24 DIAGNOSIS — R1319 Other dysphagia: Secondary | ICD-10-CM

## 2019-11-24 DIAGNOSIS — Z9079 Acquired absence of other genital organ(s): Secondary | ICD-10-CM

## 2019-11-24 DIAGNOSIS — R1310 Dysphagia, unspecified: Secondary | ICD-10-CM | POA: Diagnosis not present

## 2019-11-24 DIAGNOSIS — Z8619 Personal history of other infectious and parasitic diseases: Secondary | ICD-10-CM | POA: Diagnosis not present

## 2019-11-24 DIAGNOSIS — Z4901 Encounter for fitting and adjustment of extracorporeal dialysis catheter: Secondary | ICD-10-CM | POA: Diagnosis not present

## 2019-11-24 DIAGNOSIS — R1314 Dysphagia, pharyngoesophageal phase: Secondary | ICD-10-CM | POA: Diagnosis not present

## 2019-11-24 DIAGNOSIS — G7000 Myasthenia gravis without (acute) exacerbation: Secondary | ICD-10-CM

## 2019-11-24 DIAGNOSIS — I10 Essential (primary) hypertension: Secondary | ICD-10-CM

## 2019-11-24 DIAGNOSIS — E785 Hyperlipidemia, unspecified: Secondary | ICD-10-CM | POA: Diagnosis not present

## 2019-11-24 DIAGNOSIS — R531 Weakness: Secondary | ICD-10-CM | POA: Diagnosis not present

## 2019-11-24 DIAGNOSIS — H9113 Presbycusis, bilateral: Secondary | ICD-10-CM | POA: Diagnosis present

## 2019-11-24 DIAGNOSIS — N1832 Chronic kidney disease, stage 3b: Secondary | ICD-10-CM | POA: Diagnosis not present

## 2019-11-24 DIAGNOSIS — E034 Atrophy of thyroid (acquired): Secondary | ICD-10-CM | POA: Diagnosis not present

## 2019-11-24 DIAGNOSIS — Z79899 Other long term (current) drug therapy: Secondary | ICD-10-CM | POA: Diagnosis not present

## 2019-11-24 DIAGNOSIS — N183 Chronic kidney disease, stage 3 unspecified: Secondary | ICD-10-CM | POA: Diagnosis present

## 2019-11-24 DIAGNOSIS — R918 Other nonspecific abnormal finding of lung field: Secondary | ICD-10-CM | POA: Diagnosis not present

## 2019-11-24 DIAGNOSIS — Z9114 Patient's other noncompliance with medication regimen: Secondary | ICD-10-CM | POA: Diagnosis not present

## 2019-11-24 HISTORY — DX: Myasthenia gravis without (acute) exacerbation: G70.00

## 2019-11-24 HISTORY — DX: Nonrheumatic aortic (valve) stenosis: I35.0

## 2019-11-24 HISTORY — DX: Hypothyroidism, unspecified: E03.9

## 2019-11-24 LAB — COMPREHENSIVE METABOLIC PANEL
ALT: 15 U/L (ref 0–44)
AST: 21 U/L (ref 15–41)
Albumin: 2.9 g/dL — ABNORMAL LOW (ref 3.5–5.0)
Alkaline Phosphatase: 55 U/L (ref 38–126)
Anion gap: 10 (ref 5–15)
BUN: 32 mg/dL — ABNORMAL HIGH (ref 8–23)
CO2: 26 mmol/L (ref 22–32)
Calcium: 9.7 mg/dL (ref 8.9–10.3)
Chloride: 103 mmol/L (ref 98–111)
Creatinine, Ser: 2.09 mg/dL — ABNORMAL HIGH (ref 0.61–1.24)
GFR calc Af Amer: 32 mL/min — ABNORMAL LOW (ref >60)
GFR calc non Af Amer: 27 mL/min — ABNORMAL LOW (ref >60)
Glucose, Bld: 158 mg/dL — ABNORMAL HIGH (ref 70–99)
Potassium: 3.8 mmol/L (ref 3.5–5.1)
Sodium: 139 mmol/L (ref 135–145)
Total Bilirubin: 1 mg/dL (ref 0.3–1.2)
Total Protein: 5.5 g/dL — ABNORMAL LOW (ref 6.5–8.1)

## 2019-11-24 LAB — TSH: TSH: 2.975 u[IU]/mL (ref 0.350–4.500)

## 2019-11-24 LAB — SAR COV2 SEROLOGY (COVID19)AB(IGG),IA: SARS-CoV-2 Ab, IgG: NONREACTIVE

## 2019-11-24 LAB — SARS CORONAVIRUS 2 (TAT 6-24 HRS): SARS Coronavirus 2: NEGATIVE

## 2019-11-24 MED ORDER — ASPIRIN EC 81 MG PO TBEC
81.0000 mg | DELAYED_RELEASE_TABLET | Freq: Every day | ORAL | Status: DC
  Administered 2019-11-25 – 2019-12-04 (×10): 81 mg via ORAL
  Filled 2019-11-24 (×10): qty 1

## 2019-11-24 MED ORDER — LISINOPRIL 10 MG PO TABS
10.0000 mg | ORAL_TABLET | Freq: Every day | ORAL | Status: DC
  Filled 2019-11-24: qty 1

## 2019-11-24 MED ORDER — PYRIDOSTIGMINE BROMIDE 60 MG PO TABS: 60.0000 mg | ORAL_TABLET | Freq: Four times a day (QID) | ORAL | Status: DC

## 2019-11-24 MED ORDER — LISINOPRIL-HYDROCHLOROTHIAZIDE 10-12.5 MG PO TABS: 1.0000 | ORAL_TABLET | Freq: Every day | ORAL | Status: DC

## 2019-11-24 MED ORDER — HEPARIN SODIUM (PORCINE) 5000 UNIT/ML IJ SOLN
5000.0000 [IU] | Freq: Two times a day (BID) | INTRAMUSCULAR | Status: DC
  Administered 2019-11-24 – 2019-12-04 (×19): 5000 [IU] via SUBCUTANEOUS
  Filled 2019-11-24 (×21): qty 1

## 2019-11-24 MED ORDER — ASPIRIN 81 MG PO TBEC: 81.0000 mg | DELAYED_RELEASE_TABLET | Freq: Every day | ORAL | Status: DC

## 2019-11-24 MED ORDER — ENSURE ENLIVE PO LIQD
237.0000 mL | Freq: Two times a day (BID) | ORAL | Status: DC
  Administered 2019-11-24 – 2019-11-26 (×4): 237 mL via ORAL

## 2019-11-24 MED ORDER — LEVOTHYROXINE SODIUM 50 MCG PO TABS
50.0000 ug | ORAL_TABLET | Freq: Every day | ORAL | Status: DC
  Administered 2019-11-25 – 2019-12-04 (×10): 50 ug via ORAL
  Filled 2019-11-24 (×10): qty 1

## 2019-11-24 MED ORDER — ROSUVASTATIN CALCIUM 20 MG PO TABS
20.0000 mg | ORAL_TABLET | Freq: Every day | ORAL | Status: DC
  Administered 2019-11-25 – 2019-12-04 (×10): 20 mg via ORAL
  Filled 2019-11-24 (×10): qty 1

## 2019-11-24 MED ORDER — MYCOPHENOLATE MOFETIL 250 MG PO CAPS
250.0000 mg | ORAL_CAPSULE | Freq: Two times a day (BID) | ORAL | Status: DC
  Administered 2019-11-24 – 2019-12-04 (×20): 250 mg via ORAL
  Filled 2019-11-24 (×22): qty 1

## 2019-11-24 MED ORDER — HYDROCHLOROTHIAZIDE 12.5 MG PO CAPS
12.5000 mg | ORAL_CAPSULE | Freq: Every day | ORAL | Status: DC
  Administered 2019-11-25 – 2019-12-02 (×8): 12.5 mg via ORAL
  Filled 2019-11-24 (×9): qty 1

## 2019-11-24 NOTE — Consult Note (Signed)
Neurology Consultation  Reason for Consult: Myasthenia gravis exacerbation  Referring Physician: Lequita Halt, MD  CC: Myasthenia gravis exacerbation with generalized weakness and trouble swallowing  History is obtained from: Patient  HPI: Jason Riley is a 86 y.o. male with history of myasthenia gravis, prostate cancer and measles.  Patient was brought to the hospital secondary to increasing generalized weakness, trouble swallowing and weight loss.  Patient apparently lives by himself in a retirement community.  Per patient's niece he stopped taking his medications for at least 4 to 5 months due to trouble refilling his meds via phone.  Initially was stated that he was having difficulty swallowing solid foods however then at that point he had difficulty swallowing liquids and only can tolerate small sips at this point in time.  Over the last 3 months he has lost 30 pounds.  Due to patient's worsening weakness in the setting of known myasthenia gravis and patient not receiving his medications, Neurology was asked to evaluate the patient.  In talking to the niece who was in the room, she states that she will be taking over the ordering of his medications from now on.  Patient states that he is able to take small sips of water; however, if he drinks a large amount of water he does choke on the liquid.  Chart review: Patient was last seen in 2019.  At that time he was diagnosed with myasthenia gravis.  His symptoms then were double vision, shortness of breath on exertion and worsening dysphagia.  At that time he was supposed to start CellCept. He may not have filled this prescription, however. At the time of the prior admission, the patient completed 5 rounds of plasma exchange.  He continued his Mestinon.  He was to follow-up with his outpatient Neurologist for resumption of CellCept  Patient had an acetylcholine blocking antibody of 41 and an acetylcholine binding antibody of 33.7 on previous  labs.   Past Medical History:  Diagnosis Date  . CN (constipation)   . Elevated transaminase level   . History of measles   . Myasthenia gravis ([place])   . Prostate cancer [facility])    Prostatectomy 2001    Family History  Problem Relation Age of Onset  . Heart disease Mother   . Colon cancer Father   . Obesity Sister    Social History:   reports that he has quit smoking. His smoking use included cigars. He quit after 15.00 years of use. His smokeless tobacco use includes chew. He reports that he does not drink alcohol or use drugs.  Medications  Current Facility-Administered Medications:  .  aspirin EC tablet 81 mg, 81 mg, Oral, Daily, Zhang, Ping T, MD .  feeding supplement (ENSURE ENLIVE) (ENSURE ENLIVE) liquid 237 mL, 237 mL, Oral, BID BM, Wynetta Fines T, MD .  heparin injection 5,000 Units, 5,000 Units, Subcutaneous, Q12H, Zhang, Ping T, MD .  levothyroxine (SYNTHROID) tablet 50 mcg, 50 mcg, Oral, Daily, Zhang, Ping T, MD .  lisinopril-hydrochlorothiazide (ZESTORETIC) 10-12.5 MG per tablet 1 tablet, 1 tablet, Oral, Daily, Wynetta Fines T, MD .  mycophenolate (CELLCEPT) capsule 250 mg, 250 mg, Oral, See admin instructions, Wynetta Fines T, MD .  rosuvastatin (CRESTOR) tablet 20 mg, 20 mg, Oral, Daily, Zhang, Ping T, MD  ROS:    General ROS: Weight loss for - weight loss Psychological ROS: negative for - behavioral disorder, hallucinations, memory difficulties, mood swings or suicidal ideation Ophthalmic ROS: negative for - blurry vision, double  vision, eye pain or loss of vision ENT ROS: negative for - epistaxis, nasal discharge, oral lesions, sore throat, tinnitus or vertigo Allergy and Immunology ROS: negative for - hives or itchy/watery eyes Hematological and Lymphatic ROS: negative for - bleeding problems, bruising or swollen lymph nodes Endocrine ROS: negative for - galactorrhea, hair pattern changes, polydipsia/polyuria or temperature intolerance Respiratory ROS: negative for  - cough, hemoptysis, shortness of breath or wheezing Cardiovascular ROS: negative for - chest pain, dyspnea on exertion, edema or irregular heartbeat Gastrointestinal ROS: Positive for -  diarrhea, Genito-Urinary ROS: negative for - dysuria, hematuria, incontinence or urinary frequency/urgency Musculoskeletal ROS: Positive for -muscular weakness Neurological ROS: as noted in HPI Dermatological ROS: negative for rash and skin lesion changes  Exam: Current vital signs: BP (!) 135/43 (BP Location: Left Arm)   Pulse 64   Temp 97.6 F (36.4 C) (Oral)   Resp 16   Ht 5\' 9"  (1.753 m)   SpO2 98%   BMI 19.67 kg/m  Vital signs in last 24 hours: Temp:  [97.6 F (36.4 C)-99 F (37.2 C)] 97.6 F (36.4 C) (02/02 1555) Pulse Rate:  [64-70] 64 (02/02 1555) Resp:  [16] 16 (02/02 1555) BP: (131-135)/(43-52) 135/43 (02/02 1555) SpO2:  [98 %-100 %] 98 % (02/02 1555)   Constitutional: Cachectic Psych: Affect appropriate to situation Eyes: No scleral injection HENT: No OP obstrucion Head: Normocephalic.  Cardiovascular: Normal rate and regular rhythm.  Respiratory: Effort normal, non-labored breathing GI: Soft.  No distension. There is no tenderness.  Skin: Dry  Neuro: Mental Status: Patient is awake, alert, oriented to person, place, month, year, Speech - Naming, repeating and comprehension are intact.  Cranial Nerves: II: Visual Fields are full.  III,IV, VI: EOMI without ptosis or diplopia. Pupils equal, round and reactive to light V: Facial sensation is symmetric to temperature VII: Facial movement is symmetric. No transverse smile noted.  VIII: Decreased hearing acuity, uses hearing aids X: Palate elevates symmetrically XI: Shoulder shrug is symmetric. XII: tongue is midline without atrophy or fasciculations.  Motor: Decreased bulk. 4+/5 strength was present in all four extremities.  Sensory: Sensation is symmetric to light touch and temperature in the arms and legs. Deep Tendon  Reflexes: 2+ and symmetric in the biceps and patellae.  Plantars: Toes are downgoing bilaterally.   Labs I have reviewed labs in epic and the results pertinent to this consultation are:  CBC    Component Value Date/Time   WBC 6.8 11/16/2019 1220   WBC 8.0 05/10/2018 1754   RBC 5.52 11/16/2019 1220   RBC 3.29 (L) 05/10/2018 1754   HGB 17.0 11/16/2019 1220   HCT 51.1 (H) 11/16/2019 1220   PLT 141 (L) 11/16/2019 1220   MCV 93 11/16/2019 1220   MCH 30.8 11/16/2019 1220   MCH 30.7 05/10/2018 1754   MCHC 33.3 11/16/2019 1220   MCHC 31.9 05/10/2018 1754   RDW 12.4 11/16/2019 1220   LYMPHSABS 0.9 11/16/2019 1220   MONOABS 0.7 05/10/2018 1754   EOSABS 0.1 11/16/2019 1220   BASOSABS 0.0 11/16/2019 1220    CMP     Component Value Date/Time   NA 144 11/16/2019 1220   K 4.7 11/16/2019 1220   CL 106 11/16/2019 1220   CO2 19 (L) 11/16/2019 1220   GLUCOSE 87 11/16/2019 1220   GLUCOSE 115 (H) 05/10/2018 1754   BUN 32 (H) 11/16/2019 1220   CREATININE 2.00 (H) 11/16/2019 1220   CALCIUM 10.3 (H) 11/16/2019 1220   PROT 6.5 11/16/2019 1220  ALBUMIN 3.9 11/16/2019 1220   AST 16 11/16/2019 1220   ALT 14 11/16/2019 1220   ALKPHOS 73 11/16/2019 1220   BILITOT 0.8 11/16/2019 1220   GFRNONAA 29 (L) 11/16/2019 1220   GFRAA 33 (L) 11/16/2019 1220    Lipid Panel     Component Value Date/Time   CHOL 140 11/26/2017 0817   TRIG 129 11/26/2017 0817   HDL 46 11/26/2017 0817   CHOLHDL 3.0 11/26/2017 0817   LDLCALC 68 11/26/2017 0817     Imaging None  Etta Quill PA-C [facility] [phone] 11/24/2019, 5:34 PM     Assessment:  86 year old male with known myasthenia gravis who has not taken his medications for the last 4 to 5 months. Now presenting with generalized weakness and difficulty swallowing, along with significant weight loss of 30 pounds.   1. Overall presentation most consistent with myasthenia gravis exacerbation.   2. History of prostate cancer with  prostatectomy in 2001.  Recommendations: -Place NG tube for medications until patient can swallow -Plasma exchange for 5 total days, with treatment every other day -Hold off on resuming Mestinon as restarting this medication may make it difficult to determine if plasma exchange has been efficacious. He also had diarrhea at home after recently restarting this medication. When restarting, would do so at a low dose and keep in house for one day to assess for possible cholinergic side effects.  --Patient will need to resume CellCept at discharge, but this should be done under the direction of his outpatient Neurologist.  --Speech therapy for swallow screen -Respiratory therapy consult for FVC and NIF BID during waking hours.  --PT/OT --Avoid medications known to worsen NMJ dysfunction in myasthenia gravis  I have seen and examined the patient. I have formulated the assessment and recommendations. 86 year old male presenting with myasthenia gravis exacerbation. Exam reveals mild diffuse weakness. Recommendations as above.  Electronically signed: Dr. Kerney Elbe

## 2019-11-24 NOTE — Evaluation (Signed)
Physical Therapy Evaluation Patient Details Name: Jason Riley MRN: [id number] DOB: 12-05-1928 Today's Date: 11/24/2019   History of Present Illness  Pt is an 86 y/o male admittes secondary to increased weakness, trouble swallowing and weight loss. Thought to be secondary to myasthenia gravis flare. PMH includes myasthenia gravis, aortic stenosis, HTN, and prostate cancer.   Clinical Impression  Pt admitted secondary to problem above with deficits below. Pt requiring min guard for gait within the room. Was able to take 3 laps within the room before becoming fatigued. Pt reports the plan is to return home to retirement center. Feel pt would benefit from [facility] services at d/c to increase safety at home. Will continue to follow acutely to maximize functional mobility independence and safety.     Follow Up Recommendations Home health PT;Supervision for mobility/OOB(HHPT if appropriate supervision can be provided)    Equipment Recommendations  Other (comment)(4 wheeled walker with seat (rollator))    Recommendations for Other Services       Precautions / Restrictions Precautions Precautions: Fall Restrictions Weight Bearing Restrictions: No      Mobility  Bed Mobility Overal bed mobility: Needs Assistance Bed Mobility: Supine to Sit;Sit to Supine     Supine to sit: Supervision Sit to supine: Supervision   General bed mobility comments: Supervision for safety   Transfers Overall transfer level: Needs assistance Equipment used: None Transfers: Sit to/from Stand Sit to Stand: Min guard         General transfer comment: Min guard for safety.   Ambulation/Gait Ambulation/Gait assistance: Min guard Gait Distance (Feet): 50 Feet Assistive device: None Gait Pattern/deviations: Step-through pattern;Decreased stride length Gait velocity: Decreased   General Gait Details: Slow, cautious gait. Mild unsteadiness noted, however, no overt LOB. Distance limited secondary to fatigue.    Stairs            Wheelchair Mobility    Modified Rankin (Stroke Patients Only)       Balance Overall balance assessment: Needs assistance Sitting-balance support: No upper extremity supported;Feet supported Sitting balance-Leahy Scale: Good     Standing balance support: No upper extremity supported;During functional activity Standing balance-Leahy Scale: Fair                               Pertinent Vitals/Pain Pain Assessment: No/denies pain    Home Living Family/patient expects to be discharged to:: Private residence Living Arrangements: Alone Available Help at Discharge: Family;Available PRN/intermittently Type of Home: Other(Comment)(retirement community) Home Access: Elevator;Level entry     Home Layout: One level Home Equipment: Walker - 2 wheels;Grab bars - tub/shower      Prior Function Level of Independence: Independent               Hand Dominance        Extremity/Trunk Assessment   Upper Extremity Assessment Upper Extremity Assessment: Defer to OT evaluation    Lower Extremity Assessment Lower Extremity Assessment: Generalized weakness    Cervical / Trunk Assessment Cervical / Trunk Assessment: Kyphotic  Communication   Communication: HOH  Cognition Arousal/Alertness: Awake/alert Behavior During Therapy: WFL for tasks assessed/performed Overall Cognitive Status: Within Functional Limits for tasks assessed                                        General Comments General comments (skin integrity, edema, etc.): Pt's niece  present during session.     Exercises     Assessment/Plan    PT Assessment Patient needs continued PT services  PT Problem List Decreased balance;Decreased strength;Decreased activity tolerance;Decreased mobility;Decreased knowledge of use of DME       PT Treatment Interventions Gait training;DME instruction;Functional mobility training;Therapeutic activities;Balance  training;Therapeutic exercise;Patient/family education    PT Goals (Current goals can be found in the Care Plan section)  Acute Rehab PT Goals Patient Stated Goal: to be able to eat PT Goal Formulation: With patient Time For Goal Achievement: 12/08/19 Potential to Achieve Goals: Good    Frequency Min 3X/week   Barriers to discharge Decreased caregiver support      Co-evaluation               AM-PAC PT "6 Clicks" Mobility  Outcome Measure Help needed turning from your back to your side while in a flat bed without using bedrails?: None Help needed moving from lying on your back to sitting on the side of a flat bed without using bedrails?: None Help needed moving to and from a bed to a chair (including a wheelchair)?: A Little Help needed standing up from a chair using your arms (e.g., wheelchair or bedside chair)?: A Little Help needed to walk in hospital room?: A Little Help needed climbing 3-5 steps with a railing? : A Lot 6 Click Score: 19    End of Session Equipment Utilized During Treatment: Gait belt Activity Tolerance: Patient limited by fatigue Patient left: in bed;with call bell/phone within reach;with bed alarm set;with family/visitor present Nurse Communication: Mobility status PT Visit Diagnosis: Other abnormalities of gait and mobility (R26.89);Muscle weakness (generalized) (M62.81)    Time: [id number] PT Time Calculation (min) (ACUTE ONLY): 20 min   Charges:   PT Evaluation $PT Eval Low Complexity: 1 Low          Lou Miner, DPT  Acute Rehabilitation Services  Pager: [phone] Office: [phone]   Rudean Hitt 11/24/2019, 5:04 PM

## 2019-11-24 NOTE — Progress Notes (Signed)
Patient admitted to 3w-09

## 2019-11-24 NOTE — H&P (Addendum)
History and Physical    Jason Riley [id number] DOB: 09/03/1929 DOA: 11/24/2019  PCP: Jason Sons, MD   Patient coming from: [facility] Living  I have personally briefly reviewed patient's old medical records in [place]  Chief Complaint: Generalized weakness  HPI: Jason Riley is a 86 y.o. male with medical history significant of myasthenia gravis, severe aortic stenosis hypertension, hypothyroidism presented with increasing generalized weakness, trouble swallowing, weight loss.  Patient lives by himself in retired community, with assisted living.  He stopped taking all his medications for the last 4 to 5 months, according to patient's niece who presented at bedside, patient had hard hearing, and has has trouble to refill all his meds via phone.  Patient denied any depression, or intention to stop his meds. According to pharmacy's note :"Jason Riley answer (phone) but was not able to communicate patient is past due on Rosuvastatin 20 mg and Lisinopril/Hctz 10/12.5 mg."   He started to have increasing generalized weakness for last 3 months, associated with trouble swallowing, initially solid food, then he reduced to liquid diet, now he can only tolerate small sips, and he has lost about 30 pounds over the last 3 months.  He denies any choking, no cough.  Patient is also reported patient feel extremely weak walking within the room, but denies any significant short of breath.  Denied any double vision or blurry vision.  10 days ago patient refilled all his medications and started to take them again, but he denied any significant improvement or his symptoms including generalized weakness or difficulty swallowing. ED Course: Patient directly admitted from neurologist office  Review of Systems: As per HPI otherwise 10 point review of systems negative.    Past Medical History:  Diagnosis Date  . CN (constipation)   . Elevated transaminase level   . History of measles   . Prostate cancer  [facility])    Prostatectomy 2001    Past Surgical History:  Procedure Laterality Date  . APPENDECTOMY  1955  . HERNIA REPAIR Right 2006   Dr. Jamal Riley  . IR FLUORO GUIDE CV LINE LEFT  05/03/2018  . IR US GUIDE VASC ACCESS RIGHT  05/03/2018  . PROSTATECTOMY  2001   Dr. Yves Riley     reports that he has quit smoking. His smoking use included cigars. He quit after 15.00 years of use. His smokeless tobacco use includes chew. He reports that he does not drink alcohol or use drugs.  Allergies  Allergen Reactions  . Atorvastatin Other (See Comments)    Elevated liver functions ("liver disorder")  . Sulfa Antibiotics Rash    Family History  Problem Relation Age of Onset  . Heart disease Mother   . Colon cancer Father   . Obesity Sister      Prior to Admission medications   Medication Sig Start Date End Date Taking? Authorizing Provider  aspirin (ASPIRIN LOW DOSE) 81 MG EC tablet Take 81 mg by mouth daily.     [provider]  levothyroxine (SYNTHROID) 50 MCG tablet Take 1 tablet (50 mcg total) by mouth daily. 11/16/19   Jason Daring, Jason Riley  lisinopril-hydrochlorothiazide (ZESTORETIC) 10-12.5 MG tablet Take 1 tablet by mouth daily. 11/16/19   Jason Daring, Jason Riley  mycophenolate (CELLCEPT) 250 MG capsule Take 1 capsule (250 mg total) by mouth See admin instructions. Take 250 mg by mouth once a day for 1 week, then increase to 250 mg two times a day 11/16/19   Jason Riley  Riley, Jason Riley  pyridostigmine (MESTINON) 60 MG tablet Take 1 tablet (60 mg total) by mouth 4 (four) times daily. 11/16/19   Jason Daring, Jason Riley  rosuvastatin (CRESTOR) 20 MG tablet Take 1 tablet (20 mg total) by mouth daily. 11/16/19   Jason Daring, Jason Riley  UNABLE TO FIND "Ice Breakers mints": Dissolve 1 mint in the mouth as needed for dry throat    [provider]    Physical Exam: Vitals:   11/24/19 1425 11/24/19 1428  BP: (!) 131/52   Pulse: 70   Resp: 16   Temp: 98.1 F (36.7 C)  99 F (37.2 C)  TempSrc: Oral   SpO2: 100%   Height:  5\' 9"  (1.753 Riley)    Constitutional: NAD, calm, comfortable Vitals:   11/24/19 1425 11/24/19 1428  BP: (!) 131/52   Pulse: 70   Resp: 16   Temp: 98.1 F (36.7 C) 99 F (37.2 C)  TempSrc: Oral   SpO2: 100%   Height:  5\' 9"  (1.753 Riley)   Eyes: PERRL, lids and conjunctivae normal ENMT: Mucous membranes are moist. Posterior pharynx clear of any exudate or lesions.Normal dentition.  Neck: normal, supple, no masses, no thyromegaly Respiratory: clear to auscultation bilaterally, no wheezing, no crackles. Normal respiratory effort. No accessory muscle use.  Cardiovascular: Regular rate and rhythm, loud blowing like systolic murmur on heart base. No extremity edema. 2+ pedal pulses. No carotid bruits.  Abdomen: no tenderness, no masses palpated. No hepatosplenomegaly. Bowel sounds positive.  Musculoskeletal: no clubbing / cyanosis. No joint deformity upper and lower extremities. Good ROM, no contractures. Normal muscle tone.  Skin: no rashes, lesions, ulcers. No induration Neurologic: CN 2-12 grossly intact. Sensation intact, DTR normal. Strength 4/5 in all 4.  Psychiatric: Normal judgment and insight. Alert and oriented x 3. Normal mood.     Labs on Admission: I have personally reviewed following labs and imaging studies  CBC: No results for input(s): WBC, NEUTROABS, HGB, HCT, MCV, PLT in the last 168 hours. Basic Metabolic Panel: No results for input(s): NA, K, CL, CO2, GLUCOSE, BUN, CREATININE, CALCIUM, MG, PHOS in the last 168 hours. GFR: Estimated Creatinine Clearance: 21.8 mL/min (A) (by C-G formula based on SCr of 2 mg/dL (H)). Liver Function Tests: No results for input(s): AST, ALT, ALKPHOS, BILITOT, PROT, ALBUMIN in the last 168 hours. No results for input(s): LIPASE, AMYLASE in the last 168 hours. No results for input(s): AMMONIA in the last 168 hours. Coagulation Profile: No results for input(s): INR, PROTIME in the  last 168 hours. Cardiac Enzymes: No results for input(s): CKTOTAL, CKMB, CKMBINDEX, TROPONINI in the last 168 hours. BNP (last 3 results) No results for input(s): PROBNP in the last 8760 hours. HbA1C: No results for input(s): HGBA1C in the last 72 hours. CBG: No results for input(s): GLUCAP in the last 168 hours. Lipid Profile: No results for input(s): CHOL, HDL, LDLCALC, TRIG, CHOLHDL, LDLDIRECT in the last 72 hours. Thyroid Function Tests: No results for input(s): TSH, T4TOTAL, FREET4, T3FREE, THYROIDAB in the last 72 hours. Anemia Panel: No results for input(s): VITAMINB12, FOLATE, FERRITIN, TIBC, IRON, RETICCTPCT in the last 72 hours. Urine analysis:    Component Value Date/Time   COLORURINE YELLOW 05/02/2018 1750   APPEARANCEUR HAZY (A) 05/02/2018 1750   LABSPEC 1.018 05/02/2018 1750   PHURINE 5.0 05/02/2018 1750   GLUCOSEU NEGATIVE 05/02/2018 1750   HGBUR NEGATIVE 05/02/2018 1750   BILIRUBINUR NEGATIVE 05/02/2018 1750   KETONESUR NEGATIVE 05/02/2018 1750   PROTEINUR NEGATIVE  05/02/2018 1750   NITRITE NEGATIVE 05/02/2018 1750   LEUKOCYTESUR NEGATIVE 05/02/2018 1750    Radiological Exams on Admission: No results found.  EKG: Ordered  Assessment/Plan Active Problems:   Myasthenia gravis ([place])  Myasthenia gravis exacerbation Neurology Dr. Cheral Marker aware, plan to start plasma exchange for 4 to 5 days. Continue his home medications.  Dysphagia Likley related to the worsening MG from not coherent with his meds. But other etiology may need to be ruled out, such as severe aortic stenosis can also possibly cause dysphagia. Order barium swallow Speech evaluation Liquid diet plus Ensure supplements for now  Generalized weakness Likely related to MG exacerbation, again other etiologies should be considered such as severe aortic stenosis, ordered a echocardiogram, recent echocardiogram in 2019 showed severe aortic stenosis with an area less than 1 cm, gradient 92. PT  evaluation  Severe aortic stenosis, as above. Monitor patient volume status.  Severe protein calorie malnutrition From dysphagia, as above.  HTN, continue home meds.  Hypothyroidism, on Synthroid    DVT prophylaxis: Heparin subcu Code Status: Full code Family Communication: Patient niece at bedside, all questions answered at my best knowledge Disposition Plan: Probably will need rehab, ordered physical therapy evaluation Consults called: Neurology Dr. Cheral Marker Admission status: Telemetry admission   Lequita Halt MD [facility] Pager [phone]    11/24/2019, 3:32 PM

## 2019-11-25 ENCOUNTER — Inpatient Hospital Stay (HOSPITAL_COMMUNITY): Payer: Medicare Other

## 2019-11-25 ENCOUNTER — Other Ambulatory Visit: Payer: Self-pay

## 2019-11-25 ENCOUNTER — Encounter (HOSPITAL_COMMUNITY): Payer: Self-pay | Admitting: Internal Medicine

## 2019-11-25 DIAGNOSIS — I350 Nonrheumatic aortic (valve) stenosis: Secondary | ICD-10-CM

## 2019-11-25 DIAGNOSIS — R531 Weakness: Secondary | ICD-10-CM

## 2019-11-25 HISTORY — PX: IR FLUORO GUIDE CV LINE RIGHT: IMG2283

## 2019-11-25 HISTORY — PX: IR US GUIDE VASC ACCESS RIGHT: IMG2390

## 2019-11-25 LAB — CBC WITH DIFFERENTIAL/PLATELET
Abs Immature Granulocytes: 0.01 10*3/uL (ref 0.00–0.07)
Basophils Absolute: 0 10*3/uL (ref 0.0–0.1)
Basophils Relative: 1 %
Eosinophils Absolute: 0.1 10*3/uL (ref 0.0–0.5)
Eosinophils Relative: 2 %
HCT: 43.8 % (ref 39.0–52.0)
Hemoglobin: 14.6 g/dL (ref 13.0–17.0)
Immature Granulocytes: 0 %
Lymphocytes Relative: 19 %
Lymphs Abs: 1.2 10*3/uL (ref 0.7–4.0)
MCH: 31 pg (ref 26.0–34.0)
MCHC: 33.3 g/dL (ref 30.0–36.0)
MCV: 93 fL (ref 80.0–100.0)
Monocytes Absolute: 0.7 10*3/uL (ref 0.1–1.0)
Monocytes Relative: 10 %
Neutro Abs: 4.3 10*3/uL (ref 1.7–7.7)
Neutrophils Relative %: 68 %
Platelets: 94 10*3/uL — ABNORMAL LOW (ref 150–400)
RBC: 4.71 MIL/uL (ref 4.22–5.81)
RDW: 13.2 % (ref 11.5–15.5)
WBC: 6.3 10*3/uL (ref 4.0–10.5)
nRBC: 0 % (ref 0.0–0.2)

## 2019-11-25 LAB — COMPREHENSIVE METABOLIC PANEL
ALT: 15 U/L (ref 0–44)
AST: 19 U/L (ref 15–41)
Albumin: 3 g/dL — ABNORMAL LOW (ref 3.5–5.0)
Alkaline Phosphatase: 55 U/L (ref 38–126)
Anion gap: 11 (ref 5–15)
BUN: 32 mg/dL — ABNORMAL HIGH (ref 8–23)
CO2: 25 mmol/L (ref 22–32)
Calcium: 9.7 mg/dL (ref 8.9–10.3)
Chloride: 105 mmol/L (ref 98–111)
Creatinine, Ser: 2.04 mg/dL — ABNORMAL HIGH (ref 0.61–1.24)
GFR calc Af Amer: 33 mL/min — ABNORMAL LOW (ref >60)
GFR calc non Af Amer: 28 mL/min — ABNORMAL LOW (ref >60)
Glucose, Bld: 78 mg/dL (ref 70–99)
Potassium: 3.9 mmol/L (ref 3.5–5.1)
Sodium: 141 mmol/L (ref 135–145)
Total Bilirubin: 1 mg/dL (ref 0.3–1.2)
Total Protein: 5.8 g/dL — ABNORMAL LOW (ref 6.5–8.1)

## 2019-11-25 LAB — BASIC METABOLIC PANEL
Anion gap: 7 (ref 5–15)
BUN: 30 mg/dL — ABNORMAL HIGH (ref 8–23)
CO2: 29 mmol/L (ref 22–32)
Calcium: 9.8 mg/dL (ref 8.9–10.3)
Chloride: 103 mmol/L (ref 98–111)
Creatinine, Ser: 1.88 mg/dL — ABNORMAL HIGH (ref 0.61–1.24)
GFR calc Af Amer: 36 mL/min — ABNORMAL LOW (ref >60)
GFR calc non Af Amer: 31 mL/min — ABNORMAL LOW (ref >60)
Glucose, Bld: 126 mg/dL — ABNORMAL HIGH (ref 70–99)
Potassium: 4 mmol/L (ref 3.5–5.1)
Sodium: 139 mmol/L (ref 135–145)

## 2019-11-25 LAB — ECHOCARDIOGRAM COMPLETE
Height: 69 in
Weight: 2131.2 oz

## 2019-11-25 LAB — PROTIME-INR
INR: 1.1 (ref 0.8–1.2)
Prothrombin Time: 14.3 seconds (ref 11.4–15.2)

## 2019-11-25 MED ORDER — MIDAZOLAM HCL 2 MG/2ML IJ SOLN
INTRAMUSCULAR | Status: AC
  Filled 2019-11-25: qty 4

## 2019-11-25 MED ORDER — SODIUM CHLORIDE 0.9 % IV SOLN
2.0000 g | Freq: Once | INTRAVENOUS | Status: AC
  Administered 2019-11-25: 2 g via INTRAVENOUS
  Filled 2019-11-25 (×2): qty 20

## 2019-11-25 MED ORDER — FENTANYL CITRATE (PF) 100 MCG/2ML IJ SOLN
INTRAMUSCULAR | Status: AC | PRN
  Administered 2019-11-25: 50 ug via INTRAVENOUS

## 2019-11-25 MED ORDER — CALCIUM CARBONATE ANTACID 500 MG PO CHEW: 2.0000 | CHEWABLE_TABLET | ORAL | Status: DC

## 2019-11-25 MED ORDER — HEPARIN SODIUM (PORCINE) 1000 UNIT/ML IJ SOLN
INTRAMUSCULAR | Status: AC
  Filled 2019-11-25: qty 4

## 2019-11-25 MED ORDER — HEPARIN SODIUM (PORCINE) 1000 UNIT/ML IJ SOLN
1000.0000 [IU] | Freq: Once | INTRAMUSCULAR | Status: DC
  Filled 2019-11-25: qty 1

## 2019-11-25 MED ORDER — FENTANYL CITRATE (PF) 100 MCG/2ML IJ SOLN
INTRAMUSCULAR | Status: AC
  Filled 2019-11-25: qty 4

## 2019-11-25 MED ORDER — HEPARIN SODIUM (PORCINE) 1000 UNIT/ML IJ SOLN
INTRAMUSCULAR | Status: AC
  Filled 2019-11-25: qty 1

## 2019-11-25 MED ORDER — ACD FORMULA A 0.73-2.45-2.2 GM/100ML VI SOLN
500.0000 mL | Status: DC
  Filled 2019-11-25: qty 500

## 2019-11-25 MED ORDER — CEFAZOLIN SODIUM-DEXTROSE 2-4 GM/100ML-% IV SOLN
2.0000 g | Freq: Once | INTRAVENOUS | Status: AC
  Administered 2019-11-25: 2 g via INTRAVENOUS
  Filled 2019-11-25: qty 100

## 2019-11-25 MED ORDER — HEPARIN SODIUM (PORCINE) 1000 UNIT/ML IJ SOLN
INTRAMUSCULAR | Status: AC | PRN
  Administered 2019-11-25: 3200 [IU] via INTRAVENOUS

## 2019-11-25 MED ORDER — ACD FORMULA A 0.73-2.45-2.2 GM/100ML VI SOLN
Status: AC
  Administered 2019-11-25: 500 mL via INTRAVENOUS
  Filled 2019-11-25: qty 500

## 2019-11-25 MED ORDER — ACETAMINOPHEN 325 MG PO TABS: 650.0000 mg | ORAL_TABLET | ORAL | Status: DC | PRN

## 2019-11-25 MED ORDER — CHLORHEXIDINE GLUCONATE CLOTH 2 % EX PADS
6.0000 | MEDICATED_PAD | Freq: Every day | CUTANEOUS | Status: DC
  Administered 2019-11-25 – 2019-12-03 (×9): 6 via TOPICAL

## 2019-11-25 MED ORDER — DIPHENHYDRAMINE HCL 25 MG PO CAPS: 25.0000 mg | ORAL_CAPSULE | Freq: Four times a day (QID) | ORAL | Status: DC | PRN

## 2019-11-25 MED ORDER — LIDOCAINE HCL 1 % IJ SOLN
INTRAMUSCULAR | Status: AC | PRN
  Administered 2019-11-25: 10 mL

## 2019-11-25 MED ORDER — GELATIN ABSORBABLE 12-7 MM EX MISC
CUTANEOUS | Status: AC | PRN
  Administered 2019-11-25: 1 via TOPICAL

## 2019-11-25 MED ORDER — CEFAZOLIN SODIUM-DEXTROSE 2-4 GM/100ML-% IV SOLN
INTRAVENOUS | Status: AC
  Filled 2019-11-25: qty 100

## 2019-11-25 MED ORDER — MIDAZOLAM HCL 2 MG/2ML IJ SOLN
INTRAMUSCULAR | Status: AC | PRN
  Administered 2019-11-25: 1 mg via INTRAVENOUS

## 2019-11-25 MED ORDER — LIDOCAINE HCL 1 % IJ SOLN
INTRAMUSCULAR | Status: AC
  Filled 2019-11-25: qty 20

## 2019-11-25 MED ORDER — SODIUM CHLORIDE 0.9 % IV SOLN
INTRAVENOUS | Status: AC
  Filled 2019-11-25 (×3): qty 200

## 2019-11-25 MED ORDER — SODIUM CHLORIDE 0.9 % IV SOLN
Freq: Once | INTRAVENOUS | Status: DC
  Filled 2019-11-25: qty 200

## 2019-11-25 MED ORDER — GELATIN ABSORBABLE 12-7 MM EX MISC
CUTANEOUS | Status: AC
  Filled 2019-11-25: qty 1

## 2019-11-25 MED ORDER — CALCIUM CARBONATE ANTACID 500 MG PO CHEW
CHEWABLE_TABLET | ORAL | Status: AC
  Administered 2019-11-25: 20:00:00 400 mg via ORAL
  Filled 2019-11-25: qty 4

## 2019-11-25 MED ORDER — SODIUM CHLORIDE 0.9 % IV SOLN
2.0000 g | Freq: Once | INTRAVENOUS | Status: DC
  Filled 2019-11-25: qty 20

## 2019-11-25 NOTE — Evaluation (Signed)
Clinical/Bedside Swallow Evaluation Patient Details  Name: Jason Riley MRN: [id number] Date of Birth: 02/01/1929  Today's Date: 11/25/2019 Time: SLP Start Time (ACUTE ONLY): 1237 SLP Stop Time (ACUTE ONLY): [id number] SLP Time Calculation (min) (ACUTE ONLY): 20 min  Past Medical History:  Past Medical History:  Diagnosis Date  . CN (constipation)   . Elevated transaminase level   . History of measles   . Hypertension   . Hypothyroidism   . Myasthenia gravis ([place])   . Myasthenia gravis ([place])   . Prostate cancer [facility])    Prostatectomy 2001  . Severe aortic stenosis    Past Surgical History:  Past Surgical History:  Procedure Laterality Date  . APPENDECTOMY  1955  . HERNIA REPAIR Right 2006   Dr. Jamal Collin  . IR FLUORO GUIDE CV LINE LEFT  05/03/2018  . IR FLUORO GUIDE CV LINE RIGHT  11/25/2019  . IR US GUIDE VASC ACCESS RIGHT  05/03/2018  . IR US GUIDE VASC ACCESS RIGHT  11/25/2019  . PROSTATECTOMY  2001   Dr. Yves Dill   HPI:  Pt is an 86 y.o. male with medical history significant of myasthenia gravis, severe aortic stenosis hypertension, hypothyroidism who presented with increasing generalized weakness, trouble swallowing, weight loss. Chest x-ray of 11/24/19 was stable without evidence of acute or active cardiopulmonary disease.   Assessment / Plan / Recommendation Clinical Impression  Pt was seen for bedside swallow evaluation and reported that he has been having difficulty swallowing for the past 3-4 months. He characterized the difficulty as coughing with liquids if he takes too large of a sip and drinks too quickly. He tolerated all solids and liquids without signs of aspiration but exhibited multiple swallows with solids boluses, suggesting possible residue. A modified barium swallow study has been ordered by the referring physician and will be scheduled for 11/26/19 to further assess the pharyngeal phase of the swallow. Pt and RN have been educated regarding this and have verbalized agreement.   SLP Visit Diagnosis: Dysphagia, unspecified (R13.10)    Aspiration Risk       Diet Recommendation Dysphagia 2 (Fine chop);Thin liquid   Liquid Administration via: Cup;Straw Medication Administration: Whole meds with puree Supervision: Intermittent supervision to cue for compensatory strategies Compensations: Small sips/bites;Slow rate;Multiple dry swallows after each bite/sip    Other  Recommendations Oral Care Recommendations: Oral care BID   Follow up Recommendations Other (comment)(TBD)      Frequency and Duration min 2x/week  2 weeks       Prognosis Prognosis for Safe Diet Advancement: Good      Swallow Study   General Date of Onset: 11/24/19 HPI: Pt is an 86 y.o. male with medical history significant of myasthenia gravis, severe aortic stenosis hypertension, hypothyroidism who presented with increasing generalized weakness, trouble swallowing, weight loss. Chest x-ray of 11/24/19 was stable without evidence of acute or active cardiopulmonary disease. Type of Study: Bedside Swallow Evaluation Previous Swallow Assessment: None Diet Prior to this Study: NPO Temperature Spikes Noted: No Respiratory Status: Room air History of Recent Intubation: No Behavior/Cognition: Alert;Cooperative;Pleasant mood Oral Cavity Assessment: Within Functional Limits Oral Care Completed by SLP: No Oral Cavity - Dentition: Edentulous Vision: Functional for self-feeding Self-Feeding Abilities: Able to feed self Patient Positioning: Upright in bed Baseline Vocal Quality: Normal Volitional Cough: Strong Volitional Swallow: Able to elicit    Oral/Motor/Sensory Function Overall Oral Motor/Sensory Function: Within functional limits   Ice Chips Ice chips: Within functional limits Presentation: Spoon   Thin Liquid Thin Liquid:  Within functional limits Presentation: Straw;Cup    Nectar Thick Nectar Thick Liquid: Not tested   Honey Thick Honey Thick Liquid: Not tested   Puree Puree:  Impaired Presentation: Spoon;Self Fed Pharyngeal Phase Impairments: Multiple swallows   Solid     Solid: Impaired Presentation: Self Fed Pharyngeal Phase Impairments: Multiple swallows     Kenyatta Gloeckner I. Hardin Negus, [place], [place] Office number [phone] Pager [phone]  Horton Marshall 11/25/2019,1:41 PM

## 2019-11-25 NOTE — Progress Notes (Signed)
Lisinopril discontinued as ACE inhibitors may result in hypotension in combination with PLEX. This medication should be restarted on discharge.   Electronically signed: Dr. Kerney Elbe

## 2019-11-25 NOTE — Progress Notes (Signed)
  Echocardiogram 2D Echocardiogram has been attempted. Patient leaving for procedure.  Jason Riley G Selestino Nila 11/25/2019, 9:06 AM

## 2019-11-25 NOTE — Procedures (Signed)
Interventional Radiology Procedure Note  Procedure: Placement of a right IJ approach 19cm tip to cuff HD catheter, for plasmapheresis.  Tip is positioned at the superior cavoatrial junction and catheter is ready for immediate use.  Complications: None Recommendations:  - Ok to use - Do not submerge - Routine line care   Signed,  Dulcy Fanny. Earleen Newport, DO

## 2019-11-25 NOTE — Progress Notes (Signed)
PT Cancellation Note  Patient Details Name: Jason Riley MRN: [id number] DOB: August 22, 1929   Cancelled Treatment:    Reason Eval/Treat Not Completed: (P) Patient at procedure or test/unavailable(echo in room with patient just starting will defer tx at this time and follow up per POC.)   Takhia Spoon [name] 11/25/2019, 11:19 AM [name] , PTA Acute Rehabilitation Services Pager [phone] Office [phone]

## 2019-11-25 NOTE — Progress Notes (Signed)
SLP Cancellation Note  Patient Details Name: Jason Riley MRN: [id number] DOB: 1929/02/13   Cancelled treatment:       Reason Eval/Treat Not Completed: Patient at procedure or test/unavailable;Patient not medically ready(Pt is currently NPO for procedure. SLP will follow up. )  Wil Slape I. Hardin Negus, [place], [place] Office number [phone] Pager [place] 11/25/2019, 8:54 AM

## 2019-11-25 NOTE — Progress Notes (Signed)
Chief Complaint: Patient was seen in consultation today for pheresis catheter at the request of Dr. Cheral Marker  Referring Physician(s): Dr. Cheral Marker  Supervising Physician: Sandi Mariscal  Patient Status: [facility] - In-[facility]  History of Present Illness: Jason Riley is a 86 y.o. male with hx of myasthenia gravis, currently admitted with exacerbation. He was seen by neurology and is recommended for plasmapheresis. IR is asked to place pheresis catheter. PMHx, meds, labs, imaging, allergies reviewed. Feels well, no recent fevers, chills, illness. Has been NPO today as directed.    Past Medical History:  Diagnosis Date  . CN (constipation)   . Elevated transaminase level   . History of measles   . Hypertension   . Hypothyroidism   . Myasthenia gravis ([place])   . Myasthenia gravis ([place])   . Prostate cancer [facility])    Prostatectomy 2001  . Severe aortic stenosis     Past Surgical History:  Procedure Laterality Date  . APPENDECTOMY  1955  . HERNIA REPAIR Right 2006   Dr. Jamal Collin  . IR FLUORO GUIDE CV LINE LEFT  05/03/2018  . IR US GUIDE VASC ACCESS RIGHT  05/03/2018  . PROSTATECTOMY  2001   Dr. Yves Dill    Allergies: Atorvastatin and Sulfa antibiotics  Medications:  Current Facility-Administered Medications:  .  aspirin EC tablet 81 mg, 81 mg, Oral, Daily, Zhang, Ping T, MD .  ceFAZolin (ANCEF) 2-4 GM/100ML-% IVPB, , , ,  .  ceFAZolin (ANCEF) IVPB 2g/100 mL premix, 2 g, Intravenous, Once, Ivery Michalski, PA-C .  feeding supplement (ENSURE ENLIVE) (ENSURE ENLIVE) liquid 237 mL, 237 mL, Oral, BID BM, Wynetta Fines T, MD, 237 mL at 11/24/19 1822 .  heparin 1000 UNIT/ML injection, , , ,  .  heparin injection 5,000 Units, 5,000 Units, Subcutaneous, Q12H, Lequita Halt, MD, 5,000 Units at 11/24/19 2145 .  [DISCONTINUED] lisinopril (ZESTRIL) tablet 10 mg, 10 mg, Oral, Daily **AND** hydrochlorothiazide (MICROZIDE) capsule 12.5 mg, 12.5 mg, Oral, Daily, Wynetta Fines T, MD .  levothyroxine  (SYNTHROID) tablet 50 mcg, 50 mcg, Oral, Daily, Wynetta Fines T, MD, 50 mcg at 11/25/19 [phone] .  lidocaine (XYLOCAINE) 1 % (with pres) injection, , , ,  .  mycophenolate (CELLCEPT) capsule 250 mg, 250 mg, Oral, BID, Wynetta Fines T, MD, 250 mg at 11/24/19 2146 .  rosuvastatin (CRESTOR) tablet 20 mg, 20 mg, Oral, Daily, Lequita Halt, MD    Family History  Problem Relation Age of Onset  . Heart disease Mother   . Colon cancer Father   . Obesity Sister     Social History   Socioeconomic History  . Marital status: Widowed    Spouse name: Not on file  . Number of children: 1  . Years of education: Some Coll  . Highest education level: Some college, no degree  Occupational History  . Occupation: Retired  Tobacco Use  . Smoking status: Former Smoker    Years: 15.00    Types: Cigars  . Smokeless tobacco: Current User    Types: Chew  . Tobacco comment: quit over 30 years ago  Substance and Sexual Activity  . Alcohol use: No    Alcohol/week: 0.0 standard drinks  . Drug use: No  . Sexual activity: Not Currently  Other Topics Concern  . Not on file  Social History Narrative  . Not on file   Social Determinants of Health   Financial Resource Strain:   . Difficulty of Paying Living Expenses: Not on file  Food Insecurity:   . Worried About Charity fundraiser in the Last Year: Not on file  . Ran Out of Food in the Last Year: Not on file  Transportation Needs:   . Lack of Transportation (Medical): Not on file  . Lack of Transportation (Non-Medical): Not on file  Physical Activity:   . Days of Exercise per Week: Not on file  . Minutes of Exercise per Session: Not on file  Stress:   . Feeling of Stress : Not on file  Social Connections:   . Frequency of Communication with Friends and Family: Not on file  . Frequency of Social Gatherings with Friends and Family: Not on file  . Attends Religious Services: Not on file  . Active Member of Clubs or Organizations: Not on file  .  Attends Archivist Meetings: Not on file  . Marital Status: Not on file     Review of Systems: A 12 point ROS discussed and pertinent positives are indicated in the HPI above.  All other systems are negative.  Review of Systems  Vital Signs: BP (!) 117/49 (BP Location: Left Arm)   Pulse (!) 59   Temp 97.9 F (36.6 C) (Oral)   Resp 20   Ht 5\' 9"  (1.753 m)   SpO2 96%   BMI 19.67 kg/m   Physical Exam Constitutional:      Appearance: Normal appearance.  HENT:     Mouth/Throat:     Mouth: Mucous membranes are moist.     Pharynx: Oropharynx is clear.  Cardiovascular:     Rate and Rhythm: Normal rate and regular rhythm.     Heart sounds: Normal heart sounds.  Pulmonary:     Effort: Pulmonary effort is normal. No respiratory distress.     Breath sounds: Normal breath sounds.  Skin:    General: Skin is warm and dry.  Neurological:     General: No focal deficit present.     Mental Status: He is alert and oriented to person, place, and time.      Imaging: DG Chest 2 View  Result Date: 11/24/2019 CLINICAL DATA:  Dysphagia. EXAM: CHEST - 2 VIEW COMPARISON:  May 02, 2018 FINDINGS: Mild chronic appearing increased lung markings are seen. A stable subcentimeter calcified nodular opacity is seen overlying the lateral aspect of the mid right lung. There is no evidence of a pleural effusion or pneumothorax. The heart size and mediastinal contours are within normal limits. There is moderate severity calcification of the aortic arch. Multilevel degenerative changes seen throughout the thoracic spine. IMPRESSION: Stable exam without evidence of acute or active cardiopulmonary disease. Electronically Signed   By: Virgina Norfolk M.D.   On: 11/24/2019 17:11    Labs:  CBC: Recent Labs    11/16/19 1220 11/25/19 0226  WBC 6.8 6.3  HGB 17.0 14.6  HCT 51.1* 43.8  PLT 141* 94*    COAGS: Recent Labs    11/25/19 0226  INR 1.1    BMP: Recent Labs    11/16/19 1220  11/24/19 2131 11/25/19 0226  NA 144 139 141  K 4.7 3.8 3.9  CL 106 103 105  CO2 19* 26 25  GLUCOSE 87 158* 78  BUN 32* 32* 32*  CALCIUM 10.3* 9.7 9.7  CREATININE 2.00* 2.09* 2.04*  GFRNONAA 29* 27* 28*  GFRAA 33* 32* 33*    LIVER FUNCTION TESTS: Recent Labs    11/16/19 1220 11/24/19 2131 11/25/19 0226  BILITOT 0.8 1.0 1.0  AST 16 21 19   ALT 14 15 15   ALKPHOS 73 55 55  PROT 6.5 5.5* 5.8*  ALBUMIN 3.9 2.9* 3.0*    TUMOR MARKERS: No results for input(s): AFPTM, CEA, CA199, CHROMGRNA in the last 8760 hours.  Assessment and Plan: Myasthenia exacerbation For plasmapheresis cath placement Labs reviewed. Risks and benefits of image guided pheresis placement was discussed with the patient including, but not limited to bleeding, infection, pneumothorax, or fibrin sheath development and need for additional procedures.  All of the patient's questions were answered, patient is agreeable to proceed. Consent signed and in chart.    Thank you for this interesting consult.  I greatly enjoyed meeting Jason Riley and look forward to participating in their care.  A copy of this report was sent to the requesting provider on this date.  Electronically Signed: Ascencion Dike, PA-C 11/25/2019, 9:19 AM   I spent a total of 20 minutes in face to face in clinical consultation, greater than 50% of which was counseling/coordinating care for pheresis catheter

## 2019-11-25 NOTE — Evaluation (Signed)
Occupational Therapy Evaluation Patient Details Name: Jason Riley MRN: [id number] DOB: 1929-01-08 Today's Date: 11/25/2019    History of Present Illness Pt is an 86 y/o male admittes secondary to increased weakness, trouble swallowing and weight loss. Thought to be secondary to myasthenia gravis flare. PMH includes myasthenia gravis, aortic stenosis, HTN, and prostate cancer.    Clinical Impression   Pt admitted with the above diagnoses and presents with below problem list. Pt will benefit from continued acute OT to address the below listed deficits and maximize independence with basic ADLs prior to d/c home. At baseline, pt is independent with ADLs and resides in a retirement community. Pt currently setup to min guard with ADLs and functional transfers/mobility.      Follow Up Recommendations  Home health OT;Supervision - Intermittent    Equipment Recommendations  None recommended by OT    Recommendations for Other Services       Precautions / Restrictions Precautions Precautions: Fall Restrictions Weight Bearing Restrictions: No      Mobility Bed Mobility Overal bed mobility: Needs Assistance Bed Mobility: Supine to Sit;Sit to Supine     Supine to sit: Supervision Sit to supine: Supervision   General bed mobility comments: Supervision for safety   Transfers Overall transfer level: Needs assistance Equipment used: None Transfers: Sit to/from Stand Sit to Stand: Min guard         General transfer comment: Min guard for safety.     Balance Overall balance assessment: Needs assistance Sitting-balance support: No upper extremity supported;Feet supported Sitting balance-Leahy Scale: Good     Standing balance support: No upper extremity supported;During functional activity Standing balance-Leahy Scale: Fair                             ADL either performed or assessed with clinical judgement   ADL Overall ADL's : Needs  assistance/impaired Eating/Feeding: Set up;Sitting   Grooming: Min guard;Standing;Wash/dry hands   Upper Body Bathing: Set up;Sitting   Lower Body Bathing: Min guard;Sit to/from stand   Upper Body Dressing : Set up;Sitting   Lower Body Dressing: Min guard;Sit to/from stand   Toilet Transfer: Min guard;Ambulation   Toileting- Clothing Manipulation and Hygiene: Min guard;Sit to/from stand   Tub/ Shower Transfer: Min guard;Ambulation   Functional mobility during ADLs: Min guard General ADL Comments: Pt completed in room mobility, voided standing at toilet, grooming task in standing. Limited session due to arrival of ECHO.     Vision Baseline Vision/History: Wears glasses       Perception     Praxis      Pertinent Vitals/Pain Pain Assessment: No/denies pain     Hand Dominance     Extremity/Trunk Assessment Upper Extremity Assessment Upper Extremity Assessment: Generalized weakness   Lower Extremity Assessment Lower Extremity Assessment: Defer to PT evaluation   Cervical / Trunk Assessment Cervical / Trunk Assessment: Kyphotic   Communication Communication Communication: HOH   Cognition Arousal/Alertness: Awake/alert Behavior During Therapy: WFL for tasks assessed/performed Overall Cognitive Status: Within Functional Limits for tasks assessed                                     General Comments       Exercises     Shoulder Instructions      Home Living Family/patient expects to be discharged to:: Private residence Living Arrangements: Alone Available Help  at Discharge: Family;Available PRN/intermittently Type of Home: Other(Comment)(retirement community) Home Access: Elevator;Level entry     Home Layout: One level     Bathroom Shower/Tub: Occupational psychologist: Standard     Home Equipment: Environmental consultant - 2 wheels;Grab bars - tub/shower          Prior Functioning/Environment Level of Independence: Independent                  OT Problem List: Decreased strength;Decreased activity tolerance;Impaired balance (sitting and/or standing);Decreased knowledge of use of DME or AE;Decreased knowledge of precautions      OT Treatment/Interventions: Self-care/ADL training;Therapeutic exercise;DME and/or AE instruction;Energy conservation;Therapeutic activities;Patient/family education;Balance training    OT Goals(Current goals can be found in the care plan section) Acute Rehab OT Goals Patient Stated Goal: not stated.  OT Goal Formulation: With patient Time For Goal Achievement: 12/09/19 Potential to Achieve Goals: Good ADL Goals Pt Will Perform Lower Body Bathing: with modified independence;sit to/from stand Pt Will Perform Lower Body Dressing: with modified independence;sit to/from stand Pt Will Perform Tub/Shower Transfer: with modified independence;Shower transfer;shower Hotel manager will Perform Home Exercise Program: Increased strength;Right Upper extremity;Left upper extremity;With theraband;With written HEP provided;Independently  OT Frequency: Min 2X/week   Barriers to D/C:            Co-evaluation              AM-PAC OT "6 Clicks" Daily Activity     Outcome Measure Help from another person eating meals?: None Help from another person taking care of personal grooming?: None Help from another person toileting, which includes using toliet, bedpan, or urinal?: None Help from another person bathing (including washing, rinsing, drying)?: A Little Help from another person to put on and taking off regular upper body clothing?: None Help from another person to put on and taking off regular lower body clothing?: A Little 6 Click Score: 22   End of Session    Activity Tolerance: Patient tolerated treatment well Patient left: in bed;with call bell/phone within reach;with nursing/sitter in room  OT Visit Diagnosis: Unsteadiness on feet (R26.81);Muscle weakness (generalized) (M62.81)                 Time: [id number] OT Time Calculation (min): 9 min Charges:  OT General Charges $OT Visit: 1 Visit OT Evaluation $OT Eval Low Complexity: [place], OT Acute Rehabilitation Services Pager: [phone] Office: [phone]   Hortencia Pilar 11/25/2019, 1:18 PM

## 2019-11-25 NOTE — Progress Notes (Signed)
SLP Cancellation Note  Patient Details Name: Jason Riley MRN: [id number] DOB: 01/13/1929   Cancelled treatment:       Reason Eval/Treat Not Completed: Patient at procedure or test/unavailable(Pt currently having echo completed. SLP will follow up. )  Jason Riley I. Hardin Negus, [place], [place] Office number [phone] Pager [place] 11/25/2019, 11:31 AM

## 2019-11-25 NOTE — Progress Notes (Signed)
PROGRESS NOTE  DIQUAN CHEHAB [id number] DOB: Jun 03, 1929 DOA: 11/24/2019 PCP: [name], MD  Hospital Course/Subjective: Jason GISLASON is a 86 y.o. male with medical history significant of myasthenia gravis, severe aortic stenosis hypertension, hypothyroidism presented with increasing generalized weakness, trouble swallowing, weight loss. Seen by neurology who recommends NG until swallowing better, plasma exchange for next 5 days with treatment every other day.  Assessment/Plan: Active Problems:   Myasthenia gravis ([place])  Myasthenia gravis exacerbation Neurology Dr. Cheral Marker aware, plan to start plasma exchange for 4 to 5 days. Continue his home medications, holding ACE.  Dysphagia Likley related to the worsening MG from not coherent with his meds. But other etiology may need to be ruled out, such as severe aortic stenosis can also possibly cause dysphagia. Order barium swallow Speech evaluation Liquid diet plus Ensure supplements for now Neuro recommends consider NG tube for PO meds, will place after SLP workup.  Generalized weakness Likely related to MG exacerbation, again other etiologies should be considered such as severe aortic stenosis, ordered a echocardiogram, recent echocardiogram in 2019 showed severe aortic stenosis with an area less than 1 cm, gradient 92. PT evaluation  Severe aortic stenosis, as above. Monitor patient volume status.  Severe protein calorie malnutrition From dysphagia, as above.  HTN, continue home meds.  Hypothyroidism, on Synthroid   DVT prophylaxis: Heparin subcu Code Status: Full code Family Communication: Patient niece at bedside, all questions answered at my best knowledge Disposition Plan: Probably will need rehab, ordered physical therapy evaluation Consults called: Neurology Dr. Cheral Marker Admission status: Telemetry admission   Objective: Vitals:   11/24/19 1945 11/24/19 2337 11/25/19 0342 11/25/19 0802  BP: (!)  120/55 (!) 115/53 (!) 98/57 (!) 117/49  Pulse: 66 64 62 (!) 59  Resp:    20  Temp: (!) 97.5 F (36.4 C) 98.6 F (37 C) 98.2 F (36.8 C) 97.9 F (36.6 C)  TempSrc: Oral Oral Oral Oral  SpO2: 99% 97% 97% 96%  Height:       No intake or output data in the 24 hours ending 11/25/19 0816 There were no vitals filed for this visit.   Exam: General:  Alert, oriented, calm, in no acute distress, hard of hearing Eyes: EOMI, clear sclerea Neck: supple, no masses, trachea mildline  Cardiovascular: RRR, AS murmur, no peripheral edema  Respiratory: clear to auscultation bilaterally, no wheezes, no crackles  Abdomen: soft, nontender, nondistended, normal bowel tones heard  Skin: dry, no rashes  Musculoskeletal: no joint effusions, normal range of motion  Psychiatric: appropriate affect, normal speech  Neurologic: extraocular muscles intact, clear speech, moving all extremities with intact sensorium but globally weak   Data Reviewed: CBC: Recent Labs  Lab 11/25/19 0226  WBC 6.3  NEUTROABS 4.3  HGB 14.6  HCT 43.8  MCV 93.0  PLT 94*   Basic Metabolic Panel: Recent Labs  Lab 11/24/19 2131 11/25/19 0226  NA 139 141  K 3.8 3.9  CL 103 105  CO2 26 25  GLUCOSE 158* 78  BUN 32* 32*  CREATININE 2.09* 2.04*  CALCIUM 9.7 9.7   GFR: Estimated Creatinine Clearance: 21.4 mL/min (A) (by C-G formula based on SCr of 2.04 mg/dL (H)). Liver Function Tests: Recent Labs  Lab 11/24/19 2131 11/25/19 0226  AST 21 19  ALT 15 15  ALKPHOS 55 55  BILITOT 1.0 1.0  PROT 5.5* 5.8*  ALBUMIN 2.9* 3.0*   No results for input(s): LIPASE, AMYLASE in the last 168 hours. No results for  input(s): AMMONIA in the last 168 hours. Coagulation Profile: Recent Labs  Lab 11/25/19 0226  INR 1.1   Cardiac Enzymes: No results for input(s): CKTOTAL, CKMB, CKMBINDEX, TROPONINI in the last 168 hours. BNP (last 3 results) No results for input(s): PROBNP in the last 8760 hours. HbA1C: No results for  input(s): HGBA1C in the last 72 hours. CBG: No results for input(s): GLUCAP in the last 168 hours. Lipid Profile: No results for input(s): CHOL, HDL, LDLCALC, TRIG, CHOLHDL, LDLDIRECT in the last 72 hours. Thyroid Function Tests: Recent Labs    11/24/19 1500  TSH 2.975   Anemia Panel: No results for input(s): VITAMINB12, FOLATE, FERRITIN, TIBC, IRON, RETICCTPCT in the last 72 hours. Urine analysis:    Component Value Date/Time   COLORURINE YELLOW 05/02/2018 1750   APPEARANCEUR HAZY (A) 05/02/2018 1750   LABSPEC 1.018 05/02/2018 1750   PHURINE 5.0 05/02/2018 1750   GLUCOSEU NEGATIVE 05/02/2018 1750   HGBUR NEGATIVE 05/02/2018 1750   BILIRUBINUR NEGATIVE 05/02/2018 1750   KETONESUR NEGATIVE 05/02/2018 1750   PROTEINUR NEGATIVE 05/02/2018 1750   NITRITE NEGATIVE 05/02/2018 1750   LEUKOCYTESUR NEGATIVE 05/02/2018 1750   Sepsis Labs: @LABRCNTIP (procalcitonin:4,lacticidven:4)  ) Recent Results (from the past 240 hour(s))  SARS CORONAVIRUS 2 (TAT 6-24 HRS) Nasopharyngeal Nasopharyngeal Swab     Status: None   Collection Time: 11/24/19  3:12 PM   Specimen: Nasopharyngeal Swab  Result Value Ref Range Status   SARS Coronavirus 2 NEGATIVE NEGATIVE Final    Comment: (NOTE) SARS-CoV-2 target nucleic acids are NOT DETECTED. The SARS-CoV-2 RNA is generally detectable in upper and lower respiratory specimens during the acute phase of infection. Negative results do not preclude SARS-CoV-2 infection, do not rule out co-infections with other pathogens, and should not be used as the sole basis for treatment or other patient management decisions. Negative results must be combined with clinical observations, patient history, and epidemiological information. The expected result is Negative. Fact Sheet for Patients: SugarRoll.be Fact Sheet for Healthcare Providers: https://www.woods-mathews.com/ This test is not yet approved or cleared by the [place] FDA and  has been authorized for detection and/or diagnosis of SARS-CoV-2 by FDA under an Emergency Use Authorization (EUA). This EUA will remain  in effect (meaning this test can be used) for the duration of the COVID-19 declaration under Section 56 4(b)(1) of the Act, 21 U.S.C. section 360bbb-3(b)(1), unless the authorization is terminated or revoked sooner. Performed at [facility], [place] [address] [place], [place] [address]      Studies: DG Chest 2 View  Result Date: 11/24/2019 CLINICAL DATA:  Dysphagia. EXAM: CHEST - 2 VIEW COMPARISON:  May 02, 2018 FINDINGS: Mild chronic appearing increased lung markings are seen. A stable subcentimeter calcified nodular opacity is seen overlying the lateral aspect of the mid right lung. There is no evidence of a pleural effusion or pneumothorax. The heart size and mediastinal contours are within normal limits. There is moderate severity calcification of the aortic arch. Multilevel degenerative changes seen throughout the thoracic spine. IMPRESSION: Stable exam without evidence of acute or active cardiopulmonary disease. Electronically Signed   By: Virgina Norfolk M.D.   On: 11/24/2019 17:11    Scheduled Meds: . aspirin EC  81 mg Oral Daily  . feeding supplement (ENSURE ENLIVE)  237 mL Oral BID BM  . heparin  5,000 Units Subcutaneous Q12H  . hydrochlorothiazide  12.5 mg Oral Daily  . levothyroxine  50 mcg Oral Daily  . mycophenolate  250 mg Oral  BID  . rosuvastatin  20 mg Oral Daily    Continuous Infusions:   LOS: 1 day   Time spent: 23 minutes  Farhaan Mabee Marry Guan, MD [facility] Pager [phone]  If 7PM-7AM, please contact night-coverage [email or web address] Password [email or web address] 11/25/2019, 8:16 AM

## 2019-11-25 NOTE — Progress Notes (Signed)
Patient left the floor at [place] for plasmapheresis and returned in the floor at 2155.

## 2019-11-25 NOTE — Progress Notes (Signed)
  Echocardiogram 2D Echocardiogram has been performed.  Jason Riley 11/25/2019, 12:11 PM

## 2019-11-26 ENCOUNTER — Inpatient Hospital Stay (HOSPITAL_COMMUNITY): Payer: Medicare Other

## 2019-11-26 LAB — COMPREHENSIVE METABOLIC PANEL
ALT: 8 U/L (ref 0–44)
AST: 11 U/L — ABNORMAL LOW (ref 15–41)
Albumin: 3.7 g/dL (ref 3.5–5.0)
Alkaline Phosphatase: 23 U/L — ABNORMAL LOW (ref 38–126)
Anion gap: 7 (ref 5–15)
BUN: 27 mg/dL — ABNORMAL HIGH (ref 8–23)
CO2: 25 mmol/L (ref 22–32)
Calcium: 9.6 mg/dL (ref 8.9–10.3)
Chloride: 109 mmol/L (ref 98–111)
Creatinine, Ser: 1.78 mg/dL — ABNORMAL HIGH (ref 0.61–1.24)
GFR calc Af Amer: 39 mL/min — ABNORMAL LOW (ref >60)
GFR calc non Af Amer: 33 mL/min — ABNORMAL LOW (ref >60)
Glucose, Bld: 103 mg/dL — ABNORMAL HIGH (ref 70–99)
Potassium: 4.3 mmol/L (ref 3.5–5.1)
Sodium: 141 mmol/L (ref 135–145)
Total Bilirubin: 1.4 mg/dL — ABNORMAL HIGH (ref 0.3–1.2)
Total Protein: 4.7 g/dL — ABNORMAL LOW (ref 6.5–8.1)

## 2019-11-26 LAB — CBC WITH DIFFERENTIAL/PLATELET
Abs Immature Granulocytes: 0.04 10*3/uL (ref 0.00–0.07)
Basophils Absolute: 0 10*3/uL (ref 0.0–0.1)
Basophils Relative: 1 %
Eosinophils Absolute: 0.1 10*3/uL (ref 0.0–0.5)
Eosinophils Relative: 2 %
HCT: 46.6 % (ref 39.0–52.0)
Hemoglobin: 15.4 g/dL (ref 13.0–17.0)
Immature Granulocytes: 1 %
Lymphocytes Relative: 18 %
Lymphs Abs: 1.2 10*3/uL (ref 0.7–4.0)
MCH: 30.9 pg (ref 26.0–34.0)
MCHC: 33 g/dL (ref 30.0–36.0)
MCV: 93.4 fL (ref 80.0–100.0)
Monocytes Absolute: 0.7 10*3/uL (ref 0.1–1.0)
Monocytes Relative: 11 %
Neutro Abs: 4.5 10*3/uL (ref 1.7–7.7)
Neutrophils Relative %: 67 %
Platelets: 86 10*3/uL — ABNORMAL LOW (ref 150–400)
RBC: 4.99 MIL/uL (ref 4.22–5.81)
RDW: 13.2 % (ref 11.5–15.5)
WBC: 6.6 10*3/uL (ref 4.0–10.5)
nRBC: 0 % (ref 0.0–0.2)

## 2019-11-26 LAB — PROTIME-INR
INR: 1.5 — ABNORMAL HIGH (ref 0.8–1.2)
Prothrombin Time: 18.4 seconds — ABNORMAL HIGH (ref 11.4–15.2)

## 2019-11-26 MED ORDER — ENSURE ENLIVE PO LIQD
237.0000 mL | Freq: Three times a day (TID) | ORAL | Status: DC
  Administered 2019-11-26 – 2019-12-04 (×22): 237 mL via ORAL

## 2019-11-26 MED ORDER — ADULT MULTIVITAMIN W/MINERALS CH
1.0000 | ORAL_TABLET | Freq: Every day | ORAL | Status: DC
  Administered 2019-11-26 – 2019-12-04 (×9): 1 via ORAL
  Filled 2019-11-26 (×9): qty 1

## 2019-11-26 NOTE — Progress Notes (Signed)
Initial Nutrition Assessment  DOCUMENTATION CODES:   Severe malnutrition in context of chronic illness  INTERVENTION:   Ensure Enlive po TID, each supplement provides 350 kcal and 20 grams of protein  Magic cup BID with meals, each supplement provides 290 kcal and 9 grams of protein  MVI with minerals daily     NUTRITION DIAGNOSIS:   Severe Malnutrition related to chronic illness(myasthenia gravis and dysphagia) as evidenced by percent weight loss, energy intake < or equal to 75% for > or equal to 1 month, moderate fat depletion, severe muscle depletion, moderate muscle depletion, severe fat depletion.    GOAL:   Patient will meet greater than or equal to 90% of their needs    MONITOR:   PO intake, Supplement acceptance, Diet advancement, Weight trends, Labs  REASON FOR ASSESSMENT:   Malnutrition Screening Tool    ASSESSMENT:   Pt with a PMH significant of myasthenia gravis, severe aortic stenosis hypertension, hypothyroidism presented with increasing generalized weakness, trouble swallowing, weight loss. Pt admitted with myasthenia gravis exacerbation and dysphagia.  Per SLP, pt reports difficulty swallowing for the past 3-4 months. SLP performed MBSS today and recommended continuation of Dysphagia 2 (fine chop) solids and thin liquids.   Pt reports poor appetite today. Pt states he enjoys the flavor of the food, but does not like the texture of the meats. Observed pt lunch tray; pt ate ~25%. Pt agreeable to Magic Cups and Ensure while in the hospital and reports typically drinking Ensure at home. RN reports pt consumed ~10% of dinner tray.  Pt noted to have lost ~30 lbs over 3 months. Based on wts available in chart, pt has experienced an 11.6% wt loss over the last year, which is significant for time frame. Pt endorses this wt loss.   Medications reviewed and include: Ensure Enlive BID  Labs reviewed.  UOP: 573mL x 24 hours I/O: -441mL since admit  NUTRITION  - FOCUSED PHYSICAL EXAM:    Most Recent Value  Orbital Region  Moderate depletion  Upper Arm Region  Severe depletion  Thoracic and Lumbar Region  Severe depletion  Temple Region  Moderate depletion  Clavicle Bone Region  Severe depletion  Clavicle and Acromion Bone Region  Severe depletion  Scapular Bone Region  Severe depletion  Dorsal Hand  Moderate depletion  Patellar Region  Severe depletion  Anterior Thigh Region  Severe depletion  Posterior Calf Region  Severe depletion  Edema (RD Assessment)  None  Hair  Reviewed  Eyes  Reviewed  Mouth  Reviewed  Skin  Reviewed  Nails  Reviewed       Diet Order:   Diet Order            DIET DYS 2 Room service appropriate? No; Fluid consistency: Thin  Diet effective now              EDUCATION NEEDS:   No education needs have been identified at this time  Skin:  Skin Assessment: Reviewed RN Assessment  Last BM:  11/24/19  Height:   Ht Readings from Last 1 Encounters:  11/25/19 5\' 9"  (1.753 m)    Weight:   Wt Readings from Last 1 Encounters:  11/25/19 60.4 kg    Ideal Body Weight:  72.72 kg  BMI:  Body mass index is 19.67 kg/m.  Estimated Nutritional Needs:   Kcal:  1800-2000  Protein:  90-105 grams  Fluid:  >1.8L/d    Larkin Ina, MS, RD, LDN RD pager number and weekend/on-call  pager number located in [place].

## 2019-11-26 NOTE — Progress Notes (Signed)
PROGRESS NOTE  Jason Riley EKC:[id number] DOB: [birth year] DOA: 11/24/2019 PCP: [name], MD  Hospital Course/Subjective: Jason Riley is a 86 y.o. male with medical history significant of myasthenia gravis, severe aortic stenosis hypertension, hypothyroidism presented with increasing generalized weakness, trouble swallowing, weight loss. Seen by neurology who recommends NG until swallowing better, plasma exchange for next 5 days with treatment every other day. Had first exchange 2/3.  Assessment/Plan: Active Problems:   Myasthenia gravis ([place])  Myasthenia gravis exacerbation Neurology following. Tunneled cath placed by IR 2/3. Continue his home medications, holding ACE.  Dysphagia Likley related to the worsening MG from not coherent with his meds. But other etiology may need to be ruled out, such as severe aortic stenosis can also possibly cause dysphagia. Liquid diet plus Ensure supplements for now Neuro recommends consider NG tube for PO meds, SLP has seen and recommends a noted.  Generalized weakness Likely related to MG exacerbation, again other etiologies should be considered such as severe aortic stenosis, ordered a echocardiogram, has very severe aortic stenosis. PT evaluation  Severe aortic stenosis, as above. Monitor patient volume status.  Severe protein calorie malnutrition From dysphagia, as above.  HTN, continue home meds.  Hypothyroidism, on Synthroid   DVT prophylaxis: Heparin subcu Code Status: Full code Family Communication: No family present this AM  Disposition Plan: Probably home with services. Consults called: Neurology Dr. Cheral Marker Admission status: Telemetry admission   Objective: Vitals:   11/25/19 2200 11/26/19 0000 11/26/19 0400 11/26/19 0813  BP: (!) 112/44 (!) 110/58 (!) 114/54 (!) 117/45  Pulse: 64 67 63 (!) 58  Resp: (!) 22  (!) 22 20  Temp: 98.4 F (36.9 C) 98.2 F (36.8 C) 98 F (36.7 C) 97.7 F (36.5 C)    TempSrc: Oral Oral Oral Oral  SpO2: 96% 97% 98% 98%  Weight:      Height:        Intake/Output Summary (Last 24 hours) at 11/26/2019 0858 Last data filed at 11/26/2019 0500 Gross per 24 hour  Intake 200 ml  Output 450 ml  Net -250 ml   Filed Weights   11/25/19 1033  Weight: 60.4 kg     Exam: General:  Alert, oriented, calm, in no acute distress, hard of hearing Eyes: EOMI, clear sclerea Neck: supple, no masses, trachea mildline  Cardiovascular: RRR, AS murmur, no peripheral edema  Respiratory: clear to auscultation bilaterally, no wheezes, no crackles  Abdomen: soft, nontender, nondistended, normal bowel tones heard  Skin: dry, no rashes  Musculoskeletal: no joint effusions, normal range of motion  Psychiatric: appropriate affect, normal speech  Neurologic: extraocular muscles intact, clear speech, moving all extremities with intact sensorium but globally weak   Data Reviewed: CBC: Recent Labs  Lab 11/25/19 0226 11/26/19 0445  WBC 6.3 6.6  NEUTROABS 4.3 4.5  HGB 14.6 15.4  HCT 43.8 46.6  MCV 93.0 93.4  PLT 94* 86*   Basic Metabolic Panel: Recent Labs  Lab 11/24/19 2131 11/25/19 0226 11/25/19 1238 11/26/19 0445  NA 139 141 139 141  K 3.8 3.9 4.0 4.3  CL 103 105 103 109  CO2 26 25 29 25   GLUCOSE 158* 78 126* 103*  BUN 32* 32* 30* 27*  CREATININE 2.09* 2.04* 1.88* 1.78*  CALCIUM 9.7 9.7 9.8 9.6   GFR: Estimated Creatinine Clearance: 24.5 mL/min (A) (by C-G formula based on SCr of 1.78 mg/dL (H)). Liver Function Tests: Recent Labs  Lab 11/24/19 2131 11/25/19 0226 11/26/19 0445  AST 21  19 11*  ALT 15 15 8   ALKPHOS 55 55 23*  BILITOT 1.0 1.0 1.4*  PROT 5.5* 5.8* 4.7*  ALBUMIN 2.9* 3.0* 3.7   No results for input(s): LIPASE, AMYLASE in the last 168 hours. No results for input(s): AMMONIA in the last 168 hours. Coagulation Profile: Recent Labs  Lab 11/25/19 0226 11/26/19 0445  INR 1.1 1.5*   Cardiac Enzymes: No results for input(s): CKTOTAL,  CKMB, CKMBINDEX, TROPONINI in the last 168 hours. BNP (last 3 results) No results for input(s): PROBNP in the last 8760 hours. HbA1C: No results for input(s): HGBA1C in the last 72 hours. CBG: No results for input(s): GLUCAP in the last 168 hours. Lipid Profile: No results for input(s): CHOL, HDL, LDLCALC, TRIG, CHOLHDL, LDLDIRECT in the last 72 hours. Thyroid Function Tests: Recent Labs    11/24/19 1500  TSH 2.975   Anemia Panel: No results for input(s): VITAMINB12, FOLATE, FERRITIN, TIBC, IRON, RETICCTPCT in the last 72 hours. Urine analysis:    Component Value Date/Time   COLORURINE YELLOW 05/02/2018 1750   APPEARANCEUR HAZY (A) 05/02/2018 1750   LABSPEC 1.018 05/02/2018 1750   PHURINE 5.0 05/02/2018 1750   GLUCOSEU NEGATIVE 05/02/2018 1750   HGBUR NEGATIVE 05/02/2018 1750   BILIRUBINUR NEGATIVE 05/02/2018 1750   KETONESUR NEGATIVE 05/02/2018 1750   PROTEINUR NEGATIVE 05/02/2018 1750   NITRITE NEGATIVE 05/02/2018 1750   LEUKOCYTESUR NEGATIVE 05/02/2018 1750   Sepsis Labs: @LABRCNTIP (procalcitonin:4,lacticidven:4)  ) Recent Results (from the past 240 hour(s))  SARS CORONAVIRUS 2 (TAT 6-24 HRS) Nasopharyngeal Nasopharyngeal Swab     Status: None   Collection Time: 11/24/19  3:12 PM   Specimen: Nasopharyngeal Swab  Result Value Ref Range Status   SARS Coronavirus 2 NEGATIVE NEGATIVE Final    Comment: (NOTE) SARS-CoV-2 target nucleic acids are NOT DETECTED. The SARS-CoV-2 RNA is generally detectable in upper and lower respiratory specimens during the acute phase of infection. Negative results do not preclude SARS-CoV-2 infection, do not rule out co-infections with other pathogens, and should not be used as the sole basis for treatment or other patient management decisions. Negative results must be combined with clinical observations, patient history, and epidemiological information. The expected result is Negative. Fact Sheet for  Patients: SugarRoll.be Fact Sheet for Healthcare Providers: https://www.woods-mathews.com/ This test is not yet approved or cleared by the [place] FDA and  has been authorized for detection and/or diagnosis of SARS-CoV-2 by FDA under an Emergency Use Authorization (EUA). This EUA will remain  in effect (meaning this test can be used) for the duration of the COVID-19 declaration under Section 56 4(b)(1) of the Act, 21 U.S.C. section 360bbb-3(b)(1), unless the authorization is terminated or revoked sooner. Performed at [facility], [place] [address] [place], [place] [address]      Studies: IR Fluoro Guide CV Line Right  Result Date: 11/25/2019 INDICATION: 86 year old male referred for plasmapheresis catheter placement EXAM: TUNNELED CENTRAL VENOUS HEMODIALYSIS CATHETER PLACEMENT WITH ULTRASOUND AND FLUOROSCOPIC GUIDANCE MEDICATIONS: 2 g Ancef. The antibiotic was given in an appropriate time interval prior to skin puncture. ANESTHESIA/SEDATION: Moderate (conscious) sedation was employed during this procedure. A total of Versed 1.0 mg and Fentanyl 50 mcg was administered intravenously. Moderate Sedation Time: 14 minutes. The patient's level of consciousness and vital signs were monitored continuously by radiology nursing throughout the procedure under my direct supervision. FLUOROSCOPY TIME:  Fluoroscopy Time: 0 minutes 12 seconds (5 mGy). COMPLICATIONS: None PROCEDURE: Informed written consent was obtained from the patient after a discussion  of the risks, benefits, and alternatives to treatment. Questions regarding the procedure were encouraged and answered. The right neck and chest were prepped with chlorhexidine in a sterile fashion, and a sterile drape was applied covering the operative field. Maximum barrier sterile technique with sterile gowns and gloves were used for the procedure. A timeout was performed prior to the initiation of the  procedure. After creating a small venotomy incision, a micropuncture kit was utilized to access the right internal jugular vein under direct, real-time ultrasound guidance after the overlying soft tissues were anesthetized with 1% lidocaine with epinephrine. Ultrasound image documentation was performed. The microwire was marked to measure appropriate internal catheter length. External tunneled length was estimated. A total tip to cuff length of 19 cm was selected. Skin and subcutaneous tissues of chest wall below the clavicle were generously infiltrated with 1% lidocaine for local anesthesia. A small stab incision was made with 11 blade scalpel. The selected hemodialysis catheter was tunneled in a retrograde fashion from the anterior chest wall to the venotomy incision. A guidewire was advanced to the level of the IVC and the micropuncture sheath was exchanged for a peel-away sheath. The catheter was then placed through the peel-away sheath with tips ultimately positioned within the superior aspect of the right atrium. Final catheter positioning was confirmed and documented with a spot radiographic image. The catheter aspirates and flushes normally. The catheter was flushed with appropriate volume heparin dwells. The catheter exit site was secured with a 0-Prolene retention suture. The venotomy incision was closed Derma bond and sterile dressing. Dressings were applied at the chest wall. Patient tolerated the procedure well and remained hemodynamically stable throughout. No complications were encountered and no significant blood loss encountered. IMPRESSION: Status post right IJ tunneled hemodialysis catheter placement, for use of plasmapheresis. Signed, Dulcy Fanny. Dellia Nims, RPVI [facility] Radiology Electronically Signed   By: Corrie Mckusick D.O.   On: 11/25/2019 11:38   IR US Guide Vasc Access Right  Result Date: 11/25/2019 INDICATION: 86 year old male referred  for plasmapheresis catheter placement EXAM: TUNNELED CENTRAL VENOUS HEMODIALYSIS CATHETER PLACEMENT WITH ULTRASOUND AND FLUOROSCOPIC GUIDANCE MEDICATIONS: 2 g Ancef. The antibiotic was given in an appropriate time interval prior to skin puncture. ANESTHESIA/SEDATION: Moderate (conscious) sedation was employed during this procedure. A total of Versed 1.0 mg and Fentanyl 50 mcg was administered intravenously. Moderate Sedation Time: 14 minutes. The patient's level of consciousness and vital signs were monitored continuously by radiology nursing throughout the procedure under my direct supervision. FLUOROSCOPY TIME:  Fluoroscopy Time: 0 minutes 12 seconds (5 mGy). COMPLICATIONS: None PROCEDURE: Informed written consent was obtained from the patient after a discussion of the risks, benefits, and alternatives to treatment. Questions regarding the procedure were encouraged and answered. The right neck and chest were prepped with chlorhexidine in a sterile fashion, and a sterile drape was applied covering the operative field. Maximum barrier sterile technique with sterile gowns and gloves were used for the procedure. A timeout was performed prior to the initiation of the procedure. After creating a small venotomy incision, a micropuncture kit was utilized to access the right internal jugular vein under direct, real-time ultrasound guidance after the overlying soft tissues were anesthetized with 1% lidocaine with epinephrine. Ultrasound image documentation was performed. The microwire was marked to measure appropriate internal catheter length. External tunneled length was estimated. A total tip to cuff length of 19 cm was selected. Skin and subcutaneous tissues of chest wall below the clavicle were generously  infiltrated with 1% lidocaine for local anesthesia. A small stab incision was made with 11 blade scalpel. The selected hemodialysis catheter was tunneled in a retrograde fashion from the anterior chest wall to the  venotomy incision. A guidewire was advanced to the level of the IVC and the micropuncture sheath was exchanged for a peel-away sheath. The catheter was then placed through the peel-away sheath with tips ultimately positioned within the superior aspect of the right atrium. Final catheter positioning was confirmed and documented with a spot radiographic image. The catheter aspirates and flushes normally. The catheter was flushed with appropriate volume heparin dwells. The catheter exit site was secured with a 0-Prolene retention suture. The venotomy incision was closed Derma bond and sterile dressing. Dressings were applied at the chest wall. Patient tolerated the procedure well and remained hemodynamically stable throughout. No complications were encountered and no significant blood loss encountered. IMPRESSION: Status post right IJ tunneled hemodialysis catheter placement, for use of plasmapheresis. Signed, Dulcy Fanny. Dellia Nims, RPVI [facility] Radiology Electronically Signed   By: Corrie Mckusick D.O.   On: 11/25/2019 11:38   ECHOCARDIOGRAM COMPLETE  Result Date: 11/25/2019   ECHOCARDIOGRAM REPORT   Patient Name:   Jason Riley Date of Exam: 11/25/2019 Medical Rec #:  [id number]    Height:       69.0 in Accession #:    [id number]   Weight:       133.2 lb Date of Birth:  April 20, 1929     BSA:          1.74 m Patient Age:    79 years     BP:           117/49 mmHg Patient Gender: M            HR:           59 bpm. Exam Location:  Inpatient Procedure: Color Doppler, 2D Echo and Cardiac Doppler Indications:    I35.0 Nonrheumatic aortic (valve) stenosis  History:        Patient has prior history of Echocardiogram examinations, most                 recent 05/03/2018. CAD; Risk Factors:Hypertension. Cancer.  Sonographer:    Jonelle Sidle Dance Referring Phys: [id number] Butts  1. The aortic valve is abnormal. Aortic valve regurgitation is mild to moderate. Very severe  aortic valve stenosis. Aortic valve mean gradient measures 60.0 mmHg. Valve area calculation may be inaccurate due to suboptimal LVOT doppler. Consider cardiology consultation for very severe aortic valve stenosis.  2. The mitral valve is abnormal in structure. At least moderate, eccentric mitral valve regurgitation. No evidence of mitral stenosis.  3. The tricuspid valve is normal in structure. Tricuspid valve regurgitation is moderate-severe.  4. Left ventricular ejection fraction, by visual estimation, is 55 to 60%. The left ventricle has normal function. There is mildly increased left ventricular hypertrophy.  5. Elevated left ventricular end-diastolic pressure.  6. Left ventricular diastolic parameters are consistent with Grade II diastolic dysfunction (pseudonormalization).  7. Global right ventricle has normal systolic function.The right ventricular size is normal. No increase in right ventricular wall thickness.  8. Moderately elevated pulmonary artery systolic pressure.  9. Left atrial size was moderately dilated. 10. Right atrial size was mildly dilated. 11. Moderate mitral annular calcification. 12. There is severe calcifcation of the aortic valve. 13. The pulmonic valve was normal in structure. Pulmonic valve regurgitation is trivial. 14. The  inferior vena cava is normal in size with greater than 50% respiratory variability, suggesting right atrial pressure of 3 mmHg. 15. The interatrial septum was not well visualized. FINDINGS  Left Ventricle: Left ventricular ejection fraction, by visual estimation, is 55 to 60%. The left ventricle has normal function. The left ventricle has no regional wall motion abnormalities. There is mildly increased left ventricular hypertrophy. Left ventricular diastolic parameters are consistent with Grade II diastolic dysfunction (pseudonormalization). Elevated left ventricular end-diastolic pressure. Right Ventricle: The right ventricular size is normal. No increase in right  ventricular wall thickness. Global RV systolic function is has normal systolic function. The tricuspid regurgitant velocity is 3.23 m/s, and with an assumed right atrial pressure  of 3 mmHg, the estimated right ventricular systolic pressure is moderately elevated at 44.7 mmHg. Left Atrium: Left atrial size was moderately dilated. Right Atrium: Right atrial size was mildly dilated Pericardium: There is no evidence of pericardial effusion. Mitral Valve: The mitral valve is abnormal. Moderate mitral annular calcification. Moderate mitral valve regurgitation. No evidence of mitral valve stenosis by observation. Tricuspid Valve: The tricuspid valve is normal in structure. Tricuspid valve regurgitation is moderate-severe. Aortic Valve: The aortic valve is abnormal. Aortic valve regurgitation is mild to moderate. Aortic regurgitation PHT measures 339 msec. Severe aortic stenosis is present. There is severe calcifcation of the aortic valve. Aortic valve mean gradient measures 60.0 mmHg. Aortic valve peak gradient measures 94.6 mmHg. Aortic valve area, by VTI measures 0.47 cm. Pulmonic Valve: The pulmonic valve was normal in structure. Pulmonic valve regurgitation is trivial. Pulmonic regurgitation is trivial. Aorta: The aortic root and ascending aorta are structurally normal, with no evidence of dilitation. Venous: The inferior vena cava is normal in size with greater than 50% respiratory variability, suggesting right atrial pressure of 3 mmHg. IAS/Shunts: The interatrial septum was not well visualized.  LEFT VENTRICLE PLAX 2D LVIDd:         4.22 cm  Diastology LVIDs:         2.96 cm  LV e' lateral:   9.83 cm/s LV PW:         1.03 cm  LV E/e' lateral: 11.9 LV IVS:        1.03 cm  LV e' medial:    5.18 cm/s LVOT diam:     2.30 cm  LV E/e' medial:  22.6 LV SV:         46 ml LV SV Index:   26.71 LVOT Area:     4.15 cm  RIGHT VENTRICLE             IVC RV Basal diam:  2.90 cm     IVC diam: 1.20 cm RV S prime:     10.90 cm/s  TAPSE (M-mode): 2.1 cm LEFT ATRIUM             Index       RIGHT ATRIUM           Index LA diam:        3.60 cm 2.07 cm/m  RA Area:     20.00 cm LA Vol (A2C):   63.1 ml 36.30 ml/m RA Volume:   54.10 ml  31.12 ml/m LA Vol (A4C):   92.8 ml 53.39 ml/m LA Biplane Vol: 77.6 ml 44.64 ml/m  AORTIC VALVE AV Area (Vmax):    0.44 cm AV Area (Vmean):   0.47 cm AV Area (VTI):     0.47 cm AV Vmax:  486.33 cm/s AV Vmean:          349.000 cm/s AV VTI:            1.337 m AV Peak Grad:      94.6 mmHg AV Mean Grad:      60.0 mmHg LVOT Vmax:         51.70 cm/s LVOT Vmean:        39.500 cm/s LVOT VTI:          0.150 m LVOT/AV VTI ratio: 0.11 AI PHT:            339 msec  AORTA Ao Root diam: 3.50 cm Ao Asc diam:  3.40 cm MITRAL VALVE                         TRICUSPID VALVE MV Area (PHT): 2.93 cm              TR Peak grad:   41.7 mmHg MV PHT:        74.96 msec            TR Vmax:        323.00 cm/s MV Decel Time: 259 msec MV E velocity: 117.00 cm/s 103 cm/s  SHUNTS MV A velocity: 51.70 cm/s  70.3 cm/s Systemic VTI:  0.15 m MV E/A ratio:  2.26        1.5       Systemic Diam: 2.30 cm  Cherlynn Kaiser MD Electronically signed by Cherlynn Kaiser MD Signature Date/Time: 11/25/2019/5:57:02 PM    Final     Scheduled Meds:  aspirin EC  81 mg Oral Daily   Chlorhexidine Gluconate Cloth  6 each Topical Daily   feeding supplement (ENSURE ENLIVE)  237 mL Oral BID BM   heparin       heparin  5,000 Units Subcutaneous Q12H   hydrochlorothiazide  12.5 mg Oral Daily   levothyroxine  50 mcg Oral Daily   mycophenolate  250 mg Oral BID   rosuvastatin  20 mg Oral Daily    Continuous Infusions:   LOS: 2 days   Time spent: 23 minutes  Kay Ricciuti Marry Guan, MD [facility] Pager [phone]  If 7PM-7AM, please contact night-coverage [email or web address] Password [facility] 11/26/2019, 8:58 AM

## 2019-11-26 NOTE — Progress Notes (Signed)
Modified Barium Swallow Progress Note  Patient Details  Name: Jason Riley MRN: [id number] Date of Birth: [birth year]  Today's Date: 11/26/2019  Modified Barium Swallow completed.  Full report located under Chart Review in the Imaging Section.  Brief recommendations include the following:  Clinical Impression  Pt was seen for a modified barium swallow study and he presents with mild oropharyngeal dysphagia with resultant trace laryngeal penetration of thin liquid via straw sip on today's examination.  Laryngeal penetration was observed x1 during serial straw sips of thin liquid.  No aspiration was observed with any trials and no laryngeal penetration was observed with puree, soft solids, or cup sips of thin liquid despite challenging.  Oral phase was remarkable for moderately prolonged mastication and AP transport of soft solids, reduced lingual strength resulting in trace oral residue, and reduced lingual control resulting in premature spillage to the pharynx.  Pharyngeal phase was remarkable for reduced BOT retraction and reduced hyolaryngeal elevation/excursion, resulting in vallecular and pyriform residue.  Pt appeared sensate to the residue and he cleared a portion of it given a spontaneous second swallow across trials.  Recommend continuation of Dysphagia 2 (fine chop) solids and thin liquids with medications whole or crushed in puree (per pt preference) and intermittent superversion to cue for the following compensatory strategies: 1) Small bites/sips 2) Slow rate of intake 3) Sit upright as possible.     Swallow Evaluation Recommendations       SLP Diet Recommendations: Dysphagia 2 (Fine chop) solids;Thin liquid   Liquid Administration via: Cup   Medication Administration: Whole meds with puree   Supervision: Patient able to self feed;Intermittent supervision to cue for compensatory strategies   Compensations: Small sips/bites;Slow rate;Multiple dry swallows after each bite/sip;Minimize  environmental distractions   Postural Changes: Seated upright at 90 degrees   Oral Care Recommendations: Oral care BID       Colin Mulders M.S., CCC-SLP Acute Rehabilitation Services Office: [phone]  [place] 11/26/2019,2:54 PM

## 2019-11-26 NOTE — Progress Notes (Addendum)
Physical Therapy Treatment Patient Details Name: Jason Riley MRN: [id number] DOB: November 06, 1928 Today's Date: 11/26/2019    History of Present Illness Pt is an 86 y/o male admittes secondary to increased weakness, trouble swallowing and weight loss. Thought to be secondary to myasthenia gravis flare. PMH includes myasthenia gravis, aortic stenosis, HTN, and prostate cancer.     PT Comments    Pt performed gt training with use of RW this session for improved stability.  He reports he did not sleep well last night and wished to return to bed post session..  Pt continues to benefit from HHPT follow up to address balance and strength deficits.  He continues to make progress.     Follow Up Recommendations  Home health PT;Supervision for mobility/OOB     Equipment Recommendations  Other (comment)    Recommendations for Other Services       Precautions / Restrictions Precautions Precautions: Fall Restrictions Weight Bearing Restrictions: No    Mobility  Bed Mobility Overal bed mobility: Needs Assistance Bed Mobility: Supine to Sit;Sit to Supine     Supine to sit: Supervision Sit to supine: Supervision   General bed mobility comments: Supervision for safety   Transfers Overall transfer level: Needs assistance Equipment used: Rolling walker (2 wheeled) Transfers: Sit to/from Stand Sit to Stand: Min guard         General transfer comment: Min guard for safety.  Cues for hand placement to and from seated surface.  Ambulation/Gait Ambulation/Gait assistance: Min assist Gait Distance (Feet): 40 Feet Assistive device: Rolling walker (2 wheeled) Gait Pattern/deviations: Step-through pattern;Trunk flexed;Decreased stride length     General Gait Details: Cues for upper trunk control.  Mild unsteadiness remains and utilized RW this session.  He continues limited due to fatigue.  Pt able to stand post gt training to urinate in commode.   Stairs             Wheelchair  Mobility    Modified Rankin (Stroke Patients Only)       Balance Overall balance assessment: Needs assistance Sitting-balance support: No upper extremity supported;Feet supported Sitting balance-Leahy Scale: Good       Standing balance-Leahy Scale: Fair                              Cognition Arousal/Alertness: Awake/alert Behavior During Therapy: WFL for tasks assessed/performed Overall Cognitive Status: Within Functional Limits for tasks assessed                                        Exercises      General Comments        Pertinent Vitals/Pain Pain Assessment: No/denies pain    Home Living                      Prior Function            PT Goals (current goals can now be found in the care plan section) Acute Rehab PT Goals Patient Stated Goal: not stated.  Potential to Achieve Goals: Good Progress towards PT goals: Progressing toward goals    Frequency    Min 3X/week      PT Plan Current plan remains appropriate    Co-evaluation              AM-PAC PT "6 Clicks" Mobility  Outcome Measure  Help needed turning from your back to your side while in a flat bed without using bedrails?: None Help needed moving from lying on your back to sitting on the side of a flat bed without using bedrails?: None Help needed moving to and from a bed to a chair (including a wheelchair)?: A Little Help needed standing up from a chair using your arms (e.g., wheelchair or bedside chair)?: A Little Help needed to walk in hospital room?: A Little Help needed climbing 3-5 steps with a railing? : A Little 6 Click Score: 20    End of Session Equipment Utilized During Treatment: Gait belt Activity Tolerance: Patient limited by fatigue Patient left: in bed;with call bell/phone within reach;with bed alarm set;with family/visitor present Nurse Communication: Mobility status PT Visit Diagnosis: Other abnormalities of gait and  mobility (R26.89);Muscle weakness (generalized) (M62.81)     Time: [id number] PT Time Calculation (min) (ACUTE ONLY): 14 min  Charges:  $Gait Training: 8-22 mins                     [name] , PTA Acute Rehabilitation Services Pager [phone] Office [phone]     Cami Delawder [name] 11/26/2019, 4:23 PM

## 2019-11-26 NOTE — Progress Notes (Signed)
Clarification: 5 total plasmapheresis treatments. Frequency is QOD.   Electronically signed: Dr. Kerney Elbe

## 2019-11-27 DIAGNOSIS — H9113 Presbycusis, bilateral: Secondary | ICD-10-CM | POA: Diagnosis present

## 2019-11-27 DIAGNOSIS — I10 Essential (primary) hypertension: Secondary | ICD-10-CM

## 2019-11-27 DIAGNOSIS — R531 Weakness: Secondary | ICD-10-CM

## 2019-11-27 DIAGNOSIS — I350 Nonrheumatic aortic (valve) stenosis: Secondary | ICD-10-CM

## 2019-11-27 DIAGNOSIS — E43 Unspecified severe protein-calorie malnutrition: Secondary | ICD-10-CM

## 2019-11-27 LAB — COMPREHENSIVE METABOLIC PANEL
ALT: 10 U/L (ref 0–44)
AST: 20 U/L (ref 15–41)
Albumin: 3.4 g/dL — ABNORMAL LOW (ref 3.5–5.0)
Alkaline Phosphatase: 32 U/L — ABNORMAL LOW (ref 38–126)
Anion gap: 12 (ref 5–15)
BUN: 29 mg/dL — ABNORMAL HIGH (ref 8–23)
CO2: 25 mmol/L (ref 22–32)
Calcium: 9.8 mg/dL (ref 8.9–10.3)
Chloride: 103 mmol/L (ref 98–111)
Creatinine, Ser: 1.64 mg/dL — ABNORMAL HIGH (ref 0.61–1.24)
GFR calc Af Amer: 43 mL/min — ABNORMAL LOW (ref >60)
GFR calc non Af Amer: 37 mL/min — ABNORMAL LOW (ref >60)
Glucose, Bld: 97 mg/dL (ref 70–99)
Potassium: 3.9 mmol/L (ref 3.5–5.1)
Sodium: 140 mmol/L (ref 135–145)
Total Bilirubin: 1.3 mg/dL — ABNORMAL HIGH (ref 0.3–1.2)
Total Protein: 5 g/dL — ABNORMAL LOW (ref 6.5–8.1)

## 2019-11-27 LAB — CBC WITH DIFFERENTIAL/PLATELET
Abs Immature Granulocytes: 0.02 10*3/uL (ref 0.00–0.07)
Basophils Absolute: 0 10*3/uL (ref 0.0–0.1)
Basophils Relative: 1 %
Eosinophils Absolute: 0.2 10*3/uL (ref 0.0–0.5)
Eosinophils Relative: 3 %
HCT: 45 % (ref 39.0–52.0)
Hemoglobin: 15 g/dL (ref 13.0–17.0)
Immature Granulocytes: 0 %
Lymphocytes Relative: 20 %
Lymphs Abs: 1.2 10*3/uL (ref 0.7–4.0)
MCH: 30.5 pg (ref 26.0–34.0)
MCHC: 33.3 g/dL (ref 30.0–36.0)
MCV: 91.5 fL (ref 80.0–100.0)
Monocytes Absolute: 0.6 10*3/uL (ref 0.1–1.0)
Monocytes Relative: 10 %
Neutro Abs: 4.1 10*3/uL (ref 1.7–7.7)
Neutrophils Relative %: 66 %
Platelets: 82 10*3/uL — ABNORMAL LOW (ref 150–400)
RBC: 4.92 MIL/uL (ref 4.22–5.81)
RDW: 13.2 % (ref 11.5–15.5)
WBC: 6.2 10*3/uL (ref 4.0–10.5)
nRBC: 0 % (ref 0.0–0.2)

## 2019-11-27 LAB — SARS CORONAVIRUS 2 (TAT 6-24 HRS): SARS Coronavirus 2: NEGATIVE

## 2019-11-27 LAB — PROTIME-INR
INR: 1.2 (ref 0.8–1.2)
Prothrombin Time: 14.8 seconds (ref 11.4–15.2)

## 2019-11-27 MED ORDER — ACD FORMULA A 0.73-2.45-2.2 GM/100ML VI SOLN
Status: AC
  Administered 2019-11-27: 500 mL via INTRAVENOUS
  Filled 2019-11-27: qty 500

## 2019-11-27 MED ORDER — HEPARIN SODIUM (PORCINE) 1000 UNIT/ML IJ SOLN
1000.0000 [IU] | Freq: Once | INTRAMUSCULAR | Status: AC
  Filled 2019-11-27: qty 1

## 2019-11-27 MED ORDER — DIPHENHYDRAMINE HCL 25 MG PO CAPS: 25.0000 mg | ORAL_CAPSULE | Freq: Four times a day (QID) | ORAL | Status: DC | PRN

## 2019-11-27 MED ORDER — SODIUM CHLORIDE 0.9 % IV SOLN
2.0000 g | Freq: Once | INTRAVENOUS | Status: AC
  Administered 2019-11-27: 16:00:00 2 g via INTRAVENOUS
  Filled 2019-11-27: qty 20

## 2019-11-27 MED ORDER — HEPARIN SODIUM (PORCINE) 1000 UNIT/ML IJ SOLN
INTRAMUSCULAR | Status: AC
  Administered 2019-11-27: 18:00:00 3200 [IU]
  Filled 2019-11-27: qty 4

## 2019-11-27 MED ORDER — CALCIUM CARBONATE ANTACID 500 MG PO CHEW
CHEWABLE_TABLET | ORAL | Status: AC
  Administered 2019-11-27: 16:00:00 400 mg via ORAL
  Filled 2019-11-27: qty 4

## 2019-11-27 MED ORDER — CALCIUM CARBONATE ANTACID 500 MG PO CHEW
2.0000 | CHEWABLE_TABLET | ORAL | Status: AC
  Filled 2019-11-27: qty 2

## 2019-11-27 MED ORDER — ACD FORMULA A 0.73-2.45-2.2 GM/100ML VI SOLN
Status: AC
  Administered 2019-11-27: 15:00:00 500 mL via INTRAVENOUS
  Filled 2019-11-27: qty 500

## 2019-11-27 MED ORDER — ACETAMINOPHEN 325 MG PO TABS: 650.0000 mg | ORAL_TABLET | ORAL | Status: DC | PRN

## 2019-11-27 MED ORDER — SODIUM CHLORIDE 0.9 % IV SOLN
INTRAVENOUS | Status: AC
  Filled 2019-11-27 (×4): qty 200

## 2019-11-27 MED ORDER — ACD FORMULA A 0.73-2.45-2.2 GM/100ML VI SOLN
500.0000 mL | Status: DC
  Filled 2019-11-27 (×2): qty 500

## 2019-11-27 NOTE — Progress Notes (Signed)
PROGRESS NOTE  SHOMARI MCGLONE [id number] DOB: [birth year] DOA: 11/24/2019 PCP: [name], MD  Hospital Course/Subjective: TEE NORTHUP is a 86 y.o. male with medical history significant of myasthenia gravis, severe aortic stenosis hypertension, hypothyroidism presented with increasing generalized weakness, trouble swallowing, weight loss. Seen by neurology who recommends NG until swallowing better, plasma exchange for 5 total doses with treatment every other day. Had first exchange 2/3. Assessment/Plan: Active Problems:   Myasthenia gravis ([place])  Myasthenia gravis exacerbation Neurology following. Tunneled cath placed by IR 2/3. Continue plasma exchange for 5 total doses QOD, first dose on 11/25/19, dose #2 today Continue his home medications, holding ACE.  Dysphagia Likley related to the worsening MG from not coherent with his meds. But other etiology may need to be ruled out, such as severe aortic stenosis can also possibly cause dysphagia. Liquid diet plus Ensure supplements for now Neuro recommends consider NG tube for PO meds, SLP has seen and recommends a noted.  Generalized weakness Likely related to MG exacerbation, again other etiologies should be considered such as severe aortic stenosis, ordered a echocardiogram, has very severe aortic stenosis. PT/OT recommended HHC  Severe aortic stenosis, as above. Monitor patient volume status. Valvular cardiology to follow up  Severe protein calorie malnutrition From dysphagia, as above. Nutrition consult for supplement recommendation  HTN, continue home meds.  Hypothyroidism, on Synthroid   DVT prophylaxis: Heparin subcu Code Status: Full code Family Communication: No family present this AM  Disposition Plan: Probably home with services. Consults called: Neurology Dr. Cheral Marker Admission status: Telemetry admission   Objective: Vitals:   11/26/19 1620 11/26/19 2004 11/27/19 0009 11/27/19 0417  BP:  120/61 128/65 (!) 110/55 (!) 105/50  Pulse: 61 67 70 64  Resp: 16 18 17 18   Temp: 97.7 F (36.5 C) (!) 97.4 F (36.3 C) 97.8 F (36.6 C) 97.8 F (36.6 C)  TempSrc: Axillary Oral Oral Oral  SpO2: 99% 98% 98% 96%  Weight:      Height:        Intake/Output Summary (Last 24 hours) at 11/27/2019 0810 Last data filed at 11/27/2019 0600 Gross per 24 hour  Intake 267 ml  Output 100 ml  Net 167 ml   Filed Weights   11/25/19 1033  Weight: 60.4 kg     Exam: General:  Alert, oriented, calm, thin and malnourished, in no acute distress, hard of hearing Eyes: EOMI, clear sclerea Neck: supple, no masses, trachea mildline  Cardiovascular: RRR, 3/6 systolic murmur, no peripheral edema  Respiratory: clear to auscultation bilaterally, no wheezes, no crackles  Abdomen: soft, nontender, nondistended, normal bowel tones heard  Skin: dry, no rashes  Musculoskeletal: no joint effusions, normal range of motion  Psychiatric: appropriate affect, normal speech  Neurologic: extraocular muscles intact, clear speech, moving all extremities with intact sensorium but globally weak   Data Reviewed: CBC: Recent Labs  Lab 11/25/19 0226 11/26/19 0445 11/27/19 0358  WBC 6.3 6.6 6.2  NEUTROABS 4.3 4.5 4.1  HGB 14.6 15.4 15.0  HCT 43.8 46.6 45.0  MCV 93.0 93.4 91.5  PLT 94* 86* 82*   Basic Metabolic Panel: Recent Labs  Lab 11/24/19 2131 11/25/19 0226 11/25/19 1238 11/26/19 0445 11/27/19 0358  NA 139 141 139 141 140  K 3.8 3.9 4.0 4.3 3.9  CL 103 105 103 109 103  CO2 26 25 29 25 25   GLUCOSE 158* 78 126* 103* 97  BUN 32* 32* 30* 27* 29*  CREATININE 2.09* 2.04* 1.88* 1.78* 1.64*  CALCIUM 9.7 9.7 9.8 9.6 9.8   GFR: Estimated Creatinine Clearance: 26.6 mL/min (A) (by C-G formula based on SCr of 1.64 mg/dL (H)). Liver Function Tests: Recent Labs  Lab 11/24/19 2131 11/25/19 0226 11/26/19 0445 11/27/19 0358  AST 21 19 11* 20  ALT 15 15 8 10   ALKPHOS 55 55 23* 32*  BILITOT 1.0 1.0 1.4*  1.3*  PROT 5.5* 5.8* 4.7* 5.0*  ALBUMIN 2.9* 3.0* 3.7 3.4*   No results for input(s): LIPASE, AMYLASE in the last 168 hours. No results for input(s): AMMONIA in the last 168 hours. Coagulation Profile: Recent Labs  Lab 11/25/19 0226 11/26/19 0445 11/27/19 0358  INR 1.1 1.5* 1.2   Cardiac Enzymes: No results for input(s): CKTOTAL, CKMB, CKMBINDEX, TROPONINI in the last 168 hours. BNP (last 3 results) No results for input(s): PROBNP in the last 8760 hours. HbA1C: No results for input(s): HGBA1C in the last 72 hours. CBG: No results for input(s): GLUCAP in the last 168 hours. Lipid Profile: No results for input(s): CHOL, HDL, LDLCALC, TRIG, CHOLHDL, LDLDIRECT in the last 72 hours. Thyroid Function Tests: Recent Labs    11/24/19 1500  TSH 2.975   Anemia Panel: No results for input(s): VITAMINB12, FOLATE, FERRITIN, TIBC, IRON, RETICCTPCT in the last 72 hours. Urine analysis:    Component Value Date/Time   COLORURINE YELLOW 05/02/2018 1750   APPEARANCEUR HAZY (A) 05/02/2018 1750   LABSPEC 1.018 05/02/2018 1750   PHURINE 5.0 05/02/2018 1750   GLUCOSEU NEGATIVE 05/02/2018 1750   HGBUR NEGATIVE 05/02/2018 1750   BILIRUBINUR NEGATIVE 05/02/2018 1750   KETONESUR NEGATIVE 05/02/2018 1750   PROTEINUR NEGATIVE 05/02/2018 1750   NITRITE NEGATIVE 05/02/2018 1750   LEUKOCYTESUR NEGATIVE 05/02/2018 1750   Sepsis Labs: @LABRCNTIP (procalcitonin:4,lacticidven:4)  ) Recent Results (from the past 240 hour(s))  SARS CORONAVIRUS 2 (TAT 6-24 HRS) Nasopharyngeal Nasopharyngeal Swab     Status: None   Collection Time: 11/24/19  3:12 PM   Specimen: Nasopharyngeal Swab  Result Value Ref Range Status   SARS Coronavirus 2 NEGATIVE NEGATIVE Final    Comment: (NOTE) SARS-CoV-2 target nucleic acids are NOT DETECTED. The SARS-CoV-2 RNA is generally detectable in upper and lower respiratory specimens during the acute phase of infection. Negative results do not preclude SARS-CoV-2 infection,  do not rule out co-infections with other pathogens, and should not be used as the sole basis for treatment or other patient management decisions. Negative results must be combined with clinical observations, patient history, and epidemiological information. The expected result is Negative. Fact Sheet for Patients: SugarRoll.be Fact Sheet for Healthcare Providers: https://www.woods-mathews.com/ This test is not yet approved or cleared by the [place] FDA and  has been authorized for detection and/or diagnosis of SARS-CoV-2 by FDA under an Emergency Use Authorization (EUA). This EUA will remain  in effect (meaning this test can be used) for the duration of the COVID-19 declaration under Section 56 4(b)(1) of the Act, 21 U.S.C. section 360bbb-3(b)(1), unless the authorization is terminated or revoked sooner. Performed at [facility], [place] [address], [place], [place] [address]      Studies: DG Swallowing Func-Speech Pathology  Result Date: 11/26/2019 Objective Swallowing Evaluation: Type of Study: MBS-Modified Barium Swallow Study  Patient Details Name: TAY HUWE MRN: [id number] Date of Birth: June 09, 1929 Today's Date: 11/26/2019 Time: SLP Start Time (ACUTE ONLY): [id number] -SLP Stop Time (ACUTE ONLY): [id number] SLP Time Calculation (min) (ACUTE ONLY): 18 min Past Medical History: Past Medical History: Diagnosis Date . CN (constipation)  .  Elevated transaminase level  . History of measles  . Hypertension  . Hypothyroidism  . Myasthenia gravis ([place])  . Myasthenia gravis ([place])  . Prostate cancer [facility])   Prostatectomy 2001 . Severe aortic stenosis  Past Surgical History: Past Surgical History: Procedure Laterality Date . APPENDECTOMY  1955 . HERNIA REPAIR Right 2006  Dr. Jamal Collin . IR FLUORO GUIDE CV LINE LEFT  05/03/2018 . IR FLUORO GUIDE CV LINE RIGHT  11/25/2019 . IR US GUIDE VASC ACCESS RIGHT  05/03/2018 . IR US GUIDE VASC ACCESS RIGHT  11/25/2019 . PROSTATECTOMY   2001  Dr. Yves Dill HPI: Pt is an 86 y.o. male with medical history significant of myasthenia gravis, severe aortic stenosis hypertension, hypothyroidism who presented with increasing generalized weakness, trouble swallowing, weight loss. Chest x-ray of 11/24/19 was stable without evidence of acute or active cardiopulmonary disease.  Subjective: Pt was pleasant and alert Assessment / Plan / Recommendation CHL IP CLINICAL IMPRESSIONS 11/26/2019 Clinical Impression  Pt was seen for a modified barium swallow study and he presents with mild oropharyngeal dysphagia with resultant trace laryngeal penetration of thin liquid via straw sip on today's examination.  Laryngeal penetration was observed x1 during serial straw sips of thin liquid.  No aspiration was observed with any trials and no laryngeal penetration was observed with puree, soft solids, or cup sips of thin liquid despite challenging.  Oral phase was remarkable for moderately prolonged mastication and AP transport of soft solids, reduced lingual strength resulting in trace oral residue, and reduced lingual control resulting in premature spillage to the pharynx.  Pharyngeal phase was remarkable for reduced BOT retraction and reduced hyolaryngeal elevation/excursion, resulting in mild vallecular and pyriform residue.  Pt appeared sensate to the residue and he was able to clear the majority of it given a spontaneous second swallow across trials.  Recommend continuation of Dysphagia 2 (fine chop) solids and thin liquids with medications whole or crushed in puree (per pt preference) and intermittent superversion to cue for the following compensatory strategies: 1) Small bites/sips 2) Slow rate of intake 3) Sit upright as possible.  SLP Visit Diagnosis Dysphagia, oropharyngeal phase (R13.12) Attention and concentration deficit following -- Frontal lobe and executive function deficit following -- Impact on safety and function Mild aspiration risk   CHL IP TREATMENT  RECOMMENDATION 11/26/2019 Treatment Recommendations Therapy as outlined in treatment plan below   Prognosis 11/26/2019 Prognosis for Safe Diet Advancement Good Barriers to Reach Goals -- Barriers/Prognosis Comment -- CHL IP DIET RECOMMENDATION 11/26/2019 SLP Diet Recommendations Dysphagia 2 (Fine chop) solids;Thin liquid Liquid Administration via Cup Medication Administration Whole meds with puree Compensations Small sips/bites;Slow rate;Multiple dry swallows after each bite/sip;Minimize environmental distractions Postural Changes Seated upright at 90 degrees   CHL IP OTHER RECOMMENDATIONS 11/26/2019 Recommended Consults -- Oral Care Recommendations Oral care BID Other Recommendations --   CHL IP FOLLOW UP RECOMMENDATIONS 11/26/2019 Follow up Recommendations Home health SLP   CHL IP FREQUENCY AND DURATION 11/26/2019 Speech Therapy Frequency (ACUTE ONLY) min 2x/week Treatment Duration 2 weeks      CHL IP ORAL PHASE 11/26/2019 Oral Phase Impaired Oral - Pudding Teaspoon -- Oral - Pudding Cup -- Oral - Honey Teaspoon -- Oral - Honey Cup -- Oral - Nectar Teaspoon -- Oral - Nectar Cup -- Oral - Nectar Straw -- Oral - Thin Teaspoon Piecemeal swallowing;Premature spillage Oral - Thin Cup Piecemeal swallowing;Lingual/palatal residue;Premature spillage Oral - Thin Straw Premature spillage;Lingual/palatal residue Oral - Puree Piecemeal swallowing;Decreased bolus cohesion;Delayed oral transit;Lingual/palatal residue Oral - Mech  Soft Impaired mastication;Delayed oral transit;Lingual/palatal residue Oral - Regular -- Oral - Multi-Consistency -- Oral - Pill -- Oral Phase - Comment --  CHL IP PHARYNGEAL PHASE 11/26/2019 Pharyngeal Phase Impaired Pharyngeal- Pudding Teaspoon -- Pharyngeal -- Pharyngeal- Pudding Cup -- Pharyngeal -- Pharyngeal- Honey Teaspoon -- Pharyngeal -- Pharyngeal- Honey Cup -- Pharyngeal -- Pharyngeal- Nectar Teaspoon -- Pharyngeal -- Pharyngeal- Nectar Cup -- Pharyngeal -- Pharyngeal- Nectar Straw -- Pharyngeal --  Pharyngeal- Thin Teaspoon Delayed swallow initiation-pyriform sinuses;Reduced anterior laryngeal mobility;Reduced laryngeal elevation;Reduced tongue base retraction;Pharyngeal residue - valleculae;Pharyngeal residue - pyriform Pharyngeal Material does not enter airway Pharyngeal- Thin Cup Delayed swallow initiation-pyriform sinuses;Reduced anterior laryngeal mobility;Reduced laryngeal elevation;Reduced tongue base retraction;Pharyngeal residue - valleculae;Pharyngeal residue - pyriform Pharyngeal Material does not enter airway Pharyngeal- Thin Straw Delayed swallow initiation-pyriform sinuses;Reduced epiglottic inversion;Reduced anterior laryngeal mobility;Reduced laryngeal elevation;Reduced airway/laryngeal closure;Reduced tongue base retraction;Penetration/Aspiration during swallow;Pharyngeal residue - valleculae;Pharyngeal residue - pyriform Pharyngeal Material enters airway, remains ABOVE vocal cords and not ejected out Pharyngeal- Puree Delayed swallow initiation-vallecula;Reduced anterior laryngeal mobility;Reduced laryngeal elevation;Reduced tongue base retraction;Pharyngeal residue - pyriform;Pharyngeal residue - valleculae Pharyngeal Material does not enter airway Pharyngeal- Mechanical Soft Delayed swallow initiation-vallecula;Reduced anterior laryngeal mobility;Reduced laryngeal elevation;Reduced tongue base retraction;Reduced pharyngeal peristalsis;Pharyngeal residue - valleculae;Pharyngeal residue - pyriform;Lateral channel residue Pharyngeal Material does not enter airway Pharyngeal- Regular -- Pharyngeal -- Pharyngeal- Multi-consistency -- Pharyngeal -- Pharyngeal- Pill -- Pharyngeal -- Pharyngeal Comment --  No flowsheet data found. Colin Mulders., M.S., [place] Acute Rehabilitation Services Office: [phone] [place] 11/26/2019, 2:59 PM               Scheduled Meds: . aspirin EC  81 mg Oral Daily  . Chlorhexidine Gluconate Cloth  6 each Topical Daily  . feeding supplement (ENSURE ENLIVE)   237 mL Oral TID BM  . heparin  5,000 Units Subcutaneous Q12H  . hydrochlorothiazide  12.5 mg Oral Daily  . levothyroxine  50 mcg Oral Daily  . multivitamin with minerals  1 tablet Oral Daily  . mycophenolate  250 mg Oral BID  . rosuvastatin  20 mg Oral Daily    Continuous Infusions:   LOS: 3 days   Time spent: 25 minutes  Paticia Stack, MD [facility] Pager [phone]  If 7PM-7AM, please contact night-coverage [email or web address] Password [email or web address] 11/27/2019, 8:10 AM

## 2019-11-27 NOTE — Progress Notes (Addendum)
  Speech Language Pathology Treatment: Dysphagia  Patient Details Name: Jason Riley MRN: [id number] DOB: May 28, 1929 Today's Date: 11/27/2019 Time:  -     Assessment / Plan / Recommendation Clinical Impression  Pt received at bedside for skilled ST targeting dysphagia. Patient seen with lunch tray, dysphagia 2 solids and thin liquids. Pt able to feed self with set-up assistance. Patient noted to have mildly prolonged mastication and oral transit of dysphagia 2 solids, adequate oral clearance and no overt s/sx aspiration after the swallow. No overt s/sx aspiration seen with straw sips of thin liquids. ST trialed chopped peaches: remarkable for mildly prolonged mastication and prolonged AP transfer. Swallow initiation appeared timely based on clinical presentation. Min residue after the swallow that patient was able to clear with subsequent swallows. No overt s/sx aspiration. ST educated pt re: results from his participation in [place] yesterday, he nodded. He reports he hasn't had dentures in "many years" and typically avoids foods that are too difficult to masticate at home. Patient reports a desire to have upgraded solids.  Recommend upgrade to dysphagia 3 solids/thin liquids with: supervision to cue for strategies, small bites/sips, eating while sitting upright, oral care BID. D/w RN re: same, she verbalized understanding. ST to follow as per POC.   HPI HPI: Pt is an 86 y.o. male with medical history significant of myasthenia gravis, severe aortic stenosis hypertension, hypothyroidism who presented with increasing generalized weakness, trouble swallowing, weight loss. Chest x-ray of 11/24/19 was stable without evidence of acute or active cardiopulmonary disease.      SLP Plan  Continue with current plan of care       Recommendations  Dysphagia 3 (soft solids), thin liquids                Plan: Continue with current plan of care       [place] 11/27/2019, 3:35  PM  Marina Goodell, M.Ed., [place] [facility] Acute Rehabilitation [phone]: Acute Rehab office [phone] - pager

## 2019-11-27 NOTE — Progress Notes (Signed)
Physical Therapy Treatment Patient Details Name: Jason Riley MRN: [id number] DOB: 07-Sep-1929 Today's Date: 11/27/2019    History of Present Illness Pt is an 86 y/o male admittes secondary to increased weakness, trouble swallowing and weight loss. Thought to be secondary to myasthenia gravis flare. PMH includes myasthenia gravis, aortic stenosis, HTN, and prostate cancer.     PT Comments    Pt trialed gt without device and minor instability.  Continue to recommend HHPT.     Follow Up Recommendations  Home health PT;Supervision for mobility/OOB     Equipment Recommendations  Other (comment)    Recommendations for Other Services       Precautions / Restrictions Precautions Precautions: Fall Restrictions Weight Bearing Restrictions: No    Mobility  Bed Mobility Overal bed mobility: Needs Assistance Bed Mobility: Supine to Sit;Sit to Supine     Supine to sit: Supervision Sit to supine: Supervision   General bed mobility comments: Supervision for safety   Transfers Overall transfer level: Needs assistance Equipment used: Rolling walker (2 wheeled) Transfers: Sit to/from Stand Sit to Stand: Supervision            Ambulation/Gait Ambulation/Gait assistance: Supervision;Min guard Gait Distance (Feet): 140 Feet(last 40 ft of gt trial without device.) Assistive device: None;Rolling walker (2 wheeled) Gait Pattern/deviations: Step-through pattern;Trunk flexed;Decreased stride length Gait velocity: Decreased   General Gait Details: Cues for upper trunk control.  Mild unsteadiness remains and utilized RW for part of session.Pt able to ambulate with out device with mild drifting.   Stairs             Wheelchair Mobility    Modified Rankin (Stroke Patients Only)       Balance Overall balance assessment: Needs assistance   Sitting balance-Leahy Scale: Good       Standing balance-Leahy Scale: Fair                              Cognition  Arousal/Alertness: Awake/alert Behavior During Therapy: WFL for tasks assessed/performed Overall Cognitive Status: Within Functional Limits for tasks assessed                                        Exercises General Exercises - Lower Extremity Ankle Circles/Pumps: AROM;Both;10 reps;Supine Quad Sets: AROM;Both;10 reps;Supine Heel Slides: AROM;Both;10 reps;Supine Hip ABduction/ADduction: AROM;Both;10 reps;Supine    General Comments        Pertinent Vitals/Pain      Home Living                      Prior Function            PT Goals (current goals can now be found in the care plan section) Acute Rehab PT Goals Patient Stated Goal: not stated.  Potential to Achieve Goals: Good Progress towards PT goals: Progressing toward goals    Frequency    Min 3X/week      PT Plan Current plan remains appropriate    Co-evaluation              AM-PAC PT "6 Clicks" Mobility   Outcome Measure  Help needed turning from your back to your side while in a flat bed without using bedrails?: None Help needed moving from lying on your back to sitting on the side of a flat bed without using bedrails?: None  Help needed moving to and from a bed to a chair (including a wheelchair)?: A Little Help needed standing up from a chair using your arms (e.g., wheelchair or bedside chair)?: A Little Help needed to walk in hospital room?: A Little Help needed climbing 3-5 steps with a railing? : A Little 6 Click Score: 20    End of Session Equipment Utilized During Treatment: Gait belt Activity Tolerance: Patient limited by fatigue Patient left: in bed;with call bell/phone within reach;with bed alarm set;with family/visitor present Nurse Communication: Mobility status PT Visit Diagnosis: Other abnormalities of gait and mobility (R26.89);Muscle weakness (generalized) (M62.81)     Time: [id number] PT Time Calculation (min) (ACUTE ONLY): 24 min  Charges:  $Gait  Training: 8-22 mins $Therapeutic Exercise: 8-22 mins                     [name] , PTA Acute Rehabilitation Services Pager [phone] Office [phone]     Jason Riley Jason Riley 11/27/2019, 5:58 PM

## 2019-11-27 NOTE — Progress Notes (Signed)
Occupational Therapy Treatment Patient Details Name: Jason Riley MRN: [id number] DOB: [birth year] Today's Date: 11/27/2019    History of present illness Pt is an 86 y/o male admittes secondary to increased weakness, trouble swallowing and weight loss. Thought to be secondary to myasthenia gravis flare. PMH includes myasthenia gravis, aortic stenosis, HTN, and prostate cancer.    OT comments  Pt. Seen for skilled OT treatment session.  Declined oob this am.  agreeable to bue there ex for continued strengthening for functional mobility and adl completion.  Follow Up Recommendations  Home health OT;Supervision - Intermittent    Equipment Recommendations  None recommended by OT    Recommendations for Other Services      Precautions / Restrictions Precautions Precautions: Fall       Mobility Bed Mobility                  Transfers                      Balance                                           ADL either performed or assessed with clinical judgement   ADL                                         General ADL Comments: pt. declined oob/eob this am.  agreeable to  ther ex for strengthening of B UEs for use during adls and functional mobility     Vision       Perception     Praxis      Cognition Arousal/Alertness: Awake/alert Behavior During Therapy: WFL for tasks assessed/performed Overall Cognitive Status: Within Functional Limits for tasks assessed                                          Exercises General Exercises - Upper Extremity Shoulder Flexion: AROM;Both;10 reps;Supine Shoulder Extension: AROM;Both;10 reps;Supine Shoulder ABduction: AROM;Both;10 reps;Supine Shoulder ADduction: AROM;Both;10 reps;Supine Elbow Flexion: AROM;Both;10 reps;Supine Elbow Extension: AROM;Both;10 reps;Supine Wrist Flexion: AROM;Both;10 reps;Supine Wrist Extension: AROM;Both;10 reps;Supine   Shoulder  Instructions       General Comments  reports loss of appetite started "when all of this started" (referring to covid).  States having to eat in his room alone was a factor in addition to the food usually being cold by the time it got to his room and/or over cooked.  He just "lost interest".       Pertinent Vitals/ Pain       Pain Assessment: No/denies pain  Home Living                                          Prior Functioning/Environment              Frequency  Min 2X/week        Progress Toward Goals  OT Goals(current goals can now be found in the care plan section)  Progress towards OT goals: Progressing toward goals     Plan  Co-evaluation                 AM-PAC OT "6 Clicks" Daily Activity     Outcome Measure   Help from another person eating meals?: None Help from another person taking care of personal grooming?: None Help from another person toileting, which includes using toliet, bedpan, or urinal?: None Help from another person bathing (including washing, rinsing, drying)?: A Little Help from another person to put on and taking off regular upper body clothing?: None Help from another person to put on and taking off regular lower body clothing?: A Little 6 Click Score: 22    End of Session    OT Visit Diagnosis: Unsteadiness on feet (R26.81);Muscle weakness (generalized) (M62.81)   Activity Tolerance Patient tolerated treatment well   Patient Left in bed;with call bell/phone within reach   Nurse Communication          Time: [id number] OT Time Calculation (min): 11 min  Charges: OT General Charges $OT Visit: 1 Visit OT Treatments $Therapeutic Exercise: 8-22 mins  Sonia Baller, COTA/L Acute Rehabilitation [phone]   Janice Coffin 11/27/2019, 12:16 PM

## 2019-11-28 DIAGNOSIS — H9113 Presbycusis, bilateral: Secondary | ICD-10-CM

## 2019-11-28 DIAGNOSIS — N1832 Chronic kidney disease, stage 3b: Secondary | ICD-10-CM

## 2019-11-28 DIAGNOSIS — I2510 Atherosclerotic heart disease of native coronary artery without angina pectoris: Secondary | ICD-10-CM

## 2019-11-28 DIAGNOSIS — E034 Atrophy of thyroid (acquired): Secondary | ICD-10-CM

## 2019-11-28 LAB — COMPREHENSIVE METABOLIC PANEL
ALT: 11 U/L (ref 0–44)
AST: 17 U/L (ref 15–41)
Albumin: 3.9 g/dL (ref 3.5–5.0)
Alkaline Phosphatase: 19 U/L — ABNORMAL LOW (ref 38–126)
Anion gap: 5 (ref 5–15)
BUN: 26 mg/dL — ABNORMAL HIGH (ref 8–23)
CO2: 26 mmol/L (ref 22–32)
Calcium: 9.5 mg/dL (ref 8.9–10.3)
Chloride: 108 mmol/L (ref 98–111)
Creatinine, Ser: 1.66 mg/dL — ABNORMAL HIGH (ref 0.61–1.24)
GFR calc Af Amer: 42 mL/min — ABNORMAL LOW (ref >60)
GFR calc non Af Amer: 36 mL/min — ABNORMAL LOW (ref >60)
Glucose, Bld: 98 mg/dL (ref 70–99)
Potassium: 4.2 mmol/L (ref 3.5–5.1)
Sodium: 139 mmol/L (ref 135–145)
Total Bilirubin: 1.4 mg/dL — ABNORMAL HIGH (ref 0.3–1.2)
Total Protein: 4.5 g/dL — ABNORMAL LOW (ref 6.5–8.1)

## 2019-11-28 LAB — CBC WITH DIFFERENTIAL/PLATELET
Abs Immature Granulocytes: 0.02 10*3/uL (ref 0.00–0.07)
Basophils Absolute: 0 10*3/uL (ref 0.0–0.1)
Basophils Relative: 1 %
Eosinophils Absolute: 0.2 10*3/uL (ref 0.0–0.5)
Eosinophils Relative: 4 %
HCT: 46 % (ref 39.0–52.0)
Hemoglobin: 15.1 g/dL (ref 13.0–17.0)
Immature Granulocytes: 0 %
Lymphocytes Relative: 23 %
Lymphs Abs: 1.4 10*3/uL (ref 0.7–4.0)
MCH: 30.8 pg (ref 26.0–34.0)
MCHC: 32.8 g/dL (ref 30.0–36.0)
MCV: 93.9 fL (ref 80.0–100.0)
Monocytes Absolute: 0.7 10*3/uL (ref 0.1–1.0)
Monocytes Relative: 12 %
Neutro Abs: 3.5 10*3/uL (ref 1.7–7.7)
Neutrophils Relative %: 60 %
Platelets: 83 10*3/uL — ABNORMAL LOW (ref 150–400)
RBC: 4.9 MIL/uL (ref 4.22–5.81)
RDW: 13.3 % (ref 11.5–15.5)
WBC: 5.8 10*3/uL (ref 4.0–10.5)
nRBC: 0 % (ref 0.0–0.2)

## 2019-11-28 LAB — PROTIME-INR
INR: 1.5 — ABNORMAL HIGH (ref 0.8–1.2)
Prothrombin Time: 17.8 seconds — ABNORMAL HIGH (ref 11.4–15.2)

## 2019-11-28 MED ORDER — MAGNESIUM HYDROXIDE 400 MG/5ML PO SUSP
5.0000 mL | Freq: Every day | ORAL | Status: DC
  Administered 2019-11-28 – 2019-12-04 (×7): 5 mL via ORAL
  Filled 2019-11-28 (×7): qty 30

## 2019-11-28 MED ORDER — SENNOSIDES-DOCUSATE SODIUM 8.6-50 MG PO TABS
1.0000 | ORAL_TABLET | Freq: Two times a day (BID) | ORAL | Status: DC
  Administered 2019-11-28 – 2019-12-03 (×11): 1 via ORAL
  Filled 2019-11-28 (×10): qty 1

## 2019-11-28 NOTE — Progress Notes (Signed)
PROGRESS NOTE  Jason Riley [id number] DOB: [birth year] DOA: 11/24/2019 PCP: Jason Sons, MD  Hospital Course/Subjective: Jason Riley is a 86 y.o. male with medical history significant of myasthenia gravis, severe aortic stenosis hypertension, hypothyroidism presented with increasing generalized weakness, trouble swallowing, weight loss. Seen by neurology who recommends NG until swallowing better, plasma exchange for 5 total doses with treatment every other day. Had first exchange 2/3.  Assessment/Plan: Active Problems:   Myasthenia gravis ([place])  Myasthenia gravis exacerbation Neurology following. Tunneled cath placed by IR 2/3. Continue plasma exchange for 5 total doses QOD, first dose on 11/25/19, dose #2 on 11/27/19 Check NIF Continue his home medications, holding ACE.  Dysphagia Likley related to the worsening MG from not coherent with his meds. But other etiology may need to be ruled out, such as severe aortic stenosis can also possibly cause dysphagia. Liquid diet plus Ensure supplements for now Neuro recommends consider NG tube for PO meds, SLP has seen and recommends a noted.  Generalized weakness Likely related to MG exacerbation, again other etiologies should be considered such as severe aortic stenosis, ordered a echocardiogram, has very severe aortic stenosis. PT/OT recommended HHC PT  Severe aortic stenosis, as above. Monitor patient volume status. Valvular cardiology to follow up  Severe protein calorie malnutrition From dysphagia, as above. Nutrition consult for supplement recommendation  HTN, continue home meds.  Hypothyroidism, on Synthroid   DVT prophylaxis: Heparin subcu Code Status: Full code Family Communication: No family present this AM  Disposition Plan: Probably home with services. Consults called: Neurology Dr. Cheral Marker Admission status: Telemetry admission   Subjective: no overnight acute issues. Received 2nd dose of  plasmapharesis yesterday. No SOB but just generalized weakness  Objective: Vitals:   11/27/19 1931 11/27/19 2346 11/28/19 0436 11/28/19 0700  BP: (!) 97/54 (!) 114/54 (!) 112/55 (!) 112/54  Pulse: 63 66 61 (!) 57  Resp: 18 16 18 19   Temp: (!) 97.4 F (36.3 C) 98.7 F (37.1 C) 97.7 F (36.5 C) (!) 97.5 F (36.4 C)  TempSrc: Oral Oral Oral Oral  SpO2: 98% 98% 98% 98%  Weight:      Height:        Intake/Output Summary (Last 24 hours) at 11/28/2019 0804 Last data filed at 11/28/2019 0703 Gross per 24 hour  Intake 180 ml  Output 420 ml  Net -240 ml   Filed Weights   11/25/19 1033  Weight: 60.4 kg     Exam: General:  Alert, oriented, calm, thin and malnourished, in no acute distress, hard of hearing Eyes: EOMI, clear sclerea Neck: supple, no masses, trachea mildline  Cardiovascular: RRR, 3/6 systolic murmur, no peripheral edema  Respiratory: clear to auscultation bilaterally, no wheezes, no crackles  Abdomen: soft, nontender, nondistended, normal bowel tones heard  Skin: dry, no rashes  Musculoskeletal: no joint effusions, normal range of motion  Psychiatric: appropriate affect, normal speech  Neurologic: extraocular muscles intact, clear speech, moving all extremities with intact sensorium but globally weak   Data Reviewed: CBC: Recent Labs  Lab 11/25/19 0226 11/26/19 0445 11/27/19 0358 11/28/19 0522  WBC 6.3 6.6 6.2 5.8  NEUTROABS 4.3 4.5 4.1 3.5  HGB 14.6 15.4 15.0 15.1  HCT 43.8 46.6 45.0 46.0  MCV 93.0 93.4 91.5 93.9  PLT 94* 86* 82* 83*   Basic Metabolic Panel: Recent Labs  Lab 11/25/19 0226 11/25/19 1238 11/26/19 0445 11/27/19 0358 11/28/19 0522  NA 141 139 141 140 139  K 3.9 4.0 4.3 3.9 4.2  CL 105 103 109 103 108  CO2 25 29 25 25 26   GLUCOSE 78 126* 103* 97 98  BUN 32* 30* 27* 29* 26*  CREATININE 2.04* 1.88* 1.78* 1.64* 1.66*  CALCIUM 9.7 9.8 9.6 9.8 9.5   GFR: Estimated Creatinine Clearance: 26.3 mL/min (A) (by C-G formula based on SCr of  1.66 mg/dL (H)). Liver Function Tests: Recent Labs  Lab 11/24/19 2131 11/25/19 0226 11/26/19 0445 11/27/19 0358 11/28/19 0522  AST 21 19 11* 20 17  ALT 15 15 8 10 11   ALKPHOS 55 55 23* 32* 19*  BILITOT 1.0 1.0 1.4* 1.3* 1.4*  PROT 5.5* 5.8* 4.7* 5.0* 4.5*  ALBUMIN 2.9* 3.0* 3.7 3.4* 3.9   No results for input(s): LIPASE, AMYLASE in the last 168 hours. No results for input(s): AMMONIA in the last 168 hours. Coagulation Profile: Recent Labs  Lab 11/25/19 0226 11/26/19 0445 11/27/19 0358 11/28/19 0522  INR 1.1 1.5* 1.2 1.5*   Cardiac Enzymes: No results for input(s): CKTOTAL, CKMB, CKMBINDEX, TROPONINI in the last 168 hours. BNP (last 3 results) No results for input(s): PROBNP in the last 8760 hours. HbA1C: No results for input(s): HGBA1C in the last 72 hours. CBG: No results for input(s): GLUCAP in the last 168 hours. Lipid Profile: No results for input(s): CHOL, HDL, LDLCALC, TRIG, CHOLHDL, LDLDIRECT in the last 72 hours. Thyroid Function Tests: No results for input(s): TSH, T4TOTAL, FREET4, T3FREE, THYROIDAB in the last 72 hours. Anemia Panel: No results for input(s): VITAMINB12, FOLATE, FERRITIN, TIBC, IRON, RETICCTPCT in the last 72 hours. Urine analysis:    Component Value Date/Time   COLORURINE YELLOW 05/02/2018 1750   APPEARANCEUR HAZY (A) 05/02/2018 1750   LABSPEC 1.018 05/02/2018 1750   PHURINE 5.0 05/02/2018 1750   GLUCOSEU NEGATIVE 05/02/2018 1750   HGBUR NEGATIVE 05/02/2018 1750   BILIRUBINUR NEGATIVE 05/02/2018 1750   KETONESUR NEGATIVE 05/02/2018 1750   PROTEINUR NEGATIVE 05/02/2018 1750   NITRITE NEGATIVE 05/02/2018 1750   LEUKOCYTESUR NEGATIVE 05/02/2018 1750   Sepsis Labs: @LABRCNTIP (procalcitonin:4,lacticidven:4)  ) Recent Results (from the past 240 hour(s))  SARS CORONAVIRUS 2 (TAT 6-24 HRS) Nasopharyngeal Nasopharyngeal Swab     Status: None   Collection Time: 11/24/19  3:12 PM   Specimen: Nasopharyngeal Swab  Result Value Ref Range  Status   SARS Coronavirus 2 NEGATIVE NEGATIVE Final    Comment: (NOTE) SARS-CoV-2 target nucleic acids are NOT DETECTED. The SARS-CoV-2 RNA is generally detectable in upper and lower respiratory specimens during the acute phase of infection. Negative results do not preclude SARS-CoV-2 infection, do not rule out co-infections with other pathogens, and should not be used as the sole basis for treatment or other patient management decisions. Negative results must be combined with clinical observations, patient history, and epidemiological information. The expected result is Negative. Fact Sheet for Patients: SugarRoll.be Fact Sheet for Healthcare Providers: https://www.woods-mathews.com/ This test is not yet approved or cleared by the [place] FDA and  has been authorized for detection and/or diagnosis of SARS-CoV-2 by FDA under an Emergency Use Authorization (EUA). This EUA will remain  in effect (meaning this test can be used) for the duration of the COVID-19 declaration under Section 56 4(b)(1) of the Act, 21 U.S.C. section 360bbb-3(b)(1), unless the authorization is terminated or revoked sooner. Performed at [facility], [place] [address] [place], [place] [address]   SARS CORONAVIRUS 2 (TAT 6-24 HRS) Nasopharyngeal Nasopharyngeal Swab     Status: None   Collection Time: 11/27/19  9:25 AM  Specimen: Nasopharyngeal Swab  Result Value Ref Range Status   SARS Coronavirus 2 NEGATIVE NEGATIVE Final    Comment: (NOTE) SARS-CoV-2 target nucleic acids are NOT DETECTED. The SARS-CoV-2 RNA is generally detectable in upper and lower respiratory specimens during the acute phase of infection. Negative results do not preclude SARS-CoV-2 infection, do not rule out co-infections with other pathogens, and should not be used as the sole basis for treatment or other patient management decisions. Negative results must be combined with clinical  observations, patient history, and epidemiological information. The expected result is Negative. Fact Sheet for Patients: SugarRoll.be Fact Sheet for Healthcare Providers: https://www.woods-mathews.com/ This test is not yet approved or cleared by the [place] FDA and  has been authorized for detection and/or diagnosis of SARS-CoV-2 by FDA under an Emergency Use Authorization (EUA). This EUA will remain  in effect (meaning this test can be used) for the duration of the COVID-19 declaration under Section 56 4(b)(1) of the Act, 21 U.S.C. section 360bbb-3(b)(1), unless the authorization is terminated or revoked sooner. Performed at [facility], [place] [address] [place], [place] [address]      Studies: No results found.  Scheduled Meds: . aspirin EC  81 mg Oral Daily  . Chlorhexidine Gluconate Cloth  6 each Topical Daily  . feeding supplement (ENSURE ENLIVE)  237 mL Oral TID BM  . heparin  5,000 Units Subcutaneous Q12H  . hydrochlorothiazide  12.5 mg Oral Daily  . levothyroxine  50 mcg Oral Daily  . multivitamin with minerals  1 tablet Oral Daily  . mycophenolate  250 mg Oral BID  . rosuvastatin  20 mg Oral Daily    Continuous Infusions: . citrate dextrose       LOS: 4 days   Time spent: 25 minutes  Paticia Stack, MD [facility] Pager [phone]  If 7PM-7AM, please contact night-coverage [email or web address] Password [email or web address] 11/28/2019, 8:04 AM

## 2019-11-29 LAB — COMPREHENSIVE METABOLIC PANEL
ALT: 20 U/L (ref 0–44)
AST: 31 U/L (ref 15–41)
Albumin: 3.5 g/dL (ref 3.5–5.0)
Alkaline Phosphatase: 30 U/L — ABNORMAL LOW (ref 38–126)
Anion gap: 8 (ref 5–15)
BUN: 31 mg/dL — ABNORMAL HIGH (ref 8–23)
CO2: 26 mmol/L (ref 22–32)
Calcium: 9.3 mg/dL (ref 8.9–10.3)
Chloride: 103 mmol/L (ref 98–111)
Creatinine, Ser: 1.5 mg/dL — ABNORMAL HIGH (ref 0.61–1.24)
GFR calc Af Amer: 47 mL/min — ABNORMAL LOW (ref >60)
GFR calc non Af Amer: 41 mL/min — ABNORMAL LOW (ref >60)
Glucose, Bld: 99 mg/dL (ref 70–99)
Potassium: 4.2 mmol/L (ref 3.5–5.1)
Sodium: 137 mmol/L (ref 135–145)
Total Bilirubin: 1.1 mg/dL (ref 0.3–1.2)
Total Protein: 4.5 g/dL — ABNORMAL LOW (ref 6.5–8.1)

## 2019-11-29 LAB — CBC WITH DIFFERENTIAL/PLATELET
Abs Immature Granulocytes: 0.01 10*3/uL (ref 0.00–0.07)
Basophils Absolute: 0.1 10*3/uL (ref 0.0–0.1)
Basophils Relative: 1 %
Eosinophils Absolute: 0.2 10*3/uL (ref 0.0–0.5)
Eosinophils Relative: 4 %
HCT: 43 % (ref 39.0–52.0)
Hemoglobin: 14.3 g/dL (ref 13.0–17.0)
Immature Granulocytes: 0 %
Lymphocytes Relative: 25 %
Lymphs Abs: 1.4 10*3/uL (ref 0.7–4.0)
MCH: 30.8 pg (ref 26.0–34.0)
MCHC: 33.3 g/dL (ref 30.0–36.0)
MCV: 92.5 fL (ref 80.0–100.0)
Monocytes Absolute: 0.6 10*3/uL (ref 0.1–1.0)
Monocytes Relative: 11 %
Neutro Abs: 3.2 10*3/uL (ref 1.7–7.7)
Neutrophils Relative %: 59 %
Platelets: 80 10*3/uL — ABNORMAL LOW (ref 150–400)
RBC: 4.65 MIL/uL (ref 4.22–5.81)
RDW: 13.3 % (ref 11.5–15.5)
WBC: 5.5 10*3/uL (ref 4.0–10.5)
nRBC: 0 % (ref 0.0–0.2)

## 2019-11-29 LAB — PROTIME-INR
INR: 1.1 (ref 0.8–1.2)
Prothrombin Time: 14.4 seconds (ref 11.4–15.2)

## 2019-11-29 NOTE — Progress Notes (Signed)
Attempted to reach out to Hemodialysis several times in order to get pt over for Plasmapheresis scheduled today. Phone call was never answered. Passe along to night shift nurse.

## 2019-11-29 NOTE — Progress Notes (Addendum)
PROGRESS NOTE  Jason Riley [id number] DOB: 1929-01-16 DOA: 11/24/2019 PCP: [name], MD  Hospital Course/Subjective: Jason Riley is a 86 y.o. male with medical history significant of myasthenia gravis, severe aortic stenosis hypertension, hypothyroidism presented with increasing generalized weakness, trouble swallowing, weight loss. Seen by neurology who recommends NG until swallowing better, plasma exchange for 5 total doses with treatment every other day. Had first exchange 2/3. Will need HH PT for discharge.  Assessment/Plan: Active Problems:   Myasthenia gravis ([place])  Myasthenia gravis exacerbation Neurology following. Tunneled cath placed by IR 2/3. Continue plasma exchange for 5 total doses QOD, first dose on 11/25/19, dose #2 on 11/27/19, dose #3 today Check NIF Continue his home medications, holding ACE.  Dysphagia Likley related to the worsening MG from not coherent with his meds. But other etiology may need to be ruled out, such as severe aortic stenosis can also possibly cause dysphagia. Liquid diet plus Ensure supplements for now Neuro recommends consider NG tube for PO meds, SLP has seen and recommends a noted.  Generalized weakness Likely related to MG exacerbation, again other etiologies should be considered such as severe aortic stenosis, ordered a echocardiogram, has very severe aortic stenosis. PT/OT recommended HHC PT  Severe aortic stenosis, as above. Monitor patient volume status. Valvular cardiology to follow up  Severe protein calorie malnutrition From dysphagia, as above. Nutrition consult for supplement recommendation  HTN, continue home meds.  Hypothyroidism, on Synthroid   DVT prophylaxis: Heparin subcu Code Status: Full code Family Communication: No family present this AM  Disposition Plan: Probably home with services. Consults called: Neurology Dr. Cheral Marker Admission status: Telemetry admission   Subjective: no overnight  acute issues. Pending 3rd dose of plasmapharesis today. No SOB. He's feeling overall weak but compared with last two days, he felt a bit stronger. Worked with PT/OT yesterday who recommended [facility] PT for discharge.  Objective: Vitals:   11/28/19 1954 11/28/19 2357 11/29/19 0336 11/29/19 0500  BP: (!) 111/57 120/77 (!) 134/53   Pulse: 81 73 72   Resp: 17 17 17    Temp: 98.5 F (36.9 C) 98.7 F (37.1 C) 98.1 F (36.7 C)   TempSrc: Oral Oral Oral   SpO2: 99% 97% 97%   Weight:    56.7 kg  Height:        Intake/Output Summary (Last 24 hours) at 11/29/2019 0818 Last data filed at 11/29/2019 0600 Gross per 24 hour  Intake 957 ml  Output 900 ml  Net 57 ml   Filed Weights   11/25/19 1033 11/29/19 0500  Weight: 60.4 kg 56.7 kg     Exam: General:  Alert, oriented, calm, thin and malnourished, in no acute distress, hard of hearing Eyes: EOMI, clear sclerea Neck: supple, no masses, trachea mildline  Cardiovascular: RRR, 3/6 systolic murmur, no peripheral edema  Respiratory: clear to auscultation bilaterally, no wheezes, no crackles  Abdomen: soft, nontender, nondistended, normal bowel tones heard  Skin: dry, no rashes  Musculoskeletal: no joint effusions, normal range of motion  Psychiatric: appropriate affect, normal speech  Neurologic: extraocular muscles intact, clear speech, moving all extremities with intact sensorium but globally weak   Data Reviewed: CBC: Recent Labs  Lab 11/25/19 0226 11/26/19 0445 11/27/19 0358 11/28/19 0522 11/29/19 0452  WBC 6.3 6.6 6.2 5.8 5.5  NEUTROABS 4.3 4.5 4.1 3.5 3.2  HGB 14.6 15.4 15.0 15.1 14.3  HCT 43.8 46.6 45.0 46.0 43.0  MCV 93.0 93.4 91.5 93.9 92.5  PLT 94* 86* 82*  83* 80*   Basic Metabolic Panel: Recent Labs  Lab 11/25/19 1238 11/26/19 0445 11/27/19 0358 11/28/19 0522 11/29/19 0452  NA 139 141 140 139 137  K 4.0 4.3 3.9 4.2 4.2  CL 103 109 103 108 103  CO2 29 25 25 26 26   GLUCOSE 126* 103* 97 98 99  BUN 30* 27* 29* 26*  31*  CREATININE 1.88* 1.78* 1.64* 1.66* 1.50*  CALCIUM 9.8 9.6 9.8 9.5 9.3   GFR: Estimated Creatinine Clearance: 27.3 mL/min (A) (by C-G formula based on SCr of 1.5 mg/dL (H)). Liver Function Tests: Recent Labs  Lab 11/25/19 0226 11/26/19 0445 11/27/19 0358 11/28/19 0522 11/29/19 0452  AST 19 11* 20 17 31   ALT 15 8 10 11 20   ALKPHOS 55 23* 32* 19* 30*  BILITOT 1.0 1.4* 1.3* 1.4* 1.1  PROT 5.8* 4.7* 5.0* 4.5* 4.5*  ALBUMIN 3.0* 3.7 3.4* 3.9 3.5   No results for input(s): LIPASE, AMYLASE in the last 168 hours. No results for input(s): AMMONIA in the last 168 hours. Coagulation Profile: Recent Labs  Lab 11/25/19 0226 11/26/19 0445 11/27/19 0358 11/28/19 0522 11/29/19 0452  INR 1.1 1.5* 1.2 1.5* 1.1   Cardiac Enzymes: No results for input(s): CKTOTAL, CKMB, CKMBINDEX, TROPONINI in the last 168 hours. BNP (last 3 results) No results for input(s): PROBNP in the last 8760 hours. HbA1C: No results for input(s): HGBA1C in the last 72 hours. CBG: No results for input(s): GLUCAP in the last 168 hours. Lipid Profile: No results for input(s): CHOL, HDL, LDLCALC, TRIG, CHOLHDL, LDLDIRECT in the last 72 hours. Thyroid Function Tests: No results for input(s): TSH, T4TOTAL, FREET4, T3FREE, THYROIDAB in the last 72 hours. Anemia Panel: No results for input(s): VITAMINB12, FOLATE, FERRITIN, TIBC, IRON, RETICCTPCT in the last 72 hours. Urine analysis:    Component Value Date/Time   COLORURINE YELLOW 05/02/2018 1750   APPEARANCEUR HAZY (A) 05/02/2018 1750   LABSPEC 1.018 05/02/2018 1750   PHURINE 5.0 05/02/2018 1750   GLUCOSEU NEGATIVE 05/02/2018 1750   HGBUR NEGATIVE 05/02/2018 1750   BILIRUBINUR NEGATIVE 05/02/2018 1750   KETONESUR NEGATIVE 05/02/2018 1750   PROTEINUR NEGATIVE 05/02/2018 1750   NITRITE NEGATIVE 05/02/2018 1750   LEUKOCYTESUR NEGATIVE 05/02/2018 1750   Sepsis Labs: @LABRCNTIP (procalcitonin:4,lacticidven:4)  ) Recent Results (from the past 240 hour(s))    SARS CORONAVIRUS 2 (TAT 6-24 HRS) Nasopharyngeal Nasopharyngeal Swab     Status: None   Collection Time: 11/24/19  3:12 PM   Specimen: Nasopharyngeal Swab  Result Value Ref Range Status   SARS Coronavirus 2 NEGATIVE NEGATIVE Final    Comment: (NOTE) SARS-CoV-2 target nucleic acids are NOT DETECTED. The SARS-CoV-2 RNA is generally detectable in upper and lower respiratory specimens during the acute phase of infection. Negative results do not preclude SARS-CoV-2 infection, do not rule out co-infections with other pathogens, and should not be used as the sole basis for treatment or other patient management decisions. Negative results must be combined with clinical observations, patient history, and epidemiological information. The expected result is Negative. Fact Sheet for Patients: SugarRoll.be Fact Sheet for Healthcare Providers: https://www.woods-mathews.com/ This test is not yet approved or cleared by the [place] FDA and  has been authorized for detection and/or diagnosis of SARS-CoV-2 by FDA under an Emergency Use Authorization (EUA). This EUA will remain  in effect (meaning this test can be used) for the duration of the COVID-19 declaration under Section 56 4(b)(1) of the Act, 21 U.S.C. section 360bbb-3(b)(1), unless the authorization is terminated or revoked  sooner. Performed at [facility], [place] [address], [place], [place] [address]   SARS CORONAVIRUS 2 (TAT 6-24 HRS) Nasopharyngeal Nasopharyngeal Swab     Status: None   Collection Time: 11/27/19  9:25 AM   Specimen: Nasopharyngeal Swab  Result Value Ref Range Status   SARS Coronavirus 2 NEGATIVE NEGATIVE Final    Comment: (NOTE) SARS-CoV-2 target nucleic acids are NOT DETECTED. The SARS-CoV-2 RNA is generally detectable in upper and lower respiratory specimens during the acute phase of infection. Negative results do not preclude SARS-CoV-2 infection, do not rule  out co-infections with other pathogens, and should not be used as the sole basis for treatment or other patient management decisions. Negative results must be combined with clinical observations, patient history, and epidemiological information. The expected result is Negative. Fact Sheet for Patients: SugarRoll.be Fact Sheet for Healthcare Providers: https://www.woods-mathews.com/ This test is not yet approved or cleared by the [place] FDA and  has been authorized for detection and/or diagnosis of SARS-CoV-2 by FDA under an Emergency Use Authorization (EUA). This EUA will remain  in effect (meaning this test can be used) for the duration of the COVID-19 declaration under Section 56 4(b)(1) of the Act, 21 U.S.C. section 360bbb-3(b)(1), unless the authorization is terminated or revoked sooner. Performed at [facility], [place] [address] [place], [place] [address]      Studies: No results found.  Scheduled Meds: . aspirin EC  81 mg Oral Daily  . Chlorhexidine Gluconate Cloth  6 each Topical Daily  . feeding supplement (ENSURE ENLIVE)  237 mL Oral TID BM  . heparin  5,000 Units Subcutaneous Q12H  . hydrochlorothiazide  12.5 mg Oral Daily  . levothyroxine  50 mcg Oral Daily  . magnesium hydroxide  5 mL Oral QPC lunch  . multivitamin with minerals  1 tablet Oral Daily  . mycophenolate  250 mg Oral BID  . rosuvastatin  20 mg Oral Daily  . senna-docusate  1 tablet Oral BID    Continuous Infusions: . citrate dextrose       LOS: 5 days   Time spent: 25 minutes on this encounter with >50% of time on direct patient care and plan formulation.  Jason Stack, MD [facility] Pager [phone]  If 7PM-7AM, please contact night-coverage [email or web address] Password [facility] 11/29/2019, 8:18 AM

## 2019-11-29 NOTE — Progress Notes (Signed)
Received report that the pt was scheduled to undergo plasmapheresis during the day but had not done it, attempted severally to call hemodialysis with no success

## 2019-11-29 NOTE — Plan of Care (Signed)
  Problem: Education: Goal: Knowledge of General Education information will improve Description Including pain rating scale, medication(s)/side effects and non-pharmacologic comfort measures Outcome: Progressing   

## 2019-11-30 DIAGNOSIS — G7001 Myasthenia gravis with (acute) exacerbation: Principal | ICD-10-CM

## 2019-11-30 DIAGNOSIS — E43 Unspecified severe protein-calorie malnutrition: Secondary | ICD-10-CM | POA: Diagnosis present

## 2019-11-30 LAB — CBC WITH DIFFERENTIAL/PLATELET
Abs Immature Granulocytes: 0.02 10*3/uL (ref 0.00–0.07)
Basophils Absolute: 0 10*3/uL (ref 0.0–0.1)
Basophils Relative: 1 %
Eosinophils Absolute: 0.2 10*3/uL (ref 0.0–0.5)
Eosinophils Relative: 3 %
HCT: 43.3 % (ref 39.0–52.0)
Hemoglobin: 14.3 g/dL (ref 13.0–17.0)
Immature Granulocytes: 0 %
Lymphocytes Relative: 26 %
Lymphs Abs: 1.5 10*3/uL (ref 0.7–4.0)
MCH: 30.4 pg (ref 26.0–34.0)
MCHC: 33 g/dL (ref 30.0–36.0)
MCV: 91.9 fL (ref 80.0–100.0)
Monocytes Absolute: 0.6 10*3/uL (ref 0.1–1.0)
Monocytes Relative: 11 %
Neutro Abs: 3.6 10*3/uL (ref 1.7–7.7)
Neutrophils Relative %: 59 %
Platelets: 84 10*3/uL — ABNORMAL LOW (ref 150–400)
RBC: 4.71 MIL/uL (ref 4.22–5.81)
RDW: 13.2 % (ref 11.5–15.5)
WBC: 6 10*3/uL (ref 4.0–10.5)
nRBC: 0 % (ref 0.0–0.2)

## 2019-11-30 LAB — POCT I-STAT, CHEM 8
BUN: 32 mg/dL — ABNORMAL HIGH (ref 8–23)
Calcium, Ion: 1.35 mmol/L (ref 1.15–1.40)
Chloride: 98 mmol/L (ref 98–111)
Creatinine, Ser: 1.6 mg/dL — ABNORMAL HIGH (ref 0.61–1.24)
Glucose, Bld: 129 mg/dL — ABNORMAL HIGH (ref 70–99)
HCT: 44 % (ref 39.0–52.0)
Hemoglobin: 15 g/dL (ref 13.0–17.0)
Potassium: 3.9 mmol/L (ref 3.5–5.1)
Sodium: 137 mmol/L (ref 135–145)
TCO2: 27 mmol/L (ref 22–32)

## 2019-11-30 LAB — COMPREHENSIVE METABOLIC PANEL
ALT: 25 U/L (ref 0–44)
AST: 31 U/L (ref 15–41)
Albumin: 3.6 g/dL (ref 3.5–5.0)
Alkaline Phosphatase: 38 U/L (ref 38–126)
Anion gap: 10 (ref 5–15)
BUN: 35 mg/dL — ABNORMAL HIGH (ref 8–23)
CO2: 29 mmol/L (ref 22–32)
Calcium: 9.8 mg/dL (ref 8.9–10.3)
Chloride: 99 mmol/L (ref 98–111)
Creatinine, Ser: 1.66 mg/dL — ABNORMAL HIGH (ref 0.61–1.24)
GFR calc Af Amer: 42 mL/min — ABNORMAL LOW (ref >60)
GFR calc non Af Amer: 36 mL/min — ABNORMAL LOW (ref >60)
Glucose, Bld: 96 mg/dL (ref 70–99)
Potassium: 4.7 mmol/L (ref 3.5–5.1)
Sodium: 138 mmol/L (ref 135–145)
Total Bilirubin: 1.4 mg/dL — ABNORMAL HIGH (ref 0.3–1.2)
Total Protein: 5 g/dL — ABNORMAL LOW (ref 6.5–8.1)

## 2019-11-30 LAB — BASIC METABOLIC PANEL
Anion gap: 8 (ref 5–15)
BUN: 33 mg/dL — ABNORMAL HIGH (ref 8–23)
CO2: 24 mmol/L (ref 22–32)
Calcium: 9.3 mg/dL (ref 8.9–10.3)
Chloride: 104 mmol/L (ref 98–111)
Creatinine, Ser: 1.62 mg/dL — ABNORMAL HIGH (ref 0.61–1.24)
GFR calc Af Amer: 43 mL/min — ABNORMAL LOW (ref >60)
GFR calc non Af Amer: 37 mL/min — ABNORMAL LOW (ref >60)
Glucose, Bld: 109 mg/dL — ABNORMAL HIGH (ref 70–99)
Potassium: 4.5 mmol/L (ref 3.5–5.1)
Sodium: 136 mmol/L (ref 135–145)

## 2019-11-30 LAB — PROTIME-INR
INR: 1.1 (ref 0.8–1.2)
Prothrombin Time: 13.8 seconds (ref 11.4–15.2)

## 2019-11-30 MED ORDER — ACD FORMULA A 0.73-2.45-2.2 GM/100ML VI SOLN
Status: AC
  Filled 2019-11-30: qty 500

## 2019-11-30 MED ORDER — CALCIUM CARBONATE ANTACID 500 MG PO CHEW: 2.0000 | CHEWABLE_TABLET | ORAL | Status: AC

## 2019-11-30 MED ORDER — ACD FORMULA A 0.73-2.45-2.2 GM/100ML VI SOLN
500.0000 mL | Status: DC
  Filled 2019-11-30: qty 500

## 2019-11-30 MED ORDER — HEPARIN SODIUM (PORCINE) 1000 UNIT/ML IJ SOLN: 1000.0000 [IU] | Freq: Once | INTRAMUSCULAR | Status: DC

## 2019-11-30 MED ORDER — SODIUM CHLORIDE 0.9 % IV SOLN
INTRAVENOUS | Status: AC
  Filled 2019-11-30 (×3): qty 200

## 2019-11-30 MED ORDER — HEPARIN SODIUM (PORCINE) 1000 UNIT/ML IJ SOLN
INTRAMUSCULAR | Status: AC
  Filled 2019-11-30: qty 4

## 2019-11-30 MED ORDER — SODIUM CHLORIDE 0.9 % IV SOLN
2.0000 g | Freq: Once | INTRAVENOUS | Status: AC
  Administered 2019-11-30: 2 g via INTRAVENOUS
  Filled 2019-11-30: qty 20

## 2019-11-30 MED ORDER — DIPHENHYDRAMINE HCL 25 MG PO CAPS: 25.0000 mg | ORAL_CAPSULE | Freq: Four times a day (QID) | ORAL | Status: DC | PRN

## 2019-11-30 MED ORDER — ACETAMINOPHEN 325 MG PO TABS
650.0000 mg | ORAL_TABLET | ORAL | Status: DC | PRN
  Administered 2019-12-01: 650 mg via ORAL
  Filled 2019-11-30: qty 2

## 2019-11-30 MED ORDER — PYRIDOSTIGMINE BROMIDE 60 MG PO TABS
30.0000 mg | ORAL_TABLET | Freq: Two times a day (BID) | ORAL | Status: DC
  Administered 2019-11-30 – 2019-12-03 (×6): 30 mg via ORAL
  Filled 2019-11-30 (×7): qty 0.5

## 2019-11-30 NOTE — Progress Notes (Signed)
Occupational Therapy Treatment Patient Details Name: Jason Riley MRN: [id number] DOB: 01-24-1929 Today's Date: 11/30/2019    History of present illness Pt is an 86 y/o male admittes secondary to increased weakness, trouble swallowing and weight loss. Thought to be secondary to myasthenia gravis flare. PMH includes myasthenia gravis, aortic stenosis, HTN, and prostate cancer.    OT comments  Patient progressing well towards OT goals.  Completing transfers, toileting, grooming and in room mobility with supervision and no AD.  He demonstrates ability to retrieve items and manage needs for grooming at sink with supervision, given increased time. No LOB noted, but generalized weakness and decreased activity tolerance limit patient. DC plan remains appropriate. Will follow.    Follow Up Recommendations  Home health OT;Supervision - Intermittent    Equipment Recommendations  None recommended by OT    Recommendations for Other Services      Precautions / Restrictions Precautions Precautions: Fall Restrictions Weight Bearing Restrictions: No       Mobility Bed Mobility Overal bed mobility: Needs Assistance Bed Mobility: Supine to Sit;Sit to Supine     Supine to sit: Supervision Sit to supine: Supervision   General bed mobility comments: Supervision for safety   Transfers Overall transfer level: Needs assistance Equipment used: None Transfers: Sit to/from Stand Sit to Stand: Supervision         General transfer comment: supervision for safety, no physical assist or LOB noted     Balance Overall balance assessment: Needs assistance Sitting-balance support: No upper extremity supported;Feet supported Sitting balance-Leahy Scale: Good     Standing balance support: No upper extremity supported;During functional activity Standing balance-Leahy Scale: Fair                             ADL either performed or assessed with clinical judgement   ADL Overall ADL's :  Needs assistance/impaired     Grooming: Wash/dry hands;Oral care;Wash/dry face;Brushing hair;Supervision/safety;Standing               Lower Body Dressing: Supervision/safety;Sit to/from stand   Toilet Transfer: Supervision/safety;Ambulation   Toileting- Clothing Manipulation and Hygiene: Supervision/safety;Sit to/from stand       Functional mobility during ADLs: Supervision/safety General ADL Comments: patient progressing near baseline, completing mobility and ADls with supervision for safety, no AD; retrieved items from bag and brought to sink; good balance      Vision       Perception     Praxis      Cognition Arousal/Alertness: Awake/alert Behavior During Therapy: WFL for tasks assessed/performed Overall Cognitive Status: Within Functional Limits for tasks assessed                                          Exercises     Shoulder Instructions       General Comments      Pertinent Vitals/ Pain       Pain Assessment: No/denies pain  Home Living                                          Prior Functioning/Environment              Frequency  Min 2X/week        Progress Toward  Goals  OT Goals(current goals can now be found in the care plan section)  Progress towards OT goals: Progressing toward goals  Acute Rehab OT Goals Patient Stated Goal: to get home soon OT Goal Formulation: With patient  Plan Discharge plan remains appropriate;Frequency remains appropriate    Co-evaluation                 AM-PAC OT "6 Clicks" Daily Activity     Outcome Measure   Help from another person eating meals?: None Help from another person taking care of personal grooming?: None Help from another person toileting, which includes using toliet, bedpan, or urinal?: None Help from another person bathing (including washing, rinsing, drying)?: A Little Help from another person to put on and taking off regular upper body  clothing?: None Help from another person to put on and taking off regular lower body clothing?: A Little 6 Click Score: 22    End of Session Equipment Utilized During Treatment: Gait belt  OT Visit Diagnosis: Unsteadiness on feet (R26.81);Muscle weakness (generalized) (M62.81)   Activity Tolerance Patient tolerated treatment well   Patient Left in bed;with call bell/phone within reach;with bed alarm set   Nurse Communication Mobility status        Time: [id number] OT Time Calculation (min): 21 min  Charges: OT General Charges $OT Visit: 1 Visit OT Treatments $Self Care/Home Management : 8-22 mins  Jolaine Artist, OT [place] Pager [phone] Office [phone]     Delight Stare 11/30/2019, 12:48 PM

## 2019-11-30 NOTE — Progress Notes (Signed)
Alert and oriented 4x, plasmapheresis complete today. No problems with swallowing, bilateral strength improved. Standing and ambulating to bathroom with standby assist. Voiding well, tolerating ensure enlive products.

## 2019-11-30 NOTE — Progress Notes (Signed)
Physical Therapy Treatment Patient Details Name: Jason Riley MRN: [id number] DOB: March 27, 1929 Today's Date: 11/30/2019    History of Present Illness Pt is an 86 y/o male admittes secondary to increased weakness, trouble swallowing and weight loss. Thought to be secondary to myasthenia gravis flare. PMH includes myasthenia gravis, aortic stenosis, HTN, and prostate cancer.     PT Comments    Patient received in bed, agrees to PT session. Patient lethargic, slow to initiate mobility. Performed bed mobility with no physical assist. Transfers sit to stand with supervision only. Patient requests to use RW for ambulation, "just in case". Ambulated 175 feet with min guard. No lob or difficulties noted other than decreased pace. Patient assisted to bathroom and changed socks, gown as he began having bowel movement at end of ambulation. Patient will continue to benefit from skilled PT while here to improve strength and activity tolerance.     Follow Up Recommendations  Home health PT;Supervision for mobility/OOB     Equipment Recommendations       Recommendations for Other Services       Precautions / Restrictions Precautions Precautions: Fall Restrictions Weight Bearing Restrictions: No    Mobility  Bed Mobility Overal bed mobility: Modified Independent Bed Mobility: Supine to Sit;Sit to Supine     Supine to sit: Modified independent (Device/Increase time) Sit to supine: Modified independent (Device/Increase time)   General bed mobility comments: use of rails  Transfers Overall transfer level: Modified independent Equipment used: Rolling walker (2 wheeled) Transfers: Sit to/from Stand Sit to Stand: Modified independent (Device/Increase time)         General transfer comment: supervision for safety, no physical assist or LOB noted   Ambulation/Gait Ambulation/Gait assistance: Min guard Gait Distance (Feet): 200 Feet Assistive device: Rolling walker (2 wheeled) Gait  Pattern/deviations: Step-through pattern;Decreased stride length Gait velocity: Decreased   General Gait Details: Patient ambulating well with RW, good safety awareness.   Stairs             Wheelchair Mobility    Modified Rankin (Stroke Patients Only)       Balance Overall balance assessment: Needs assistance Sitting-balance support: Feet supported Sitting balance-Leahy Scale: Good     Standing balance support: During functional activity;Bilateral upper extremity supported Standing balance-Leahy Scale: Good Standing balance comment: Good balance with B UE support                            Cognition Arousal/Alertness: Awake/alert Behavior During Therapy: WFL for tasks assessed/performed Overall Cognitive Status: Within Functional Limits for tasks assessed                                        Exercises      General Comments        Pertinent Vitals/Pain Pain Assessment: No/denies pain    Home Living                      Prior Function            PT Goals (current goals can now be found in the care plan section) Acute Rehab PT Goals Patient Stated Goal: to get home soon PT Goal Formulation: With patient Time For Goal Achievement: 12/08/19 Potential to Achieve Goals: Good Progress towards PT goals: Progressing toward goals    Frequency    Min  3X/week      PT Plan Current plan remains appropriate    Co-evaluation              AM-PAC PT "6 Clicks" Mobility   Outcome Measure  Help needed turning from your back to your side while in a flat bed without using bedrails?: None Help needed moving from lying on your back to sitting on the side of a flat bed without using bedrails?: None Help needed moving to and from a bed to a chair (including a wheelchair)?: A Little Help needed standing up from a chair using your arms (e.g., wheelchair or bedside chair)?: None Help needed to walk in hospital room?: A  Little Help needed climbing 3-5 steps with a railing? : A Little 6 Click Score: 21    End of Session Equipment Utilized During Treatment: Gait belt Activity Tolerance: Patient tolerated treatment well Patient left: in bed;with call bell/phone within reach;with bed alarm set;with family/visitor present Nurse Communication: Mobility status PT Visit Diagnosis: Other abnormalities of gait and mobility (R26.89);Muscle weakness (generalized) (M62.81)     Time: [id number] PT Time Calculation (min) (ACUTE ONLY): 33 min  Charges:  $Gait Training: 23-37 mins                     Rachel Samples, PT, GCS 11/30/19,12:55 PM

## 2019-11-30 NOTE — Progress Notes (Addendum)
  Speech Language Pathology Treatment: Dysphagia  Patient Details Name: Jason Riley MRN: [id number] DOB: 1929-01-17 Today's Date: 11/30/2019 Time: 3893-7342 SLP Time Calculation (min) (ACUTE ONLY): 14 min  Assessment / Plan / Recommendation Clinical Impression  Patient seen at bedside for skilled ST targeting dysphagia. Patient in bed, HOB raised so he was sitting upright. Patient reports he has not had any adverse swallowing events since he last saw this clinician (on 2/5). He reports he is tolerating dysphagia 3 solids well, when he encounters a food item he feels he will be unable to chew (giving the example of broccoli), he avoids it. Patient seen with thin liquids (water) via self administered straw and cup sips. No overt s/sx aspiration despite thorough challenging.  Patient agreeable to chopped peaches. Good oral acceptance, oral phase remarkable for prolonged mastication (patient is edentulous). Swallow initiation appeared timely with no overt s/sx aspiration observed during or following the swallow. ST offered trials of regular solids, patient refusing at this time. Patient agreeable to continuation of dysphagia 3 solids/thin liquids. ST provided education re: strategies of taking small bites/sips, sitting upright for PO intake, and slowing rate of intake. Patient has met swallowing goals. No further ST indicated at the acute level. Recommend continuing dysphagia 3 solids/ thin liquids. Please re-consult ST should needs arise at the acute level.    HPI HPI: Pt is an 86 y.o. male with medical history significant of myasthenia gravis, severe aortic stenosis hypertension, hypothyroidism who presented with increasing generalized weakness, trouble swallowing, weight loss. Chest x-ray of 11/24/19 was stable without evidence of acute or active cardiopulmonary disease.      SLP Plan  Dysphagia goals met, patient educated, discharged        Recommendations  Diet recommendations: Dysphagia 3  (mechanical soft);Thin liquid Liquids provided via: Cup;Straw Medication Administration: Whole meds with puree Supervision: Patient able to self feed;Intermittent supervision to cue for compensatory strategies Compensations: Small sips/bites;Slow rate;Multiple dry swallows after each bite/sip;Minimize environmental distractions Postural Changes and/or Swallow Maneuvers: Seated upright 90 degrees                Oral Care Recommendations: Oral care BID Follow up Recommendations: Home health SLP SLP Visit Diagnosis: Dysphagia, oropharyngeal phase (R13.12) Plan: Continue with current plan of care       Raw[place]ins, M.Ed., [place] [facility] Acute Rehabilitation [phone]: Acute Rehab office [phone] - pager    Jason Riley 11/30/2019, 4:29 PM

## 2019-11-30 NOTE — Progress Notes (Addendum)
NEUROLOGY PROGRESS NOTE   Subjective: Patient states that he ate more food today than usual.  He also states that his swallowing of solid foods have improved.  Exam: Vitals:   11/29/19 2327 11/30/19 0337  BP: 104/66 105/61  Pulse: 72 66  Resp: 18 16  Temp: 98.7 F (37.1 C) 97.9 F (36.6 C)  SpO2: 98% 98%    ROS General ROS: negative for - chills, fatigue, fever, night sweats, weight gain or weight loss Psychological ROS: negative for - behavioral disorder, hallucinations, memory difficulties, mood swings or suicidal ideation Ophthalmic ROS: Positive for - double vision-old ENT ROS: Positive for -presbycusis Respiratory ROS: negative for - cough, hemoptysis, shortness of breath or wheezing Cardiovascular ROS: negative for - chest pain, dyspnea on exertion, edema or irregular heartbeat Gastrointestinal ROS: negative for - abdominal pain, diarrhea, hematemesis, nausea/vomiting or stool incontinence Genito-Urinary ROS: negative for - dysuria, hematuria, incontinence or urinary frequency/urgency Musculoskeletal ROS: negative for - joint swelling or muscular weakness Neurological ROS: as noted in HPI Dermatological ROS: negative for rash and skin lesion changes    Physical Exam  Constitutional: Appears well-developed and well-nourished.  Eyes: No scleral injection HENT: No OP obstrucion Head: Normocephalic.  Cardiovascular: Normal rate and regular rhythm.  Respiratory: Effort normal, non-labored breathing GI: Soft.  No distension. There is no tenderness.  Skin: WDI   Neuro:  Mental Status: Patient is alert, awake oriented to person, hospital, month.  Speech is intact for naming, repeating and comprehension.  No aphasia or dysarthria Cranial Nerves: II:  Visual fields grossly normal,  III,IV, VI: Holding 1 eyes shut secondary to diplopia which is old, extra-ocular motions intact bilaterally pupils equal, round, reactive to light and accommodation V,VII: smile symmetric,  facial light touch sensation normal bilaterally VIII: hearing normal bilaterally IX,X: Palate rises midline XI: bilateral shoulder shrug XII: midline tongue extension Motor: Right : Upper extremity   5/5    Left:     Upper extremity   5/5  Lower extremity   5/5     Lower extremity   5/5 Tone and bulk:normal tone throughout; no atrophy noted Sensory: Pinprick and light touch intact throughout, bilaterally Deep Tendon Reflexes: 2+ and symmetric throughout Plantars: Right: downgoing   Left: downgoing Cerebellar: normal finger-to-nose,     Medications:  Scheduled: . aspirin EC  81 mg Oral Daily  . Chlorhexidine Gluconate Cloth  6 each Topical Daily  . feeding supplement (ENSURE ENLIVE)  237 mL Oral TID BM  . heparin  5,000 Units Subcutaneous Q12H  . hydrochlorothiazide  12.5 mg Oral Daily  . levothyroxine  50 mcg Oral Daily  . magnesium hydroxide  5 mL Oral QPC lunch  . multivitamin with minerals  1 tablet Oral Daily  . mycophenolate  250 mg Oral BID  . rosuvastatin  20 mg Oral Daily  . senna-docusate  1 tablet Oral BID   Pertinent Labs/Diagnostics: BUN 35 Creatinine 1.66 PT 13.8 INR 1.1 Vital capacity-patient able to count to 24 and 1 breath  Etta Quill PA-C [facility] [phone]  Assessment:  86 year old male with known myasthenia gravis who arrived to [facility] after having difficulty swallowing and generalized weakness due to missing his myasthenia medications for the last 4 to 5 months.  Overall presentation most consistent with myasthenia gravis exacerbation.  Patient is now received 2 sessions of plasma exchange with third session to be given today.  Today's exam shows improvement especially with patient stating that he is able to swallow and eat  more.   Impression: -Myasthenia exacerbation in the setting of medication noncompliance.  Recommendations: -Respiratory therapy for vital capacities and NIF twice daily -Continue with speech  therapy -Continue with plasma exchange today with day 3 -Continue CellCept -At this point may consider starting Mestinon at 30 mg 2 times daily and close attention to patient secretions and/or diarrhea/abdominal pain, can gradually increase.  11/30/2019, 8:39 AM   NEUROHOSPITALIST ADDENDUM Performed a face to face diagnostic evaluation.   I have reviewed the contents of history and physical exam as documented by PA/ARNP/Resident and agree with above documentation.  I have discussed and formulated the above plan as documented. Edits to the note have been made as needed.  Patient looks clinically well, states he had had significant improvement in speech and states that he is able to swallow much better.  He has 2 plasmapheresis treatments remaining.   Karena Addison Chancelor Hardrick MD [facility] [phone]   If 7pm to 7am, please call on call as listed on AMION.

## 2019-11-30 NOTE — Progress Notes (Addendum)
PROGRESS NOTE    ENGELBERT BENCOSME  [id number] DOB: December 24, 1928 DOA: 11/24/2019 PCP: [name], MD    Brief Narrative:  86 year old male who presented generalized weakness.  He does have significant past medical history for myasthenia gravis, severe aortic stenosis, hypertension and hypothyroidism.  Patient reported generalized weakness and difficulty swallowing.  Apparently patient was unable to take his oral medications for the last 4 to 5 months.  For the last 3 months patient had generalized weakness, difficulty swallowing, initially solid food and thin liquids.  30 pound weight loss over the last 3 months.  10 days ago he refilled his medications but his symptoms were persistent.  On his initial physical examination blood pressure 131/52, heart rate 70, respiratory rate 16, temperature 98.1, oxygen saturation 90%.  His lungs were clear to auscultation bilaterally, heart S1-S2 present rhythmic, soft abdomen, no lower extremity edema, strength was diminished 4 out of 5 all 4 extremities.  Patient was admitted to the hospital working diagnosis of myasthenia gravis exacerbation.  Patient was seen by neurology, with recommendations for plasma exchange, HD catheter was placed by IR on February 3.  Patient underwent plasma exchange with no major complications.   Assessment & Plan:   Principal Problem:   Myasthenia gravis with acute exacerbation (HCC) Active Problems:   CKD (chronic kidney disease), stage III (HCC)   Coronary artery disease involving native heart   Benign essential HTN   Hypothyroid   Esophageal dysphagia   Severe aortic stenosis   Weakness generalized   Presbycusis of both ears   Protein-calorie malnutrition, severe   1. Acute myasthenia gravis exacerbation. Patient slowly improving, tolerating well plasma pheresis. Last dose will be Friday. Continue with close neuro checks, physical and occupational therapy. Continue aspiration precautions for dysphagia.   2.  Severe protein calorie protein malnutrition. Continue nutritional supplements.   3. HTN. Continue blood pressure control with HCTZ.   4. Hypothyroid. Continue with levothyroxine.   5. CKD stage 3b. Stable renal function with serum cr at 1,60 with K at 3,9 and serum bicarbonate at 32. Follow renal panel in am.    DVT prophylaxis: enoxaparin   Code Status:  full Family Communication: no family at the bedside Disposition Plan/ discharge barriers: pending completion of plasma exchange this Friday.    Nutrition Status: Nutrition Problem: Severe Malnutrition Etiology: chronic illness(myasthenia gravis and dysphagia) Signs/Symptoms: percent weight loss, energy intake < or equal to 75% for > or equal to 1 month, moderate fat depletion, severe muscle depletion, moderate muscle depletion, severe fat depletion Percent weight loss: 11.6 % Interventions: Magic cup, MVI, Ensure Enlive (each supplement provides 350kcal and 20 grams of protein)    Consultants:   Neurology   IR   Procedures:   HD catheter for plasmapheresis 02//03       Subjective: Patient seen at HD unit, feeling better, but not yet back to baseline, no nausea or vomiting, no chest pain or dyspnea.   Objective: Vitals:   11/29/19 2327 11/30/19 0337 11/30/19 0415 11/30/19 0839  BP: 104/66 105/61  118/71  Pulse: 72 66  66  Resp: 18 16  (!) 24  Temp: 98.7 F (37.1 C) 97.9 F (36.6 C)  (!) 97.5 F (36.4 C)  TempSrc: Oral Oral  Oral  SpO2: 98% 98%  97%  Weight:   60.3 kg   Height:        Intake/Output Summary (Last 24 hours) at 11/30/2019 1216 Last data filed at 11/30/2019 0800 Gross per 24  hour  Intake 120 ml  Output 450 ml  Net -330 ml   Filed Weights   11/25/19 1033 11/29/19 0500 11/30/19 0415  Weight: 60.4 kg 56.7 kg 60.3 kg    Examination:   General: Not in pain or dyspnea, deconditioned  Neurology: Awake and alert, non focal  E ENT: mild pallor, no icterus, oral mucosa moist Cardiovascular: No  JVD. S1-S2 present, rhythmic, no gallops, rubs, or murmurs. No lower extremity edema. Pulmonary: positive breath sounds bilaterally, adequate air movement, no wheezing, rhonchi or rales. Gastrointestinal. Abdomen  with no organomegaly, non tender, no rebound or guarding Skin. No rashes Musculoskeletal: no joint deformities     Data Reviewed: I have personally reviewed following labs and imaging studies  CBC: Recent Labs  Lab 11/26/19 0445 11/27/19 0358 11/28/19 0522 11/29/19 0452 11/30/19 0131  WBC 6.6 6.2 5.8 5.5 6.0  NEUTROABS 4.5 4.1 3.5 3.2 3.6  HGB 15.4 15.0 15.1 14.3 14.3  HCT 46.6 45.0 46.0 43.0 43.3  MCV 93.4 91.5 93.9 92.5 91.9  PLT 86* 82* 83* 80* 84*   Basic Metabolic Panel: Recent Labs  Lab 11/26/19 0445 11/27/19 0358 11/28/19 0522 11/29/19 0452 11/30/19 0131  NA 141 140 139 137 138  K 4.3 3.9 4.2 4.2 4.7  CL 109 103 108 103 99  CO2 25 25 26 26 29   GLUCOSE 103* 97 98 99 96  BUN 27* 29* 26* 31* 35*  CREATININE 1.78* 1.64* 1.66* 1.50* 1.66*  CALCIUM 9.6 9.8 9.5 9.3 9.8   GFR: Estimated Creatinine Clearance: 26.2 mL/min (A) (by C-G formula based on SCr of 1.66 mg/dL (H)). Liver Function Tests: Recent Labs  Lab 11/26/19 0445 11/27/19 0358 11/28/19 0522 11/29/19 0452 11/30/19 0131  AST 11* 20 17 31 31   ALT 8 10 11 20 25   ALKPHOS 23* 32* 19* 30* 38  BILITOT 1.4* 1.3* 1.4* 1.1 1.4*  PROT 4.7* 5.0* 4.5* 4.5* 5.0*  ALBUMIN 3.7 3.4* 3.9 3.5 3.6   No results for input(s): LIPASE, AMYLASE in the last 168 hours. No results for input(s): AMMONIA in the last 168 hours. Coagulation Profile: Recent Labs  Lab 11/26/19 0445 11/27/19 0358 11/28/19 0522 11/29/19 0452 11/30/19 0131  INR 1.5* 1.2 1.5* 1.1 1.1   Cardiac Enzymes: No results for input(s): CKTOTAL, CKMB, CKMBINDEX, TROPONINI in the last 168 hours. BNP (last 3 results) No results for input(s): PROBNP in the last 8760 hours. HbA1C: No results for input(s): HGBA1C in the last 72  hours. CBG: No results for input(s): GLUCAP in the last 168 hours. Lipid Profile: No results for input(s): CHOL, HDL, LDLCALC, TRIG, CHOLHDL, LDLDIRECT in the last 72 hours. Thyroid Function Tests: No results for input(s): TSH, T4TOTAL, FREET4, T3FREE, THYROIDAB in the last 72 hours. Anemia Panel: No results for input(s): VITAMINB12, FOLATE, FERRITIN, TIBC, IRON, RETICCTPCT in the last 72 hours.    Radiology Studies: I have reviewed all of the imaging during this hospital visit personally     Scheduled Meds: . aspirin EC  81 mg Oral Daily  . calcium carbonate  2 tablet Oral Q3H  . Chlorhexidine Gluconate Cloth  6 each Topical Daily  . feeding supplement (ENSURE ENLIVE)  237 mL Oral TID BM  . heparin  1,000 Units Intracatheter Once  . heparin  5,000 Units Subcutaneous Q12H  . hydrochlorothiazide  12.5 mg Oral Daily  . levothyroxine  50 mcg Oral Daily  . magnesium hydroxide  5 mL Oral QPC lunch  . multivitamin with minerals  1 tablet  Oral Daily  . mycophenolate  250 mg Oral BID  . rosuvastatin  20 mg Oral Daily  . senna-docusate  1 tablet Oral BID   Continuous Infusions: . therapeutic plasma exchange solution    . calcium gluconate IVPB    . citrate dextrose    . citrate dextrose       LOS: 6 days        Kynsie Falkner Gerome Apley, MD

## 2019-11-30 NOTE — Progress Notes (Signed)
Pt is ordered beside pulmonary mechanics. NIF and VC. He was asleep when I entered his room and did not respond to my calls. Will attempt again when patient is awake.

## 2019-12-01 LAB — CBC WITH DIFFERENTIAL/PLATELET
Abs Immature Granulocytes: 0.01 10*3/uL (ref 0.00–0.07)
Basophils Absolute: 0.1 10*3/uL (ref 0.0–0.1)
Basophils Relative: 1 %
Eosinophils Absolute: 0.4 10*3/uL (ref 0.0–0.5)
Eosinophils Relative: 5 %
HCT: 42.8 % (ref 39.0–52.0)
Hemoglobin: 14.2 g/dL (ref 13.0–17.0)
Immature Granulocytes: 0 %
Lymphocytes Relative: 22 %
Lymphs Abs: 1.5 10*3/uL (ref 0.7–4.0)
MCH: 30.8 pg (ref 26.0–34.0)
MCHC: 33.2 g/dL (ref 30.0–36.0)
MCV: 92.8 fL (ref 80.0–100.0)
Monocytes Absolute: 0.6 10*3/uL (ref 0.1–1.0)
Monocytes Relative: 8 %
Neutro Abs: 4.4 10*3/uL (ref 1.7–7.7)
Neutrophils Relative %: 64 %
Platelets: 85 10*3/uL — ABNORMAL LOW (ref 150–400)
RBC: 4.61 MIL/uL (ref 4.22–5.81)
RDW: 13.5 % (ref 11.5–15.5)
WBC: 6.8 10*3/uL (ref 4.0–10.5)
nRBC: 0 % (ref 0.0–0.2)

## 2019-12-01 LAB — PROTIME-INR
INR: 1.3 — ABNORMAL HIGH (ref 0.8–1.2)
Prothrombin Time: 16.2 seconds — ABNORMAL HIGH (ref 11.4–15.2)

## 2019-12-01 LAB — COMPREHENSIVE METABOLIC PANEL
ALT: 21 U/L (ref 0–44)
AST: 30 U/L (ref 15–41)
Albumin: 3.7 g/dL (ref 3.5–5.0)
Alkaline Phosphatase: 18 U/L — ABNORMAL LOW (ref 38–126)
Anion gap: 8 (ref 5–15)
BUN: 28 mg/dL — ABNORMAL HIGH (ref 8–23)
CO2: 21 mmol/L — ABNORMAL LOW (ref 22–32)
Calcium: 9.3 mg/dL (ref 8.9–10.3)
Chloride: 108 mmol/L (ref 98–111)
Creatinine, Ser: 1.44 mg/dL — ABNORMAL HIGH (ref 0.61–1.24)
GFR calc Af Amer: 50 mL/min — ABNORMAL LOW (ref >60)
GFR calc non Af Amer: 43 mL/min — ABNORMAL LOW (ref >60)
Glucose, Bld: 102 mg/dL — ABNORMAL HIGH (ref 70–99)
Potassium: 4.7 mmol/L (ref 3.5–5.1)
Sodium: 137 mmol/L (ref 135–145)
Total Bilirubin: 1.4 mg/dL — ABNORMAL HIGH (ref 0.3–1.2)
Total Protein: 4.5 g/dL — ABNORMAL LOW (ref 6.5–8.1)

## 2019-12-01 NOTE — Plan of Care (Signed)
  Problem: Education: Goal: Knowledge of General Education information will improve Description Including pain rating scale, medication(s)/side effects and non-pharmacologic comfort measures Outcome: Progressing   

## 2019-12-01 NOTE — Progress Notes (Signed)
PROGRESS NOTE    Jason Riley  [id number] DOB: 06-01-1929 DOA: 11/24/2019 PCP: [name], MD    Brief Narrative:   Patient was admitted to the hospital with the working diagnosis of myasthenia gravis exacerbation.  86 year old male who presented generalized weakness.  He does have significant past medical history for myasthenia gravis, severe aortic stenosis, hypertension and hypothyroidism.  Patient reported generalized weakness and difficulty swallowing.  Apparently patient was unable to take his oral medications for the last 4 to 5 months.  For the last 3 months patient had generalized weakness, difficulty swallowing, initially solid food and thin liquids.  30 pound weight loss over the last 3 months.  10 days ago he refilled his medications but his symptoms were persistent.  On his initial physical examination blood pressure 131/52, heart rate 70, respiratory rate 16, temperature 98.1, oxygen saturation 90%.  His lungs were clear to auscultation bilaterally, heart S1-S2 present rhythmic, soft abdomen, no lower extremity edema, strength was diminished 4 out of 5 all 4 extremities.  Patient was seen by neurology, with recommendations for plasma exchange, HD catheter was placed by IR on February 3.  Patient underwent plasma exchange with no major complications   Assessment & Plan:   Principal Problem:   Myasthenia gravis with acute exacerbation (HCC) Active Problems:   CKD (chronic kidney disease), stage III (HCC)   Coronary artery disease involving native heart   Benign essential HTN   Hypothyroid   Esophageal dysphagia   Severe aortic stenosis   Weakness generalized   Presbycusis of both ears   Protein-calorie malnutrition, severe   1. Acute myasthenia gravis exacerbation. Patient continue slowly improving. NIF - 40 and VC 3,0 L  Continue medical therapy with plasma pheresis, scheduled last session this Friday. Continue with dysphagia 3 diet per speech recommendations  with aspiration precautions.  Neuro checks per unit protocol. Will need home health with home PT and OT.   Continue with mycophenolate and pyridostigmine.   2. Severe protein calorie protein malnutrition. Tolerating well nutritional supplements.   3. HTN. Continue with HCTZ for blood pressure control. Holding lisinopril for now.   4. Hypothyroid. On levothyroxine.   5. CKD stage 3b. Serum cr trending down to 1,44 with K at 4,7 and serum bicarbonate at 21. Will continue close follow up of renal function and electrolytes.   6. Dyslipidemia. Continue with rosuvastatin.   DVT prophylaxis: enoxaparin   Code Status:  full Family Communication: I spoke over the phone with the patient's nice about patient's  condition, plan of care, prognosis and all questions were addressed.  Disposition Plan/ discharge barriers: pending completion of plasma exchange this Friday.     Nutrition Status: Nutrition Problem: Severe Malnutrition Etiology: chronic illness(myasthenia gravis and dysphagia) Signs/Symptoms: percent weight loss, energy intake < or equal to 75% for > or equal to 1 month, moderate fat depletion, severe muscle depletion, moderate muscle depletion, severe fat depletion Percent weight loss: 11.6 % Interventions: Magic cup, MVI, Ensure Enlive (each supplement provides 350kcal and 20 grams of protein)    Consultants:   Neurology   IR   Procedures:   Right IJ catheter   Antimicrobials:       Subjective: Patient is feeling better, slowly recovering his strength, but not yet back to baseline. Tolerating po well, no nausea or vomiting, no dyspnea or chest pain.   Objective: Vitals:   11/30/19 2319 12/01/19 0333 12/01/19 0500 12/01/19 0833  BP: (!) 102/47 (!) 111/45  112/62  Pulse: 72 61  68  Resp: 20 20  20   Temp: 99.7 F (37.6 C) 97.7 F (36.5 C)  97.8 F (36.6 C)  TempSrc: Oral Oral  Oral  SpO2: 98% 98%  98%  Weight:   59.6 kg   Height:        Intake/Output  Summary (Last 24 hours) at 12/01/2019 1235 Last data filed at 12/01/2019 1039 Gross per 24 hour  Intake 360 ml  Output 450 ml  Net -90 ml   Filed Weights   11/29/19 0500 11/30/19 0415 12/01/19 0500  Weight: 56.7 kg 60.3 kg 59.6 kg    Examination:   General: Not in pain or dyspnea, deconditioned  Neurology: Awake and alert, non focal  E ENT: mild pallor, no icterus, oral mucosa moist Cardiovascular: No JVD. S1-S2 present, rhythmic, no gallops, rubs, or murmurs. No lower extremity edema. Pulmonary: positive breath sounds bilaterally, adequate air movement, no wheezing, rhonchi or rales. Gastrointestinal. Abdomen with no organomegaly, non tender, no rebound or guarding Skin. No rashes Musculoskeletal: no joint deformities     Data Reviewed: I have personally reviewed following labs and imaging studies  CBC: Recent Labs  Lab 11/27/19 0358 11/27/19 0358 11/28/19 0522 11/29/19 0452 11/30/19 0131 11/30/19 1243 12/01/19 0150  WBC 6.2  --  5.8 5.5 6.0  --  6.8  NEUTROABS 4.1  --  3.5 3.2 3.6  --  4.4  HGB 15.0   < > 15.1 14.3 14.3 15.0 14.2  HCT 45.0   < > 46.0 43.0 43.3 44.0 42.8  MCV 91.5  --  93.9 92.5 91.9  --  92.8  PLT 82*  --  83* 80* 84*  --  85*   < > = values in this interval not displayed.   Basic Metabolic Panel: Recent Labs  Lab 11/28/19 0522 11/28/19 0522 11/29/19 0452 11/30/19 0131 11/30/19 1058 11/30/19 1243 12/01/19 0150  NA 139   < > 137 138 136 137 137  K 4.2   < > 4.2 4.7 4.5 3.9 4.7  CL 108   < > 103 99 104 98 108  CO2 26  --  26 29 24   --  21*  GLUCOSE 98   < > 99 96 109* 129* 102*  BUN 26*   < > 31* 35* 33* 32* 28*  CREATININE 1.66*   < > 1.50* 1.66* 1.62* 1.60* 1.44*  CALCIUM 9.5  --  9.3 9.8 9.3  --  9.3   < > = values in this interval not displayed.   GFR: Estimated Creatinine Clearance: 29.9 mL/min (A) (by C-G formula based on SCr of 1.44 mg/dL (H)). Liver Function Tests: Recent Labs  Lab 11/27/19 0358 11/28/19 0522 11/29/19 0452  11/30/19 0131 12/01/19 0150  AST 20 17 31 31 30   ALT 10 11 20 25 21   ALKPHOS 32* 19* 30* 38 18*  BILITOT 1.3* 1.4* 1.1 1.4* 1.4*  PROT 5.0* 4.5* 4.5* 5.0* 4.5*  ALBUMIN 3.4* 3.9 3.5 3.6 3.7   No results for input(s): LIPASE, AMYLASE in the last 168 hours. No results for input(s): AMMONIA in the last 168 hours. Coagulation Profile: Recent Labs  Lab 11/27/19 0358 11/28/19 0522 11/29/19 0452 11/30/19 0131 12/01/19 0150  INR 1.2 1.5* 1.1 1.1 1.3*   Cardiac Enzymes: No results for input(s): CKTOTAL, CKMB, CKMBINDEX, TROPONINI in the last 168 hours. BNP (last 3 results) No results for input(s): PROBNP in the last 8760 hours. HbA1C: No results for input(s): HGBA1C in the last  72 hours. CBG: No results for input(s): GLUCAP in the last 168 hours. Lipid Profile: No results for input(s): CHOL, HDL, LDLCALC, TRIG, CHOLHDL, LDLDIRECT in the last 72 hours. Thyroid Function Tests: No results for input(s): TSH, T4TOTAL, FREET4, T3FREE, THYROIDAB in the last 72 hours. Anemia Panel: No results for input(s): VITAMINB12, FOLATE, FERRITIN, TIBC, IRON, RETICCTPCT in the last 72 hours.    Radiology Studies: I have reviewed all of the imaging during this hospital visit personally     Scheduled Meds: . aspirin EC  81 mg Oral Daily  . Chlorhexidine Gluconate Cloth  6 each Topical Daily  . feeding supplement (ENSURE ENLIVE)  237 mL Oral TID BM  . heparin  1,000 Units Intracatheter Once  . heparin  5,000 Units Subcutaneous Q12H  . hydrochlorothiazide  12.5 mg Oral Daily  . levothyroxine  50 mcg Oral Daily  . magnesium hydroxide  5 mL Oral QPC lunch  . multivitamin with minerals  1 tablet Oral Daily  . mycophenolate  250 mg Oral BID  . pyridostigmine  30 mg Oral BID  . rosuvastatin  20 mg Oral Daily  . senna-docusate  1 tablet Oral BID   Continuous Infusions: . citrate dextrose       LOS: 7 days        Aasim Restivo Gerome Apley, MD

## 2019-12-01 NOTE — Progress Notes (Signed)
Patient is asleep and unable to do the bedside mechanics

## 2019-12-01 NOTE — Progress Notes (Signed)
Performed NIF -40 and VC 3.0L. Great patient effort. Will continue to monitor.

## 2019-12-02 LAB — COMPREHENSIVE METABOLIC PANEL
ALT: 24 U/L (ref 0–44)
AST: 29 U/L (ref 15–41)
Albumin: 3.5 g/dL (ref 3.5–5.0)
Alkaline Phosphatase: 27 U/L — ABNORMAL LOW (ref 38–126)
Anion gap: 5 (ref 5–15)
BUN: 37 mg/dL — ABNORMAL HIGH (ref 8–23)
CO2: 26 mmol/L (ref 22–32)
Calcium: 9.3 mg/dL (ref 8.9–10.3)
Chloride: 104 mmol/L (ref 98–111)
Creatinine, Ser: 1.73 mg/dL — ABNORMAL HIGH (ref 0.61–1.24)
GFR calc Af Amer: 40 mL/min — ABNORMAL LOW (ref >60)
GFR calc non Af Amer: 34 mL/min — ABNORMAL LOW (ref >60)
Glucose, Bld: 105 mg/dL — ABNORMAL HIGH (ref 70–99)
Potassium: 4.8 mmol/L (ref 3.5–5.1)
Sodium: 135 mmol/L (ref 135–145)
Total Bilirubin: 1 mg/dL (ref 0.3–1.2)
Total Protein: 4.7 g/dL — ABNORMAL LOW (ref 6.5–8.1)

## 2019-12-02 LAB — CBC WITH DIFFERENTIAL/PLATELET
Abs Immature Granulocytes: 0.04 10*3/uL (ref 0.00–0.07)
Basophils Absolute: 0.1 10*3/uL (ref 0.0–0.1)
Basophils Relative: 1 %
Eosinophils Absolute: 0.5 10*3/uL (ref 0.0–0.5)
Eosinophils Relative: 7 %
HCT: 37.4 % — ABNORMAL LOW (ref 39.0–52.0)
Hemoglobin: 12.6 g/dL — ABNORMAL LOW (ref 13.0–17.0)
Immature Granulocytes: 1 %
Lymphocytes Relative: 23 %
Lymphs Abs: 1.7 10*3/uL (ref 0.7–4.0)
MCH: 31.1 pg (ref 26.0–34.0)
MCHC: 33.7 g/dL (ref 30.0–36.0)
MCV: 92.3 fL (ref 80.0–100.0)
Monocytes Absolute: 0.7 10*3/uL (ref 0.1–1.0)
Monocytes Relative: 9 %
Neutro Abs: 4.2 10*3/uL (ref 1.7–7.7)
Neutrophils Relative %: 59 %
Platelets: 84 10*3/uL — ABNORMAL LOW (ref 150–400)
RBC: 4.05 MIL/uL — ABNORMAL LOW (ref 4.22–5.81)
RDW: 13.5 % (ref 11.5–15.5)
WBC: 7.1 10*3/uL (ref 4.0–10.5)
nRBC: 0 % (ref 0.0–0.2)

## 2019-12-02 LAB — PROTIME-INR
INR: 1.1 (ref 0.8–1.2)
Prothrombin Time: 14 seconds (ref 11.4–15.2)

## 2019-12-02 MED ORDER — CALCIUM CARBONATE ANTACID 500 MG PO CHEW
CHEWABLE_TABLET | ORAL | Status: AC
  Administered 2019-12-02: 400 mg via ORAL
  Filled 2019-12-02: qty 4

## 2019-12-02 MED ORDER — SODIUM CHLORIDE 0.9 % IV SOLN: INTRAVENOUS | Status: DC

## 2019-12-02 MED ORDER — ACD FORMULA A 0.73-2.45-2.2 GM/100ML VI SOLN: 500.0000 mL | Status: DC

## 2019-12-02 MED ORDER — ACD FORMULA A 0.73-2.45-2.2 GM/100ML VI SOLN
Status: AC
  Administered 2019-12-02: 500 mL via INTRAVENOUS
  Filled 2019-12-02: qty 500

## 2019-12-02 MED ORDER — DIPHENHYDRAMINE HCL 25 MG PO CAPS: 25.0000 mg | ORAL_CAPSULE | Freq: Four times a day (QID) | ORAL | Status: DC | PRN

## 2019-12-02 MED ORDER — CALCIUM CARBONATE ANTACID 500 MG PO CHEW
2.0000 | CHEWABLE_TABLET | ORAL | Status: AC
  Administered 2019-12-02: 400 mg via ORAL
  Filled 2019-12-02 (×2): qty 2

## 2019-12-02 MED ORDER — HEPARIN SODIUM (PORCINE) 1000 UNIT/ML IJ SOLN
1000.0000 [IU] | Freq: Once | INTRAMUSCULAR | Status: AC
  Administered 2019-12-02: 1000 [IU]

## 2019-12-02 MED ORDER — HEPARIN SODIUM (PORCINE) 1000 UNIT/ML IJ SOLN
INTRAMUSCULAR | Status: AC
  Filled 2019-12-02: qty 3

## 2019-12-02 MED ORDER — SODIUM CHLORIDE 0.9 % IV SOLN
2.0000 g | Freq: Once | INTRAVENOUS | Status: AC
  Administered 2019-12-02: 2 g via INTRAVENOUS
  Filled 2019-12-02: qty 20

## 2019-12-02 MED ORDER — SODIUM CHLORIDE 0.9 % IV SOLN
INTRAVENOUS | Status: AC
  Filled 2019-12-02 (×3): qty 200

## 2019-12-02 MED ORDER — ACETAMINOPHEN 325 MG PO TABS: 650.0000 mg | ORAL_TABLET | ORAL | Status: DC | PRN

## 2019-12-02 NOTE — Plan of Care (Signed)
  Problem: Education: Goal: Knowledge of General Education information will improve Description Including pain rating scale, medication(s)/side effects and non-pharmacologic comfort measures Outcome: Progressing   

## 2019-12-02 NOTE — Progress Notes (Signed)
Patient is off of the unit.  NIF will be performed later

## 2019-12-02 NOTE — Progress Notes (Signed)
Occupational Therapy Treatment Patient Details Name: Jason Riley MRN: [id number] DOB: October 03, 1929 Today's Date: 12/02/2019    History of present illness Pt is an 86 y/o male admittes secondary to increased weakness, trouble swallowing and weight loss. Thought to be secondary to myasthenia gravis flare. PMH includes myasthenia gravis, aortic stenosis, HTN, and prostate cancer.    OT comments  Pt continues to progress toward established OT goals. He washed up at the sink while standing, requiring supervision for safety. He ambulated to toilet with RW and supervision from therapist. During today's session, pt completed higher dynamic balance activity, making his bed, requiring minguard for safety. Pt will continue to benefit from skilled OT services to maximize safety and independence with ADL/IADL and functional mobility. Will continue to follow acutely and progress as tolerated.    Follow Up Recommendations  Home health OT;Supervision - Intermittent    Equipment Recommendations  None recommended by OT    Recommendations for Other Services      Precautions / Restrictions Precautions Precautions: Fall       Mobility Bed Mobility Overal bed mobility: Modified Independent                Transfers Overall transfer level: Needs assistance Equipment used: Rolling walker (2 wheeled) Transfers: Sit to/from Stand Sit to Stand: Supervision         General transfer comment: supervision for safety and IV line management    Balance Overall balance assessment: Needs assistance Sitting-balance support: Feet supported Sitting balance-Leahy Scale: Good     Standing balance support: No upper extremity supported;During functional activity Standing balance-Leahy Scale: Good Standing balance comment: good static standing balance unsupported, reliance of single to bilateral upper etremity support in with more dynamic level tasks                           ADL either  performed or assessed with clinical judgement   ADL Overall ADL's : Needs assistance/impaired     Grooming: Supervision/safety;Standing;Wash/dry hands;Wash/dry face;Oral care Grooming Details (indicate cue type and reason): completed at sink level                 Toilet Transfer: Supervision/safety;Ambulation;RW   Toileting- Clothing Manipulation and Hygiene: Supervision/safety;Sit to/from stand       Functional mobility during ADLs: Supervision/safety General ADL Comments: voided in toilet, grooming task standing at sink level     Vision       Perception     Praxis      Cognition Arousal/Alertness: Awake/alert Behavior During Therapy: WFL for tasks assessed/performed Overall Cognitive Status: Within Functional Limits for tasks assessed                                          Exercises Exercises: Other exercises Other Exercises Other Exercises: worked on more dynamic balance with IADL (making bed) minguard required during this activity   Shoulder Instructions       General Comments telebox not connected, RN approved mobility without tele    Pertinent Vitals/ Pain       Pain Assessment: No/denies pain  Home Living                                          Prior  Functioning/Environment              Frequency  Min 2X/week        Progress Toward Goals  OT Goals(current goals can now be found in the care plan section)  Progress towards OT goals: Progressing toward goals  Acute Rehab OT Goals Patient Stated Goal: to get home soon OT Goal Formulation: With patient Time For Goal Achievement: 12/09/19 Potential to Achieve Goals: Good ADL Goals Pt Will Perform Lower Body Bathing: with modified independence;sit to/from stand Pt Will Perform Lower Body Dressing: with modified independence;sit to/from stand Pt Will Perform Tub/Shower Transfer: with modified independence;Shower transfer;shower Hotel manager  will Perform Home Exercise Program: Increased strength;Right Upper extremity;Left upper extremity;With theraband;With written HEP provided;Independently  Plan Discharge plan remains appropriate;Frequency remains appropriate    Co-evaluation                 AM-PAC OT "6 Clicks" Daily Activity     Outcome Measure   Help from another person eating meals?: None Help from another person taking care of personal grooming?: A Little Help from another person toileting, which includes using toliet, bedpan, or urinal?: A Little Help from another person bathing (including washing, rinsing, drying)?: A Little Help from another person to put on and taking off regular upper body clothing?: None Help from another person to put on and taking off regular lower body clothing?: A Little 6 Click Score: 20    End of Session Equipment Utilized During Treatment: Rolling walker  OT Visit Diagnosis: Unsteadiness on feet (R26.81);Muscle weakness (generalized) (M62.81)   Activity Tolerance Patient tolerated treatment well   Patient Left in bed;with call bell/phone within reach;with bed alarm set   Nurse Communication Mobility status        Time: [id number] OT Time Calculation (min): 29 min  Charges: OT General Charges $OT Visit: 1 Visit OT Treatments $Self Care/Home Management : 23-37 mins  [place] Office: [place] 12/02/2019, 3:04 PM

## 2019-12-02 NOTE — Progress Notes (Signed)
Physical Therapy Treatment Patient Details Name: Jason Riley MRN: [id number] DOB: 12-Aug-1929 Today's Date: 12/02/2019    History of Present Illness Pt is an 86 y/o male admittes secondary to increased weakness, trouble swallowing and weight loss. Thought to be secondary to myasthenia gravis flare. PMH includes myasthenia gravis, aortic stenosis, HTN, and prostate cancer.     PT Comments    Pt continues to improve he was able to trial a distance without device to challenge his balance.  Continue to recommend HHPT at d/c home.     Follow Up Recommendations  Home health PT;Supervision for mobility/OOB     Equipment Recommendations  Other (comment)    Recommendations for Other Services       Precautions / Restrictions Precautions Precautions: Fall Restrictions Weight Bearing Restrictions: No    Mobility  Bed Mobility Overal bed mobility: Modified Independent       Supine to sit: Modified independent (Device/Increase time) Sit to supine: Modified independent (Device/Increase time)      Transfers Overall transfer level: Needs assistance Equipment used: Rolling walker (2 wheeled) Transfers: Sit to/from Stand Sit to Stand: Modified independent (Device/Increase time)         General transfer comment: supervision for safety and IV line management  Ambulation/Gait Ambulation/Gait assistance: Min guard;Supervision Gait Distance (Feet): 315 Feet(Performed 150 ft without device.) Assistive device: Rolling walker (2 wheeled) Gait Pattern/deviations: Step-through pattern;Decreased stride length;Trunk flexed Gait velocity: Decreased   General Gait Details: Patient ambulating well with RW, good safety awareness.  Cues for upper trunk control and posture,  Trialed 150 ft without device and he tolerated well with min guard assistance and no AD, with RW his is supervision.   Stairs             Wheelchair Mobility    Modified Rankin (Stroke Patients Only)        Balance Overall balance assessment: Needs assistance Sitting-balance support: Feet supported Sitting balance-Leahy Scale: Good     Standing balance support: No upper extremity supported;During functional activity Standing balance-Leahy Scale: Good Standing balance comment: good static standing balance unsupported, reliance of single to bilateral upper etremity support in with more dynamic level tasks                            Cognition Arousal/Alertness: Awake/alert Behavior During Therapy: WFL for tasks assessed/performed Overall Cognitive Status: Within Functional Limits for tasks assessed                                 General Comments: HIs is hard of hearing and sometimes needs things repeated.      Exercises General Exercises - Lower Extremity Hip ABduction/ADduction: AROM;Both;10 reps;Standing Hip Flexion/Marching: AROM;Both;10 reps;Standing Heel Raises: AROM;Both;10 reps;Strengthening;Standing Mini-Sqauts: AROM;Both;10 reps;Standing Other Exercises Other Exercises: worked on more dynamic balance with IADL (making bed) minguard required during this activity    General Comments General comments (skin integrity, edema, etc.): telebox not connected, RN approved mobility without tele      Pertinent Vitals/Pain Pain Assessment: No/denies pain    Home Living                      Prior Function            PT Goals (current goals can now be found in the care plan section) Acute Rehab PT Goals Patient Stated Goal: to get home  soon PT Goal Formulation: With patient Potential to Achieve Goals: Good Progress towards PT goals: Progressing toward goals    Frequency    Min 3X/week      PT Plan Current plan remains appropriate    Co-evaluation              AM-PAC PT "6 Clicks" Mobility   Outcome Measure  Help needed turning from your back to your side while in a flat bed without using bedrails?: None Help needed moving  from lying on your back to sitting on the side of a flat bed without using bedrails?: None Help needed moving to and from a bed to a chair (including a wheelchair)?: None Help needed standing up from a chair using your arms (e.g., wheelchair or bedside chair)?: None Help needed to walk in hospital room?: A Little Help needed climbing 3-5 steps with a railing? : A Little 6 Click Score: 22    End of Session Equipment Utilized During Treatment: Gait belt Activity Tolerance: Patient tolerated treatment well Patient left: in bed;with call bell/phone within reach;with bed alarm set;with family/visitor present Nurse Communication: Mobility status PT Visit Diagnosis: Other abnormalities of gait and mobility (R26.89);Muscle weakness (generalized) (M62.81)     Time: [id number] PT Time Calculation (min) (ACUTE ONLY): 17 min  Charges:  $Gait Training: 8-22 mins                     [name] , PTA Acute Rehabilitation Services Pager [phone] Office [phone]     Jason Riley 12/02/2019, 3:45 PM

## 2019-12-02 NOTE — Progress Notes (Addendum)
PT Cancellation Note  Patient Details Name: Jason Riley MRN: [id number] DOB: 1929-07-28   Cancelled Treatment:    Reason Eval/Treat Not Completed: (P) Medical issues which prohibited therapy(Pt off unit for plasmaphoresis tx.  Will f/u per POC.)   [name] [name] 12/02/2019, 2:25 PM  [name] , PTA Acute Rehabilitation Services Pager [phone] Office [phone]

## 2019-12-02 NOTE — Progress Notes (Signed)
NIV greater than -40 VC 3.2L

## 2019-12-02 NOTE — Progress Notes (Signed)
PROGRESS NOTE    Jason Riley  [id number] DOB: 1929/02/25 DOA: 11/24/2019 PCP: [name], MD   Brief Narrative: 86 year old who presented with generalized weakness.  He does have significant past medical history of myasthenia gravis, severe aortic stenosis, hypertension and hypothyroidism.  Patient reported generalized weakness and difficulty swallowing.  Apparently patient was unable to take his oral medication for the last 4 to 5 months.  For the last 3 months patient has had generalized weakness, difficulty swallowing, initially to solid food and subsequently thin liquids.  He reported 30 pounds weight loss over the last 3 months.  10 days prior to admission he refill his medication but his symptoms were persistent.  On his initial physical examination blood pressure 131/52 heart rate 70.  Patient was evaluated by neurology with recommendation of plasma exchange, hemodialysis catheter was placed by IR on February 3.  Patient underwent plasma exchange with no major complication.    Assessment & Plan:   Principal Problem:   Myasthenia gravis with acute exacerbation (HCC) Active Problems:   CKD (chronic kidney disease), stage III (HCC)   Coronary artery disease involving native heart   Benign essential HTN   Hypothyroid   Esophageal dysphagia   Severe aortic stenosis   Weakness generalized   Presbycusis of both ears   Protein-calorie malnutrition, severe   1-acute myasthenia gravis exacerbation: Patient is slowly improving.  NIF -40 vital capacity 2.9. Continue medical therapy with plasmapheresis last a scheduled for Friday. Needed with dysphagia 3 diet. Continue with mycophenolate and pyridostigmine.  2-severe protein caloric malnutrition: Continue with nutritional supplements.  3-Hypertension: Blood pressure soft hold hydrochlorothiazide due to AKI.  4-Hypothyroidism: Continue with levothyroxine.  5-CKD stage IIIb serum creatinine slightly increased up to 1.7. Hold  hydrochlorothiazide.  Gentle hydration.  Repeat labs in the morning.  6-Dyslipidemia: Continue with statins.   Nutrition Problem: Severe Malnutrition Etiology: chronic illness(myasthenia gravis and dysphagia)    Signs/Symptoms: percent weight loss, energy intake < or equal to 75% for > or equal to 1 month, moderate fat depletion, severe muscle depletion, moderate muscle depletion, severe fat depletion Percent weight loss: 11.6 %    Interventions: Magic cup, MVI, Ensure Enlive (each supplement provides 350kcal and 20 grams of protein)  Estimated body mass index is 19.21 kg/m as calculated from the following:   Height as of this encounter: 5\' 9"  (1.753 m).   Weight as of this encounter: 59 kg.   DVT prophylaxis: Heparin Code Status: Full code Family Communication: Discussed with patient Disposition Plan:  Patient is from: Home Anticipated d/c date: Friday after plasmapheresis Barriers to d/c or necessity for inpatient status: Myasthenia  gravis undergoing plasma pheresis  Consultants:   Neurology  Procedures:   None for self  Antimicrobials:    Subjective: He is feeling better, he is tolerating diet.  Denies shortness of breath.  Objective: Vitals:   12/01/19 2022 12/01/19 2322 12/02/19 0100 12/02/19 0352  BP: 122/61 115/74  (!) 112/56  Pulse: 82 77  65  Resp: 18 16  16   Temp: 98.9 F (37.2 C) 99.7 F (37.6 C) 97.8 F (36.6 C) 97.8 F (36.6 C)  TempSrc: Oral Oral Oral Oral  SpO2: 97% 97%  98%  Weight:    59 kg  Height:        Intake/Output Summary (Last 24 hours) at 12/02/2019 0757 Last data filed at 12/02/2019 0645 Gross per 24 hour  Intake 200 ml  Output 640 ml  Net -440 ml  Filed Weights   11/30/19 0415 12/01/19 0500 12/02/19 0352  Weight: 60.3 kg 59.6 kg 59 kg    Examination:  General exam: Appears calm and comfortable  Respiratory system: Clear to auscultation. Respiratory effort normal. Cardiovascular system: S1 & S2 heard, RRR. No JVD,  murmurs, rubs, gallops or clicks. No pedal edema. Gastrointestinal system: Abdomen is nondistended, soft and nontender. No organomegaly or masses felt. Normal bowel sounds heard. Central nervous system: Alert and oriented.  Following commands Extremities: Symmetric 5 x 5 power. Skin: No rashes, lesions or ulcers Psychiatry: Judgement and insight appear normal. Mood & affect appropriate.     Data Reviewed: I have personally reviewed following labs and imaging studies  CBC: Recent Labs  Lab 11/28/19 0522 11/28/19 0522 11/29/19 0452 11/30/19 0131 11/30/19 1243 12/01/19 0150 12/02/19 0309  WBC 5.8  --  5.5 6.0  --  6.8 7.1  NEUTROABS 3.5  --  3.2 3.6  --  4.4 4.2  HGB 15.1   < > 14.3 14.3 15.0 14.2 12.6*  HCT 46.0   < > 43.0 43.3 44.0 42.8 37.4*  MCV 93.9  --  92.5 91.9  --  92.8 92.3  PLT 83*  --  80* 84*  --  85* 84*   < > = values in this interval not displayed.   Basic Metabolic Panel: Recent Labs  Lab 11/29/19 0452 11/29/19 0452 11/30/19 0131 11/30/19 1058 11/30/19 1243 12/01/19 0150 12/02/19 0309  NA 137   < > 138 136 137 137 135  K 4.2   < > 4.7 4.5 3.9 4.7 4.8  CL 103   < > 99 104 98 108 104  CO2 26  --  29 24  --  21* 26  GLUCOSE 99   < > 96 109* 129* 102* 105*  BUN 31*   < > 35* 33* 32* 28* 37*  CREATININE 1.50*   < > 1.66* 1.62* 1.60* 1.44* 1.73*  CALCIUM 9.3  --  9.8 9.3  --  9.3 9.3   < > = values in this interval not displayed.   GFR: Estimated Creatinine Clearance: 24.6 mL/min (A) (by C-G formula based on SCr of 1.73 mg/dL (H)). Liver Function Tests: Recent Labs  Lab 11/28/19 0522 11/29/19 0452 11/30/19 0131 12/01/19 0150 12/02/19 0309  AST 17 31 31 30 29   ALT 11 20 25 21 24   ALKPHOS 19* 30* 38 18* 27*  BILITOT 1.4* 1.1 1.4* 1.4* 1.0  PROT 4.5* 4.5* 5.0* 4.5* 4.7*  ALBUMIN 3.9 3.5 3.6 3.7 3.5   No results for input(s): LIPASE, AMYLASE in the last 168 hours. No results for input(s): AMMONIA in the last 168 hours. Coagulation  Profile: Recent Labs  Lab 11/28/19 0522 11/29/19 0452 11/30/19 0131 12/01/19 0150 12/02/19 0309  INR 1.5* 1.1 1.1 1.3* 1.1   Cardiac Enzymes: No results for input(s): CKTOTAL, CKMB, CKMBINDEX, TROPONINI in the last 168 hours. BNP (last 3 results) No results for input(s): PROBNP in the last 8760 hours. HbA1C: No results for input(s): HGBA1C in the last 72 hours. CBG: No results for input(s): GLUCAP in the last 168 hours. Lipid Profile: No results for input(s): CHOL, HDL, LDLCALC, TRIG, CHOLHDL, LDLDIRECT in the last 72 hours. Thyroid Function Tests: No results for input(s): TSH, T4TOTAL, FREET4, T3FREE, THYROIDAB in the last 72 hours. Anemia Panel: No results for input(s): VITAMINB12, FOLATE, FERRITIN, TIBC, IRON, RETICCTPCT in the last 72 hours. Sepsis Labs: No results for input(s): PROCALCITON, LATICACIDVEN in the last 168 hours.  Recent Results (from the past 240 hour(s))  SARS CORONAVIRUS 2 (TAT 6-24 HRS) Nasopharyngeal Nasopharyngeal Swab     Status: None   Collection Time: 11/24/19  3:12 PM   Specimen: Nasopharyngeal Swab  Result Value Ref Range Status   SARS Coronavirus 2 NEGATIVE NEGATIVE Final    Comment: (NOTE) SARS-CoV-2 target nucleic acids are NOT DETECTED. The SARS-CoV-2 RNA is generally detectable in upper and lower respiratory specimens during the acute phase of infection. Negative results do not preclude SARS-CoV-2 infection, do not rule out co-infections with other pathogens, and should not be used as the sole basis for treatment or other patient management decisions. Negative results must be combined with clinical observations, patient history, and epidemiological information. The expected result is Negative. Fact Sheet for Patients: SugarRoll.be Fact Sheet for Healthcare Providers: https://www.woods-mathews.com/ This test is not yet approved or cleared by the [place] FDA and  has been authorized for  detection and/or diagnosis of SARS-CoV-2 by FDA under an Emergency Use Authorization (EUA). This EUA will remain  in effect (meaning this test can be used) for the duration of the COVID-19 declaration under Section 56 4(b)(1) of the Act, 21 U.S.C. section 360bbb-3(b)(1), unless the authorization is terminated or revoked sooner. Performed at [facility], [place] [address] [place], [place] [address]   SARS CORONAVIRUS 2 (TAT 6-24 HRS) Nasopharyngeal Nasopharyngeal Swab     Status: None   Collection Time: 11/27/19  9:25 AM   Specimen: Nasopharyngeal Swab  Result Value Ref Range Status   SARS Coronavirus 2 NEGATIVE NEGATIVE Final    Comment: (NOTE) SARS-CoV-2 target nucleic acids are NOT DETECTED. The SARS-CoV-2 RNA is generally detectable in upper and lower respiratory specimens during the acute phase of infection. Negative results do not preclude SARS-CoV-2 infection, do not rule out co-infections with other pathogens, and should not be used as the sole basis for treatment or other patient management decisions. Negative results must be combined with clinical observations, patient history, and epidemiological information. The expected result is Negative. Fact Sheet for Patients: SugarRoll.be Fact Sheet for Healthcare Providers: https://www.woods-mathews.com/ This test is not yet approved or cleared by the [place] FDA and  has been authorized for detection and/or diagnosis of SARS-CoV-2 by FDA under an Emergency Use Authorization (EUA). This EUA will remain  in effect (meaning this test can be used) for the duration of the COVID-19 declaration under Section 56 4(b)(1) of the Act, 21 U.S.C. section 360bbb-3(b)(1), unless the authorization is terminated or revoked sooner. Performed at [facility], [place] [address], [place], [place] [address]          Radiology Studies: No results found.      Scheduled Meds: .  aspirin EC  81 mg Oral Daily  . Chlorhexidine Gluconate Cloth  6 each Topical Daily  . feeding supplement (ENSURE ENLIVE)  237 mL Oral TID BM  . heparin  1,000 Units Intracatheter Once  . heparin  5,000 Units Subcutaneous Q12H  . hydrochlorothiazide  12.5 mg Oral Daily  . levothyroxine  50 mcg Oral Daily  . magnesium hydroxide  5 mL Oral QPC lunch  . multivitamin with minerals  1 tablet Oral Daily  . mycophenolate  250 mg Oral BID  . pyridostigmine  30 mg Oral BID  . rosuvastatin  20 mg Oral Daily  . senna-docusate  1 tablet Oral BID   Continuous Infusions: . citrate dextrose       LOS: 8 days    Time spent:  35 minutes    Elmarie Shiley, MD [facility]   If 7PM-7AM, please contact night-coverage [email or web address]  12/02/2019, 7:57 AM

## 2019-12-02 NOTE — Progress Notes (Signed)
NIF is more negative than -40 and VC 2.9 L

## 2019-12-03 LAB — CBC WITH DIFFERENTIAL/PLATELET
Abs Immature Granulocytes: 0.03 10*3/uL (ref 0.00–0.07)
Basophils Absolute: 0.1 10*3/uL (ref 0.0–0.1)
Basophils Relative: 1 %
Eosinophils Absolute: 0.4 10*3/uL (ref 0.0–0.5)
Eosinophils Relative: 6 %
HCT: 38.9 % — ABNORMAL LOW (ref 39.0–52.0)
Hemoglobin: 12.7 g/dL — ABNORMAL LOW (ref 13.0–17.0)
Immature Granulocytes: 1 %
Lymphocytes Relative: 25 %
Lymphs Abs: 1.6 10*3/uL (ref 0.7–4.0)
MCH: 30.9 pg (ref 26.0–34.0)
MCHC: 32.6 g/dL (ref 30.0–36.0)
MCV: 94.6 fL (ref 80.0–100.0)
Monocytes Absolute: 0.5 10*3/uL (ref 0.1–1.0)
Monocytes Relative: 8 %
Neutro Abs: 3.8 10*3/uL (ref 1.7–7.7)
Neutrophils Relative %: 59 %
Platelets: 87 10*3/uL — ABNORMAL LOW (ref 150–400)
RBC: 4.11 MIL/uL — ABNORMAL LOW (ref 4.22–5.81)
RDW: 13.7 % (ref 11.5–15.5)
WBC: 6.4 10*3/uL (ref 4.0–10.5)
nRBC: 0 % (ref 0.0–0.2)

## 2019-12-03 LAB — COMPREHENSIVE METABOLIC PANEL
ALT: 22 U/L (ref 0–44)
AST: 28 U/L (ref 15–41)
Albumin: 3.9 g/dL (ref 3.5–5.0)
Alkaline Phosphatase: 20 U/L — ABNORMAL LOW (ref 38–126)
Anion gap: 7 (ref 5–15)
BUN: 29 mg/dL — ABNORMAL HIGH (ref 8–23)
CO2: 24 mmol/L (ref 22–32)
Calcium: 9.3 mg/dL (ref 8.9–10.3)
Chloride: 106 mmol/L (ref 98–111)
Creatinine, Ser: 1.54 mg/dL — ABNORMAL HIGH (ref 0.61–1.24)
GFR calc Af Amer: 46 mL/min — ABNORMAL LOW (ref >60)
GFR calc non Af Amer: 40 mL/min — ABNORMAL LOW (ref >60)
Glucose, Bld: 90 mg/dL (ref 70–99)
Potassium: 4.7 mmol/L (ref 3.5–5.1)
Sodium: 137 mmol/L (ref 135–145)
Total Bilirubin: 1.1 mg/dL (ref 0.3–1.2)
Total Protein: 4.4 g/dL — ABNORMAL LOW (ref 6.5–8.1)

## 2019-12-03 LAB — PROTIME-INR
INR: 1.4 — ABNORMAL HIGH (ref 0.8–1.2)
Prothrombin Time: 17.1 seconds — ABNORMAL HIGH (ref 11.4–15.2)

## 2019-12-03 MED ORDER — PYRIDOSTIGMINE BROMIDE 60 MG PO TABS
30.0000 mg | ORAL_TABLET | Freq: Four times a day (QID) | ORAL | Status: DC
  Administered 2019-12-03 – 2019-12-04 (×5): 30 mg via ORAL
  Filled 2019-12-03 (×6): qty 0.5

## 2019-12-03 MED ORDER — SENNOSIDES-DOCUSATE SODIUM 8.6-50 MG PO TABS: 1.0000 | ORAL_TABLET | Freq: Every evening | ORAL | Status: DC | PRN

## 2019-12-03 MED ORDER — PREDNISONE 20 MG PO TABS
20.0000 mg | ORAL_TABLET | Freq: Every day | ORAL | Status: DC
  Administered 2019-12-04: 20 mg via ORAL
  Filled 2019-12-03: qty 1

## 2019-12-03 NOTE — TOC Initial Note (Signed)
Transition of Care [facility]) - Initial/Assessment Note    Patient Details  Name: Jason Riley MRN: [id number] Date of Birth: 04/25/1929  [facility] CM/SW Contact:    Geralynn Ochs, LCSW Phone Number: 12/03/2019, 3:49 PM  Clinical Narrative:    CSW received message that patient's niece had concern about patient's discharge, so CSW contacted to discuss. Patient's niece and her husband are currently sick with COVID and will likely not be well enough to pick the patient up when he is discharged. CSW contacted patient's facility, [facility], and they are excited that patient is going to be coming home soon. They do not have a Lucianne Lei that runs on the weekend, but they could send an Melburn Popper to pick up the patient to bring him home. CSW contacted patient's niece to update her, and inform her that hospital can also provide an Uber/Lift or a cab for the patient, but niece said she will call around to some church members to find someone who can come and pick her up.  CSW also received a call from [place] with patient's facility about home health services and additional in-home services that they can provide to assist patient. Patient will receive [facility] through [facility], and they can also provide additional care services through [facility] that will assist with refilling and providing the patient's medications for him to prevent further hospitalizations.   CSW to follow.               Expected Discharge Plan: [place] Barriers to Discharge: Continued Medical Work up   Patient Goals and CMS Choice Patient states their goals for this hospitalization and ongoing recovery are:: get back home CMS Medicare.gov Compare Post Acute Care list provided to:: Patient Choice offered to / list presented to : Patient  Expected Discharge Plan and Services Expected Discharge Plan: [place] Choice: [place] arrangements for the past  2 months: [place]: PT, OT [place] Agency: [facility] (formerly [employer]) Date [place]: 12/03/19   Representative spoke with at [place] Arrangements/Services Living arrangements for the past 2 months: [place] Lives with:: Self Patient language and need for interpreter reviewed:: No Do you feel safe going back to the place where you live?: Yes      Need for Family Participation in Patient Care: No (Comment) Care giver support system in place?: Yes (comment) Current home services: DME Criminal Activity/Legal Involvement Pertinent to Current Situation/Hospitalization: No - Comment as needed  Activities of Daily Living Home Assistive Devices/Equipment: Eyeglasses ADL Screening (condition at time of admission) Patient's cognitive ability adequate to safely complete daily activities?: Yes Is the patient deaf or have difficulty hearing?: Yes Does the patient have difficulty seeing, even when wearing glasses/contacts?: No Does the patient have difficulty concentrating, remembering, or making decisions?: No Patient able to express need for assistance with ADLs?: Yes Does the patient have difficulty dressing or bathing?: No Independently performs ADLs?: Yes (appropriate for developmental age) Does the patient have difficulty walking or climbing stairs?: Yes Weakness of Legs: Both Weakness of Arms/Hands: None  Permission Sought/Granted Permission sought to share information with : Facility Sport and exercise psychologist, Family Supports Permission granted to share information  with : Yes, Verbal Permission Granted  Share Information with NAME: Fredricka Bonine  Permission granted to share info w AGENCY: Media granted to share info w Relationship: Niece     Emotional Assessment              Admission diagnosis:  Myasthenia gravis ([place]) [G70.00] Patient  Active Problem List   Diagnosis Date Noted  . Protein-calorie malnutrition, severe 11/30/2019  . Severe aortic stenosis 11/27/2019  . Weakness generalized 11/27/2019  . Presbycusis of both ears 11/27/2019  . Esophageal dysphagia   . Myasthenia gravis with acute exacerbation ([place]) 05/02/2018  . AKI (acute kidney injury) ([place]) 05/02/2018  . Back pain 11/03/2015  . Carotid bruit 11/03/2015  . History of measles 11/03/2015  . CKD (chronic kidney disease), stage III ([place]) 11/03/2015  . Coronary artery disease involving native heart 11/03/2015  . DOE (dyspnea on exertion) 11/03/2015  . Hyperlipidemia 11/03/2015  . Hypothyroid 11/03/2015  . Kidney stone 11/03/2015  . History of prostate cancer 11/03/2015  . Right inguinal pain 11/03/2015  . Aortic stenosis with mitral and aortic insufficiency 08/15/2012  . Benign essential HTN 08/15/2012   PCP:  [name], MD Pharmacy:   [facility] [place], [place] [address] Phone: [phone] Fax: [phone]  [place], [place] [facility] [address] [place] [address] [place] [address] Phone: [phone] Fax: [phone]     Social Determinants of Health (SDOH) Interventions    Readmission Risk Interventions No flowsheet data found.

## 2019-12-03 NOTE — Progress Notes (Addendum)
NIF -40 VC 2.58L

## 2019-12-03 NOTE — Progress Notes (Signed)
Physical Therapy Treatment Patient Details Name: Jason Riley MRN: [id number] DOB: 1928/12/08 Today's Date: 12/03/2019    History of Present Illness Pt is an 86 y/o male admittes secondary to increased weakness, trouble swallowing and weight loss. Thought to be secondary to myasthenia gravis flare. PMH includes myasthenia gravis, aortic stenosis, HTN, and prostate cancer.     PT Comments    Pt performed gt without device for entire tx.  He presents with minor instability but no true LOB.  Pt continues to benefit from follow up HHPT but he is gaining strength and improving during this hospitalization.      Follow Up Recommendations  Home health PT;Supervision for mobility/OOB     Equipment Recommendations       Recommendations for Other Services       Precautions / Restrictions Precautions Precautions: Fall Restrictions Weight Bearing Restrictions: No    Mobility  Bed Mobility Overal bed mobility: Modified Independent Bed Mobility: Supine to Sit     Supine to sit: Modified independent (Device/Increase time)        Transfers Overall transfer level: Modified independent Equipment used: None Transfers: Sit to/from Stand Sit to Stand: Modified independent (Device/Increase time)            Ambulation/Gait Ambulation/Gait assistance: Supervision;Min guard(started out min guard but progressed to supervision.) Gait Distance (Feet): 320 Feet Assistive device: None Gait Pattern/deviations: Step-through pattern;Decreased stride length;Trunk flexed Gait velocity: Decreased   General Gait Details: Cues for upper trunk control and reciprocal armswing.  No overt LOB.   Stairs             Wheelchair Mobility    Modified Rankin (Stroke Patients Only)       Balance Overall balance assessment: Needs assistance Sitting-balance support: Feet supported Sitting balance-Leahy Scale: Good     Standing balance support: No upper extremity supported;During  functional activity Standing balance-Leahy Scale: Good                              Cognition Arousal/Alertness: Awake/alert Behavior During Therapy: WFL for tasks assessed/performed Overall Cognitive Status: Within Functional Limits for tasks assessed                                 General Comments: HIs is hard of hearing and sometimes needs things repeated.      Exercises      General Comments        Pertinent Vitals/Pain      Home Living                      Prior Function            PT Goals (current goals can now be found in the care plan section) Acute Rehab PT Goals Patient Stated Goal: to get home soon Potential to Achieve Goals: Good Progress towards PT goals: Progressing toward goals    Frequency    Min 3X/week      PT Plan Current plan remains appropriate    Co-evaluation              AM-PAC PT "6 Clicks" Mobility   Outcome Measure  Help needed turning from your back to your side while in a flat bed without using bedrails?: None Help needed moving from lying on your back to sitting on the side of a flat bed without  using bedrails?: None Help needed moving to and from a bed to a chair (including a wheelchair)?: None Help needed standing up from a chair using your arms (e.g., wheelchair or bedside chair)?: None Help needed to walk in hospital room?: A Little Help needed climbing 3-5 steps with a railing? : A Little 6 Click Score: 22    End of Session Equipment Utilized During Treatment: Gait belt Activity Tolerance: Patient tolerated treatment well Patient left: with call bell/phone within reach(seated on commode with call bell in reach.) Nurse Communication: Mobility status PT Visit Diagnosis: Other abnormalities of gait and mobility (R26.89);Muscle weakness (generalized) (M62.81)     Time: [id number] PT Time Calculation (min) (ACUTE ONLY): 13 min  Charges:  $Gait Training: 8-22 mins                      Jason Riley , PTA Acute Rehabilitation Services Pager [phone] Office [phone]     Jason Riley [name] 12/03/2019, 2:40 PM

## 2019-12-03 NOTE — Progress Notes (Signed)
Patient is eating lunch.  Will return to perform NIF and VC.

## 2019-12-03 NOTE — Progress Notes (Signed)
PROGRESS NOTE    Jason Riley  [id number] DOB: 1928-12-08 DOA: 11/24/2019 PCP: [name], MD   Brief Narrative: 86 year old who presented with generalized weakness.  He does have significant past medical history of myasthenia gravis, severe aortic stenosis, hypertension and hypothyroidism.  Patient reported generalized weakness and difficulty swallowing.  Apparently patient was unable to take his oral medication for the last 4 to 5 months.  For the last 3 months patient has had generalized weakness, difficulty swallowing, initially to solid food and subsequently thin liquids.  He reported 30 pounds weight loss over the last 3 months.  10 days prior to admission he refill his medication but his symptoms were persistent.  On his initial physical examination blood pressure 131/52 heart rate 70.  Patient was evaluated by neurology with recommendation of plasma exchange, hemodialysis catheter was placed by IR on February 3.  Patient underwent plasma exchange with no major complication.    Assessment & Plan:   Principal Problem:   Myasthenia gravis with acute exacerbation (HCC) Active Problems:   CKD (chronic kidney disease), stage III (HCC)   Coronary artery disease involving native heart   Benign essential HTN   Hypothyroid   Esophageal dysphagia   Severe aortic stenosis   Weakness generalized   Presbycusis of both ears   Protein-calorie malnutrition, severe   1-Acute Myasthenia Gravis Exacerbation: Patient is slowly improving.  NIF -40 vital capacity 3.2 L Continue medical therapy with plasmapheresis last a scheduled for Friday. Needed with dysphagia 3 diet. Continue with mycophenolate and pyridostigmine. Will follow neurology recommendation in regard further medications management.  Stable.   2-Severe Protein caloric malnutrition: Continue with nutritional supplements.  3-Hypertension: Blood pressure soft hold hydrochlorothiazide due to AKI.  4-Hypothyroidism: Continue  with levothyroxine.  5-CKD stage IIIb serum creatinine slightly increased up to 1.7. Prior cr level in the past 1.6---1.9 Hold hydrochlorothiazide.  Gentle hydration.   Improved , cr decrease to 1.5. NSL  6-Dyslipidemia: Continue with statins.   Nutrition Problem: Severe Malnutrition Etiology: chronic illness(myasthenia gravis and dysphagia)    Signs/Symptoms: percent weight loss, energy intake < or equal to 75% for > or equal to 1 month, moderate fat depletion, severe muscle depletion, moderate muscle depletion, severe fat depletion Percent weight loss: 11.6 %    Interventions: Magic cup, MVI, Ensure Enlive (each supplement provides 350kcal and 20 grams of protein)  Estimated body mass index is 19.44 kg/m as calculated from the following:   Height as of this encounter: 5\' 9"  (1.753 m).   Weight as of this encounter: 59.7 kg.   DVT prophylaxis: Heparin Code Status: Full code Family Communication: Discussed with patient, and niece over phone.  Disposition Plan:  Patient is from: In depending living.  Anticipated d/c date: Friday after plasmapheresis Barriers to d/c or necessity for inpatient status: Myasthenia  gravis undergoing plasma pheresis  Consultants:   Neurology  Procedures:   None for self  Antimicrobials:    Subjective: Denies cramps, he had BM today, loose.  He denies chest pain or dyspnea.   Objective: Vitals:   12/02/19 1955 12/02/19 2354 12/03/19 0426 12/03/19 0436  BP: (!) 127/53 113/61 118/61   Pulse: 71 77 68   Resp: 19 18 19    Temp: 97.7 F (36.5 C) 97.9 F (36.6 C) (!) 97.5 F (36.4 C)   TempSrc: Oral Oral Oral   SpO2: 98% 97% 98%   Weight:    59.7 kg  Height:        Intake/Output  Summary (Last 24 hours) at 12/03/2019 1145 Last data filed at 12/03/2019 0429 Gross per 24 hour  Intake 300 ml  Output 475 ml  Net -175 ml   Filed Weights   12/01/19 0500 12/02/19 0352 12/03/19 0436  Weight: 59.6 kg 59 kg 59.7 kg     Examination:  General exam: NAD Respiratory system: CTA Cardiovascular system: S 1, S 2 RRR. Gastrointestinal system: BS present, soft, nt, nd Central nervous system: Alert, follows command Extremities: no edema Skin: No rashes   Data Reviewed: I have personally reviewed following labs and imaging studies  CBC: Recent Labs  Lab 11/29/19 0452 11/29/19 0452 11/30/19 0131 11/30/19 1243 12/01/19 0150 12/02/19 0309 12/03/19 0304  WBC 5.5  --  6.0  --  6.8 7.1 6.4  NEUTROABS 3.2  --  3.6  --  4.4 4.2 3.8  HGB 14.3   < > 14.3 15.0 14.2 12.6* 12.7*  HCT 43.0   < > 43.3 44.0 42.8 37.4* 38.9*  MCV 92.5  --  91.9  --  92.8 92.3 94.6  PLT 80*  --  84*  --  85* 84* 87*   < > = values in this interval not displayed.   Basic Metabolic Panel: Recent Labs  Lab 11/30/19 0131 11/30/19 0131 11/30/19 1058 11/30/19 1243 12/01/19 0150 12/02/19 0309 12/03/19 0304  NA 138   < > 136 137 137 135 137  K 4.7   < > 4.5 3.9 4.7 4.8 4.7  CL 99   < > 104 98 108 104 106  CO2 29  --  24  --  21* 26 24  GLUCOSE 96   < > 109* 129* 102* 105* 90  BUN 35*   < > 33* 32* 28* 37* 29*  CREATININE 1.66*   < > 1.62* 1.60* 1.44* 1.73* 1.54*  CALCIUM 9.8  --  9.3  --  9.3 9.3 9.3   < > = values in this interval not displayed.   GFR: Estimated Creatinine Clearance: 28 mL/min (A) (by C-G formula based on SCr of 1.54 mg/dL (H)). Liver Function Tests: Recent Labs  Lab 11/29/19 0452 11/30/19 0131 12/01/19 0150 12/02/19 0309 12/03/19 0304  AST 31 31 30 29 28   ALT 20 25 21 24 22   ALKPHOS 30* 38 18* 27* 20*  BILITOT 1.1 1.4* 1.4* 1.0 1.1  PROT 4.5* 5.0* 4.5* 4.7* 4.4*  ALBUMIN 3.5 3.6 3.7 3.5 3.9   No results for input(s): LIPASE, AMYLASE in the last 168 hours. No results for input(s): AMMONIA in the last 168 hours. Coagulation Profile: Recent Labs  Lab 11/29/19 0452 11/30/19 0131 12/01/19 0150 12/02/19 0309 12/03/19 0304  INR 1.1 1.1 1.3* 1.1 1.4*   Cardiac Enzymes: No results for  input(s): CKTOTAL, CKMB, CKMBINDEX, TROPONINI in the last 168 hours. BNP (last 3 results) No results for input(s): PROBNP in the last 8760 hours. HbA1C: No results for input(s): HGBA1C in the last 72 hours. CBG: No results for input(s): GLUCAP in the last 168 hours. Lipid Profile: No results for input(s): CHOL, HDL, LDLCALC, TRIG, CHOLHDL, LDLDIRECT in the last 72 hours. Thyroid Function Tests: No results for input(s): TSH, T4TOTAL, FREET4, T3FREE, THYROIDAB in the last 72 hours. Anemia Panel: No results for input(s): VITAMINB12, FOLATE, FERRITIN, TIBC, IRON, RETICCTPCT in the last 72 hours. Sepsis Labs: No results for input(s): PROCALCITON, LATICACIDVEN in the last 168 hours.  Recent Results (from the past 240 hour(s))  SARS CORONAVIRUS 2 (TAT 6-24 HRS) Nasopharyngeal Nasopharyngeal Swab  Status: None   Collection Time: 11/24/19  3:12 PM   Specimen: Nasopharyngeal Swab  Result Value Ref Range Status   SARS Coronavirus 2 NEGATIVE NEGATIVE Final    Comment: (NOTE) SARS-CoV-2 target nucleic acids are NOT DETECTED. The SARS-CoV-2 RNA is generally detectable in upper and lower respiratory specimens during the acute phase of infection. Negative results do not preclude SARS-CoV-2 infection, do not rule out co-infections with other pathogens, and should not be used as the sole basis for treatment or other patient management decisions. Negative results must be combined with clinical observations, patient history, and epidemiological information. The expected result is Negative. Fact Sheet for Patients: SugarRoll.be Fact Sheet for Healthcare Providers: https://www.woods-mathews.com/ This test is not yet approved or cleared by the [place] FDA and  has been authorized for detection and/or diagnosis of SARS-CoV-2 by FDA under an Emergency Use Authorization (EUA). This EUA will remain  in effect (meaning this test can be used) for the  duration of the COVID-19 declaration under Section 56 4(b)(1) of the Act, 21 U.S.C. section 360bbb-3(b)(1), unless the authorization is terminated or revoked sooner. Performed at [facility], [place] [address] [place], [place] [address]   SARS CORONAVIRUS 2 (TAT 6-24 HRS) Nasopharyngeal Nasopharyngeal Swab     Status: None   Collection Time: 11/27/19  9:25 AM   Specimen: Nasopharyngeal Swab  Result Value Ref Range Status   SARS Coronavirus 2 NEGATIVE NEGATIVE Final    Comment: (NOTE) SARS-CoV-2 target nucleic acids are NOT DETECTED. The SARS-CoV-2 RNA is generally detectable in upper and lower respiratory specimens during the acute phase of infection. Negative results do not preclude SARS-CoV-2 infection, do not rule out co-infections with other pathogens, and should not be used as the sole basis for treatment or other patient management decisions. Negative results must be combined with clinical observations, patient history, and epidemiological information. The expected result is Negative. Fact Sheet for Patients: SugarRoll.be Fact Sheet for Healthcare Providers: https://www.woods-mathews.com/ This test is not yet approved or cleared by the [place] FDA and  has been authorized for detection and/or diagnosis of SARS-CoV-2 by FDA under an Emergency Use Authorization (EUA). This EUA will remain  in effect (meaning this test can be used) for the duration of the COVID-19 declaration under Section 56 4(b)(1) of the Act, 21 U.S.C. section 360bbb-3(b)(1), unless the authorization is terminated or revoked sooner. Performed at [facility], [place] [address], [place], [place] [address]          Radiology Studies: No results found.      Scheduled Meds: . aspirin EC  81 mg Oral Daily  . Chlorhexidine Gluconate Cloth  6 each Topical Daily  . feeding supplement (ENSURE ENLIVE)  237 mL Oral TID BM  . heparin  1,000 Units  Intracatheter Once  . heparin  5,000 Units Subcutaneous Q12H  . levothyroxine  50 mcg Oral Daily  . magnesium hydroxide  5 mL Oral QPC lunch  . multivitamin with minerals  1 tablet Oral Daily  . mycophenolate  250 mg Oral BID  . pyridostigmine  30 mg Oral BID  . rosuvastatin  20 mg Oral Daily   Continuous Infusions: . citrate dextrose       LOS: 9 days    Time spent: 35 minutes    Jonesha Tsuchiya A Shakura Cowing, MD [facility]   If 7PM-7AM, please contact night-coverage [email or web address]  12/03/2019, 11:45 AM

## 2019-12-03 NOTE — Progress Notes (Signed)
2.83 L - VC -45 NIF

## 2019-12-03 NOTE — Progress Notes (Signed)
NEUROLOGY PROGRESS NOTE  Subjective: Patient is doing significantly better.  He ate all of his breakfast which included bananas and pancakes.  He denies any problems swallowing.  He denies any weakness at this point.  Exam: Vitals:   12/02/19 2354 12/03/19 0426  BP: 113/61 118/61  Pulse: 77 68  Resp: 18 19  Temp: 97.9 F (36.6 C) (!) 97.5 F (36.4 C)  SpO2: 97% 98%    ROS General ROS: negative for - chills, fatigue, fever, night sweats, weight gain or weight loss Psychological ROS: negative for - behavioral disorder, hallucinations, memory difficulties, mood swings or suicidal ideation Ophthalmic ROS: Positive for -  double vision-chronic ENT ROS: negative for - epistaxis, nasal discharge, oral lesions, sore throat, tinnitus or vertigo Respiratory ROS: negative for - cough, hemoptysis, shortness of breath or wheezing Cardiovascular ROS: negative for - chest pain, dyspnea on exertion, edema or irregular heartbeat Gastrointestinal ROS: negative for - abdominal pain, diarrhea, hematemesis, nausea/vomiting or stool incontinence Genito-Urinary ROS: negative for - dysuria, hematuria, incontinence or urinary frequency/urgency Musculoskeletal ROS: negative for - joint swelling or muscular weakness Neurological ROS: as noted in HPI Dermatological ROS: negative for rash and skin lesion changes   Physical Exam  Constitutional: Appears well-developed and well-nourished.  Psych: Affect appropriate to situation Eyes: No scleral injection HENT: No OP obstrucion Head: Normocephalic.  Cardiovascular: Normal rate and regular rhythm.  Respiratory: Effort normal, non-labored breathing GI: Soft.  No distension. There is no tenderness.  Skin: WDI   Neuro:  Mental Status: Alert, oriented, thought content appropriate.  Speech fluent without evidence of aphasia.  Able to follow 3 step commands without difficulty. Cranial Nerves: II:  Visual fields grossly normal,  III,IV, VI: Patient holds his  left eye closed secondary to diplopia which is chronic, extra-ocular motions intact bilaterally pupils equal, round, reactive to light and accommodation V,VII: smile symmetric, facial light touch sensation normal bilaterally VIII: hearing normal bilaterally IX,X: Palate rises midline XI: bilateral shoulder shrug XII: midline tongue extension Motor: Right : Upper extremity   5/5    Left:     Upper extremity   5/5  Lower extremity   5/5     Lower extremity   5/5 Tone and bulk:normal tone throughout; no atrophy noted Sensory: Pinprick and light touch intact throughout, bilaterally Deep Tendon Reflexes: 2+ and symmetric throughout Plantars: Right: downgoing   Left: downgoing   Medications:  Scheduled: . aspirin EC  81 mg Oral Daily  . Chlorhexidine Gluconate Cloth  6 each Topical Daily  . feeding supplement (ENSURE ENLIVE)  237 mL Oral TID BM  . heparin  1,000 Units Intracatheter Once  . heparin  5,000 Units Subcutaneous Q12H  . levothyroxine  50 mcg Oral Daily  . magnesium hydroxide  5 mL Oral QPC lunch  . multivitamin with minerals  1 tablet Oral Daily  . mycophenolate  250 mg Oral BID  . pyridostigmine  30 mg Oral BID  . rosuvastatin  20 mg Oral Daily  . senna-docusate  1 tablet Oral BID   Continuous: . sodium chloride 75 mL/hr at 12/03/19 0051  . citrate dextrose      Pertinent Labs/Diagnostics: -Creatinine 1.54 -NIF greater than -40 -VC 3.2 L   Etta Quill PA-C [facility] [phone]  Assessment:  86 year old male with known myasthenia gravis who arrived to [facility] after having difficulty swallowing and generalized weakness due to missing his myasthenia medications for the last 4 to 5 months.  Overall presentation most consistent with  myasthenia gravis exacerbation.  Patient is now received 4 sessions of plasma exchange with 5 session to be given tomorrow.  Today's exam shows significant improvement.  Able to eat pancakes and all of his  breakfast.     Impression: -Myasthenia exacerbation in the setting of medication noncompliance.   Recommendations: -Respiratory therapy for vital capacities and NIF twice daily -Continue with speech therapy -Continue with plasma exchange tomorrow being his last dose -Continue CellCept - Start Prednisone 20mg  daily, watch blood sugar and monitor for increased blood pressure  -At this point may consider increasing Mestinon to 30 mg 4 times daily and close attention to patient secretions and/or diarrhea/abdominal pain, can gradually increase. -I spoke to the niece, who unfortunately is Covid right now, she states that he sees neurologist Dr. Melrose Nakayama who was sent him to the hospital.  She is going to call and make a quick follow-up appointment at discharged -We will likely need social work to assist in getting patient back to his independent living -Will need outpatient home therapy for strengthening    12/03/2019, 9:09 AM   NEUROHOSPITALIST ADDENDUM Performed a face to face diagnostic evaluation.   I have reviewed the contents of history and physical exam as documented by PA/ARNP/Resident and agree with above documentation.  I have discussed and formulated the above plan as documented. Edits to the note have been made as needed.  Patient with who presented with myasthenic crisis after being noncompliant on his medications.  He is doing much better now after receiving 4 doses of Plex, last dose  Tomorrow.  Since it will take a while for CellCept to be effective, will add 20 mg of prednisone which can be tapered down after he follows up with his neurologist Dr. Melrose Nakayama.  Can increase Mestinon to 30 mg 3-4 times a day, previously complained of cramps and diarrhea so we will be cautious with increasing dose.      Karena Addison Tavian Callander MD [facility] [phone]   If 7pm to 7am, please call on call as listed on AMION.

## 2019-12-03 NOTE — Progress Notes (Signed)
Nutrition Follow-up  DOCUMENTATION CODES:   Severe malnutrition in context of chronic illness  INTERVENTION:   Continue Ensure Enlive po TID, each supplement provides 350 kcal and 20 grams of protein  Continue Magic cup BID with meals, each supplement provides 290 kcal and 9 grams of protein  Continue MVI daily  Update dietary preferences  NUTRITION DIAGNOSIS:   Severe Malnutrition related to chronic illness(myasthenia gravis and dysphagia) as evidenced by percent weight loss, energy intake < or equal to 75% for > or equal to 1 month, moderate fat depletion, severe muscle depletion, moderate muscle depletion, severe fat depletion.  Ongoing.  GOAL:   Patient will meet greater than or equal to 90% of their needs  Progressing.   MONITOR:   PO intake, Supplement acceptance, Diet advancement, Weight trends, Labs  REASON FOR ASSESSMENT:   Malnutrition Screening Tool    ASSESSMENT:   Pt with a PMH significant of myasthenia gravis, severe aortic stenosis hypertension, hypothyroidism presented with increasing generalized weakness, trouble swallowing, weight loss. Pt admitted with myasthenia gravis exacerbation and dysphagia.  Pt upgraded to DYS 3 diet with thin liquids on 2/5.   Discussed pt with RN who reports pt does really well with meals and supplements and reports pt ate 100% of breakfast this morning.  Pt reports appetite is good and that he is "doing the best I can" with consuming his meals. Pt states he likes the supplements he is receiving. Reviewed dietary preferences with pt.   PO intake: 10-80% x last 8 recorded meals (40% average intake)  Medications reviewed and include: Ensure Enlive TID, MVI with minerals, Milk of Magnesia, Deltasone  Labs reviewed.  UOP: 484ml x24 hours I/O: -1,419ml since admit  Diet Order:   Diet Order            DIET DYS 3 Room service appropriate? Yes with Assist; Fluid consistency: Thin  Diet effective now               EDUCATION NEEDS:   No education needs have been identified at this time  Skin:  Skin Assessment: Reviewed RN Assessment  Last BM:  2/9  Height:   Ht Readings from Last 1 Encounters:  11/25/19 5\' 9"  (1.753 m)    Weight:   Wt Readings from Last 1 Encounters:  12/03/19 59.7 kg    BMI:  Body mass index is 19.44 kg/m.  Estimated Nutritional Needs:   Kcal:  1800-2000  Protein:  90-105 grams  Fluid:  >1.8L/d   Larkin Ina, MS, RD, LDN RD pager number and weekend/on-call pager number located in [place].

## 2019-12-04 ENCOUNTER — Inpatient Hospital Stay (HOSPITAL_COMMUNITY): Payer: Medicare Other

## 2019-12-04 HISTORY — PX: IR REMOVAL TUN CV CATH W/O FL: IMG2289

## 2019-12-04 LAB — CBC WITH DIFFERENTIAL/PLATELET
Abs Immature Granulocytes: 0 10*3/uL (ref 0.00–0.07)
Band Neutrophils: 0 %
Basophils Absolute: 0.1 10*3/uL (ref 0.0–0.1)
Basophils Relative: 1 %
Blasts: 0 %
Eosinophils Absolute: 0.4 10*3/uL (ref 0.0–0.5)
Eosinophils Relative: 6 %
HCT: 39.5 % (ref 39.0–52.0)
Hemoglobin: 12.8 g/dL — ABNORMAL LOW (ref 13.0–17.0)
Lymphocytes Relative: 23 %
Lymphs Abs: 1.5 10*3/uL (ref 0.7–4.0)
MCH: 30.8 pg (ref 26.0–34.0)
MCHC: 32.4 g/dL (ref 30.0–36.0)
MCV: 95.2 fL (ref 80.0–100.0)
Metamyelocytes Relative: 0 %
Monocytes Absolute: 0.5 10*3/uL (ref 0.1–1.0)
Monocytes Relative: 8 %
Myelocytes: 0 %
Neutro Abs: 4.2 10*3/uL (ref 1.7–7.7)
Neutrophils Relative %: 62 %
Other: 0 %
Platelets: 94 10*3/uL — ABNORMAL LOW (ref 150–400)
Promyelocytes Relative: 0 %
RBC: 4.15 MIL/uL — ABNORMAL LOW (ref 4.22–5.81)
RDW: 14.1 % (ref 11.5–15.5)
WBC: 6.7 10*3/uL (ref 4.0–10.5)
nRBC: 0 % (ref 0.0–0.2)
nRBC: 0 /100 WBC

## 2019-12-04 LAB — PROTIME-INR
INR: 1.1 (ref 0.8–1.2)
Prothrombin Time: 14.5 seconds (ref 11.4–15.2)

## 2019-12-04 LAB — COMPREHENSIVE METABOLIC PANEL
ALT: 33 U/L (ref 0–44)
AST: 37 U/L (ref 15–41)
Albumin: 4.1 g/dL (ref 3.5–5.0)
Alkaline Phosphatase: 31 U/L — ABNORMAL LOW (ref 38–126)
Anion gap: 8 (ref 5–15)
BUN: 25 mg/dL — ABNORMAL HIGH (ref 8–23)
CO2: 25 mmol/L (ref 22–32)
Calcium: 9.6 mg/dL (ref 8.9–10.3)
Chloride: 104 mmol/L (ref 98–111)
Creatinine, Ser: 1.55 mg/dL — ABNORMAL HIGH (ref 0.61–1.24)
GFR calc Af Amer: 46 mL/min — ABNORMAL LOW (ref >60)
GFR calc non Af Amer: 39 mL/min — ABNORMAL LOW (ref >60)
Glucose, Bld: 98 mg/dL (ref 70–99)
Potassium: 4.6 mmol/L (ref 3.5–5.1)
Sodium: 137 mmol/L (ref 135–145)
Total Bilirubin: 1.2 mg/dL (ref 0.3–1.2)
Total Protein: 5.1 g/dL — ABNORMAL LOW (ref 6.5–8.1)

## 2019-12-04 MED ORDER — PREDNISONE 20 MG PO TABS: 20.0000 mg | ORAL_TABLET | Freq: Every day | ORAL | 0 refills | Status: AC

## 2019-12-04 MED ORDER — CALCIUM GLUCONATE-NACL 2-0.675 GM/100ML-% IV SOLN
2.0000 g | Freq: Once | INTRAVENOUS | Status: AC
  Administered 2019-12-04: 2000 mg via INTRAVENOUS
  Filled 2019-12-04 (×2): qty 100

## 2019-12-04 MED ORDER — HEPARIN SODIUM (PORCINE) 1000 UNIT/ML IJ SOLN: 1000.0000 [IU] | Freq: Once | INTRAMUSCULAR | Status: DC

## 2019-12-04 MED ORDER — ADULT MULTIVITAMIN W/MINERALS CH: 1.0000 | ORAL_TABLET | Freq: Every day | ORAL | 0 refills | Status: AC

## 2019-12-04 MED ORDER — HEPARIN SODIUM (PORCINE) 1000 UNIT/ML IJ SOLN
INTRAMUSCULAR | Status: AC
  Filled 2019-12-04: qty 3

## 2019-12-04 MED ORDER — SODIUM CHLORIDE 0.9 % IV SOLN: 2.0000 g | Freq: Once | INTRAVENOUS | Status: DC

## 2019-12-04 MED ORDER — PYRIDOSTIGMINE BROMIDE 60 MG PO TABS: 30.0000 mg | ORAL_TABLET | Freq: Four times a day (QID) | ORAL | 0 refills | Status: AC

## 2019-12-04 MED ORDER — ACD FORMULA A 0.73-2.45-2.2 GM/100ML VI SOLN: 500.0000 mL | Status: DC

## 2019-12-04 MED ORDER — ACETAMINOPHEN 325 MG PO TABS: 650.0000 mg | ORAL_TABLET | ORAL | Status: DC | PRN

## 2019-12-04 MED ORDER — DIPHENHYDRAMINE HCL 25 MG PO CAPS: 25.0000 mg | ORAL_CAPSULE | Freq: Four times a day (QID) | ORAL | Status: DC | PRN

## 2019-12-04 MED ORDER — CALCIUM CARBONATE ANTACID 500 MG PO CHEW
CHEWABLE_TABLET | ORAL | Status: AC
  Administered 2019-12-04: 400 mg via ORAL
  Filled 2019-12-04: qty 4

## 2019-12-04 MED ORDER — SODIUM CHLORIDE 0.9 % IV SOLN
INTRAVENOUS | Status: AC
  Filled 2019-12-04 (×3): qty 200

## 2019-12-04 MED ORDER — ENSURE ENLIVE PO LIQD: 237.0000 mL | Freq: Three times a day (TID) | ORAL | 12 refills | Status: AC

## 2019-12-04 MED ORDER — ACD FORMULA A 0.73-2.45-2.2 GM/100ML VI SOLN
Status: AC
  Administered 2019-12-04: 500 mL via INTRAVENOUS
  Filled 2019-12-04: qty 500

## 2019-12-04 MED ORDER — CALCIUM CARBONATE ANTACID 500 MG PO CHEW: 2.0000 | CHEWABLE_TABLET | ORAL | Status: DC

## 2019-12-04 MED ORDER — MYCOPHENOLATE MOFETIL 250 MG PO CAPS: 250.0000 mg | ORAL_CAPSULE | Freq: Two times a day (BID) | ORAL | 0 refills | Status: AC

## 2019-12-04 MED ORDER — ACD FORMULA A 0.73-2.45-2.2 GM/100ML VI SOLN
Status: AC
  Filled 2019-12-04: qty 500

## 2019-12-04 NOTE — Progress Notes (Signed)
NIF -40 and VC 2.0 L

## 2019-12-04 NOTE — Progress Notes (Signed)
Discharged off floor via WC accompanied by volunteer, transportation by [facility]

## 2019-12-04 NOTE — Discharge Summary (Signed)
Physician Discharge Summary  Jason Riley ZOX:[id number] DOB: 12-Mar-1929 DOA: 11/24/2019  PCP: [name], MD  Admit date: 11/24/2019 Discharge date: 12/04/2019  Admitted From: Home  Disposition:  Home   Recommendations for Outpatient Follow-up:  1. Follow up with PCP in 1-2 weeks 2. Please obtain BMP/CBC in one week 3. Follow up with neurology for further adjustment of medications for Myasthenia.  4. Follow up with PCP to follow up on BP  Home Health; yes.   Discharge Condition: stable.  CODE STATUS: Full code Diet recommendation: Heart Healthy   Brief/Interim Summary: 86 year old who presented with generalized weakness.  He does have significant past medical history of myasthenia gravis, severe aortic stenosis, hypertension and hypothyroidism.  Patient reported generalized weakness and difficulty swallowing.  Apparently patient was unable to take his oral medication for the last 4 to 5 months.  For the last 3 months patient has had generalized weakness, difficulty swallowing, initially to solid food and subsequently thin liquids.  He reported 30 pounds weight loss over the last 3 months.  10 days prior to admission he refill his medication but his symptoms were persistent.  On his initial physical examination blood pressure 131/52 heart rate 70.  Patient was evaluated by neurology with recommendation of plasma exchange, hemodialysis catheter was placed by IR on February 3.  Patient underwent plasma exchange with no major complication.   1-Acute Myasthenia Gravis Exacerbation: Patient is slowly improving.  NIF -40 vital capacity 3.2 L Continue medical therapy with plasmapheresis last a scheduled for Friday. Needed with dysphagia 3 diet. Continue with mycophenolate 250 mg BID and pyridostigmine dose reduce to 30 mg to QID Started on prednisone, 20 mg daily.  Follow up with primary neurology for further adjustment. Marland Kitchen    2-Severe Protein caloric malnutrition: Continue with  nutritional supplements.  3-Hypertension: Blood pressure soft hold hydrochlorothiazide due to AKI. no needs for BP medications at this time.   4-Hypothyroidism: Continue with levothyroxine.  5-CKD stage IIIb serum creatinine slightly increased up to 1.7. Prior cr level in the past 1.6---1.9 Hold hydrochlorothiazide.  Gentle hydration.   Improved , cr decrease to 1.5. NSL  6-Dyslipidemia: Continue with statins.  7-Thrombocytopenia; stable.    Discharge Diagnoses:  Principal Problem:   Myasthenia gravis with acute exacerbation ([place]) Active Problems:   CKD (chronic kidney disease), stage III (HCC)   Coronary artery disease involving native heart   Benign essential HTN   Hypothyroid   Esophageal dysphagia   Severe aortic stenosis   Weakness generalized   Presbycusis of both ears   Protein-calorie malnutrition, severe    Discharge Instructions  Discharge Instructions    Diet - low sodium heart healthy   Complete by: As directed    Increase activity slowly   Complete by: As directed      Allergies as of 12/04/2019      Reactions   Atorvastatin Other (See Comments)   Elevated liver functions ("liver disorder")   Sulfa Antibiotics Rash      Medication List    STOP taking these medications   lisinopril-hydrochlorothiazide 10-12.5 MG tablet Commonly known as: ZESTORETIC     TAKE these medications   ASPIRIN LOW DOSE 81 MG EC tablet Generic drug: aspirin Take 81 mg by mouth daily.   feeding supplement (ENSURE ENLIVE) Liqd Take 237 mLs by mouth 3 (three) times daily between meals.   levothyroxine 50 MCG tablet Commonly known as: SYNTHROID Take 1 tablet (50 mcg total) by mouth daily.   multivitamin  with minerals Tabs tablet Take 1 tablet by mouth daily. Start taking on: December 05, 2019   mycophenolate 250 MG capsule Commonly known as: CELLCEPT Take 1 capsule (250 mg total) by mouth 2 (two) times daily.   predniSONE 20 MG tablet Commonly known as:  DELTASONE Take 1 tablet (20 mg total) by mouth daily with breakfast. Start taking on: December 05, 2019   pyridostigmine 60 MG tablet Commonly known as: MESTINON Take 0.5 tablets (30 mg total) by mouth 4 (four) times daily. What changed: how much to take   rosuvastatin 20 MG tablet Commonly known as: CRESTOR Take 1 tablet (20 mg total) by mouth daily.   VISINE OP Place 1 drop into both eyes daily.       Allergies  Allergen Reactions  . Atorvastatin Other (See Comments)    Elevated liver functions ("liver disorder")  . Sulfa Antibiotics Rash    Consultations: Neurology   Procedures/Studies: DG Chest 2 View  Result Date: 11/24/2019 CLINICAL DATA:  Dysphagia. EXAM: CHEST - 2 VIEW COMPARISON:  May 02, 2018 FINDINGS: Mild chronic appearing increased lung markings are seen. A stable subcentimeter calcified nodular opacity is seen overlying the lateral aspect of the mid right lung. There is no evidence of a pleural effusion or pneumothorax. The heart size and mediastinal contours are within normal limits. There is moderate severity calcification of the aortic arch. Multilevel degenerative changes seen throughout the thoracic spine. IMPRESSION: Stable exam without evidence of acute or active cardiopulmonary disease. Electronically Signed   By: Virgina Norfolk M.D.   On: 11/24/2019 17:11   IR Fluoro Guide CV Line Right  Result Date: 11/25/2019 INDICATION: 86 year old male referred for plasmapheresis catheter placement EXAM: TUNNELED CENTRAL VENOUS HEMODIALYSIS CATHETER PLACEMENT WITH ULTRASOUND AND FLUOROSCOPIC GUIDANCE MEDICATIONS: 2 g Ancef. The antibiotic was given in an appropriate time interval prior to skin puncture. ANESTHESIA/SEDATION: Moderate (conscious) sedation was employed during this procedure. A total of Versed 1.0 mg and Fentanyl 50 mcg was administered intravenously. Moderate Sedation Time: 14 minutes. The patient's level of consciousness and vital signs were monitored  continuously by radiology nursing throughout the procedure under my direct supervision. FLUOROSCOPY TIME:  Fluoroscopy Time: 0 minutes 12 seconds (5 mGy). COMPLICATIONS: None PROCEDURE: Informed written consent was obtained from the patient after a discussion of the risks, benefits, and alternatives to treatment. Questions regarding the procedure were encouraged and answered. The right neck and chest were prepped with chlorhexidine in a sterile fashion, and a sterile drape was applied covering the operative field. Maximum barrier sterile technique with sterile gowns and gloves were used for the procedure. A timeout was performed prior to the initiation of the procedure. After creating a small venotomy incision, a micropuncture kit was utilized to access the right internal jugular vein under direct, real-time ultrasound guidance after the overlying soft tissues were anesthetized with 1% lidocaine with epinephrine. Ultrasound image documentation was performed. The microwire was marked to measure appropriate internal catheter length. External tunneled length was estimated. A total tip to cuff length of 19 cm was selected. Skin and subcutaneous tissues of chest wall below the clavicle were generously infiltrated with 1% lidocaine for local anesthesia. A small stab incision was made with 11 blade scalpel. The selected hemodialysis catheter was tunneled in a retrograde fashion from the anterior chest wall to the venotomy incision. A guidewire was advanced to the level of the IVC and the micropuncture sheath was exchanged for a peel-away sheath. The catheter was then placed through the peel-away  sheath with tips ultimately positioned within the superior aspect of the right atrium. Final catheter positioning was confirmed and documented with a spot radiographic image. The catheter aspirates and flushes normally. The catheter was flushed with appropriate volume heparin dwells. The catheter exit site was secured with a  0-Prolene retention suture. The venotomy incision was closed Derma bond and sterile dressing. Dressings were applied at the chest wall. Patient tolerated the procedure well and remained hemodynamically stable throughout. No complications were encountered and no significant blood loss encountered. IMPRESSION: Status post right IJ tunneled hemodialysis catheter placement, for use of plasmapheresis. Signed, Dulcy Fanny. Dellia Nims, RPVI [facility] Radiology Electronically Signed   By: Corrie Mckusick D.O.   On: 11/25/2019 11:38   IR US Guide Vasc Access Right  Result Date: 11/25/2019 INDICATION: 86 year old male referred for plasmapheresis catheter placement EXAM: TUNNELED CENTRAL VENOUS HEMODIALYSIS CATHETER PLACEMENT WITH ULTRASOUND AND FLUOROSCOPIC GUIDANCE MEDICATIONS: 2 g Ancef. The antibiotic was given in an appropriate time interval prior to skin puncture. ANESTHESIA/SEDATION: Moderate (conscious) sedation was employed during this procedure. A total of Versed 1.0 mg and Fentanyl 50 mcg was administered intravenously. Moderate Sedation Time: 14 minutes. The patient's level of consciousness and vital signs were monitored continuously by radiology nursing throughout the procedure under my direct supervision. FLUOROSCOPY TIME:  Fluoroscopy Time: 0 minutes 12 seconds (5 mGy). COMPLICATIONS: None PROCEDURE: Informed written consent was obtained from the patient after a discussion of the risks, benefits, and alternatives to treatment. Questions regarding the procedure were encouraged and answered. The right neck and chest were prepped with chlorhexidine in a sterile fashion, and a sterile drape was applied covering the operative field. Maximum barrier sterile technique with sterile gowns and gloves were used for the procedure. A timeout was performed prior to the initiation of the procedure. After creating a small venotomy incision, a micropuncture kit was utilized to  access the right internal jugular vein under direct, real-time ultrasound guidance after the overlying soft tissues were anesthetized with 1% lidocaine with epinephrine. Ultrasound image documentation was performed. The microwire was marked to measure appropriate internal catheter length. External tunneled length was estimated. A total tip to cuff length of 19 cm was selected. Skin and subcutaneous tissues of chest wall below the clavicle were generously infiltrated with 1% lidocaine for local anesthesia. A small stab incision was made with 11 blade scalpel. The selected hemodialysis catheter was tunneled in a retrograde fashion from the anterior chest wall to the venotomy incision. A guidewire was advanced to the level of the IVC and the micropuncture sheath was exchanged for a peel-away sheath. The catheter was then placed through the peel-away sheath with tips ultimately positioned within the superior aspect of the right atrium. Final catheter positioning was confirmed and documented with a spot radiographic image. The catheter aspirates and flushes normally. The catheter was flushed with appropriate volume heparin dwells. The catheter exit site was secured with a 0-Prolene retention suture. The venotomy incision was closed Derma bond and sterile dressing. Dressings were applied at the chest wall. Patient tolerated the procedure well and remained hemodynamically stable throughout. No complications were encountered and no significant blood loss encountered. IMPRESSION: Status post right IJ tunneled hemodialysis catheter placement, for use of plasmapheresis. Signed, Dulcy Fanny. Dellia Nims, RPVI [facility] Radiology Electronically Signed   By: Corrie Mckusick D.O.   On: 11/25/2019 11:38   DG Swallowing Func-Speech Pathology  Result Date: 11/26/2019 Objective Swallowing Evaluation: Type  of Study: MBS-Modified Barium Swallow Study  Patient Details Name: JARIAN LONGORIA MRN:  [id number] Date of Birth: August 08, 1929 Today's Date: 11/26/2019 Time: SLP Start Time (ACUTE ONLY): 1305 -SLP Stop Time (ACUTE ONLY): 6433 SLP Time Calculation (min) (ACUTE ONLY): 18 min Past Medical History: Past Medical History: Diagnosis Date . CN (constipation)  . Elevated transaminase level  . History of measles  . Hypertension  . Hypothyroidism  . Myasthenia gravis ([place])  . Myasthenia gravis ([place])  . Prostate cancer [facility])   Prostatectomy 2001 . Severe aortic stenosis  Past Surgical History: Past Surgical History: Procedure Laterality Date . APPENDECTOMY  1955 . HERNIA REPAIR Right 2006  Dr. Jamal Collin . IR FLUORO GUIDE CV LINE LEFT  05/03/2018 . IR FLUORO GUIDE CV LINE RIGHT  11/25/2019 . IR US GUIDE VASC ACCESS RIGHT  05/03/2018 . IR US GUIDE VASC ACCESS RIGHT  11/25/2019 . PROSTATECTOMY  2001  Dr. Yves Dill HPI: Pt is an 86 y.o. male with medical history significant of myasthenia gravis, severe aortic stenosis hypertension, hypothyroidism who presented with increasing generalized weakness, trouble swallowing, weight loss. Chest x-ray of 11/24/19 was stable without evidence of acute or active cardiopulmonary disease.  Subjective: Pt was pleasant and alert Assessment / Plan / Recommendation CHL IP CLINICAL IMPRESSIONS 11/26/2019 Clinical Impression  Pt was seen for a modified barium swallow study and he presents with mild oropharyngeal dysphagia with resultant trace laryngeal penetration of thin liquid via straw sip on today's examination.  Laryngeal penetration was observed x1 during serial straw sips of thin liquid.  No aspiration was observed with any trials and no laryngeal penetration was observed with puree, soft solids, or cup sips of thin liquid despite challenging.  Oral phase was remarkable for moderately prolonged mastication and AP transport of soft solids, reduced lingual strength resulting in trace oral residue, and reduced lingual control resulting in premature spillage to the pharynx.  Pharyngeal phase was remarkable  for reduced BOT retraction and reduced hyolaryngeal elevation/excursion, resulting in mild vallecular and pyriform residue.  Pt appeared sensate to the residue and he was able to clear the majority of it given a spontaneous second swallow across trials.  Recommend continuation of Dysphagia 2 (fine chop) solids and thin liquids with medications whole or crushed in puree (per pt preference) and intermittent superversion to cue for the following compensatory strategies: 1) Small bites/sips 2) Slow rate of intake 3) Sit upright as possible.  SLP Visit Diagnosis Dysphagia, oropharyngeal phase (R13.12) Attention and concentration deficit following -- Frontal lobe and executive function deficit following -- Impact on safety and function Mild aspiration risk   CHL IP TREATMENT RECOMMENDATION 11/26/2019 Treatment Recommendations Therapy as outlined in treatment plan below   Prognosis 11/26/2019 Prognosis for Safe Diet Advancement Good Barriers to Reach Goals -- Barriers/Prognosis Comment -- CHL IP DIET RECOMMENDATION 11/26/2019 SLP Diet Recommendations Dysphagia 2 (Fine chop) solids;Thin liquid Liquid Administration via Cup Medication Administration Whole meds with puree Compensations Small sips/bites;Slow rate;Multiple dry swallows after each bite/sip;Minimize environmental distractions Postural Changes Seated upright at 90 degrees   CHL IP OTHER RECOMMENDATIONS 11/26/2019 Recommended Consults -- Oral Care Recommendations Oral care BID Other Recommendations --   CHL IP FOLLOW UP RECOMMENDATIONS 11/26/2019 Follow up Recommendations Home health SLP   CHL IP FREQUENCY AND DURATION 11/26/2019 Speech Therapy Frequency (ACUTE ONLY) min 2x/week Treatment Duration 2 weeks      CHL IP ORAL PHASE 11/26/2019 Oral Phase Impaired Oral - Pudding Teaspoon -- Oral - Pudding Cup -- Oral - Honey Teaspoon --  Oral - Honey Cup -- Oral - Nectar Teaspoon -- Oral - Nectar Cup -- Oral - Nectar Straw -- Oral - Thin Teaspoon Piecemeal swallowing;Premature spillage  Oral - Thin Cup Piecemeal swallowing;Lingual/palatal residue;Premature spillage Oral - Thin Straw Premature spillage;Lingual/palatal residue Oral - Puree Piecemeal swallowing;Decreased bolus cohesion;Delayed oral transit;Lingual/palatal residue Oral - Mech Soft Impaired mastication;Delayed oral transit;Lingual/palatal residue Oral - Regular -- Oral - Multi-Consistency -- Oral - Pill -- Oral Phase - Comment --  CHL IP PHARYNGEAL PHASE 11/26/2019 Pharyngeal Phase Impaired Pharyngeal- Pudding Teaspoon -- Pharyngeal -- Pharyngeal- Pudding Cup -- Pharyngeal -- Pharyngeal- Honey Teaspoon -- Pharyngeal -- Pharyngeal- Honey Cup -- Pharyngeal -- Pharyngeal- Nectar Teaspoon -- Pharyngeal -- Pharyngeal- Nectar Cup -- Pharyngeal -- Pharyngeal- Nectar Straw -- Pharyngeal -- Pharyngeal- Thin Teaspoon Delayed swallow initiation-pyriform sinuses;Reduced anterior laryngeal mobility;Reduced laryngeal elevation;Reduced tongue base retraction;Pharyngeal residue - valleculae;Pharyngeal residue - pyriform Pharyngeal Material does not enter airway Pharyngeal- Thin Cup Delayed swallow initiation-pyriform sinuses;Reduced anterior laryngeal mobility;Reduced laryngeal elevation;Reduced tongue base retraction;Pharyngeal residue - valleculae;Pharyngeal residue - pyriform Pharyngeal Material does not enter airway Pharyngeal- Thin Straw Delayed swallow initiation-pyriform sinuses;Reduced epiglottic inversion;Reduced anterior laryngeal mobility;Reduced laryngeal elevation;Reduced airway/laryngeal closure;Reduced tongue base retraction;Penetration/Aspiration during swallow;Pharyngeal residue - valleculae;Pharyngeal residue - pyriform Pharyngeal Material enters airway, remains ABOVE vocal cords and not ejected out Pharyngeal- Puree Delayed swallow initiation-vallecula;Reduced anterior laryngeal mobility;Reduced laryngeal elevation;Reduced tongue base retraction;Pharyngeal residue - pyriform;Pharyngeal residue - valleculae Pharyngeal Material does not  enter airway Pharyngeal- Mechanical Soft Delayed swallow initiation-vallecula;Reduced anterior laryngeal mobility;Reduced laryngeal elevation;Reduced tongue base retraction;Reduced pharyngeal peristalsis;Pharyngeal residue - valleculae;Pharyngeal residue - pyriform;Lateral channel residue Pharyngeal Material does not enter airway Pharyngeal- Regular -- Pharyngeal -- Pharyngeal- Multi-consistency -- Pharyngeal -- Pharyngeal- Pill -- Pharyngeal -- Pharyngeal Comment --  No flowsheet data found. Colin Mulders., M.S., CCC-SLP Acute Rehabilitation Services Office: [phone] [place] 11/26/2019, 2:59 PM              ECHOCARDIOGRAM COMPLETE  Result Date: 11/25/2019   ECHOCARDIOGRAM REPORT   Patient Name:   TETSUO COPPOLA Date of Exam: 11/25/2019 Medical Rec #:  [id number]    Height:       69.0 in Accession #:    [id number]   Weight:       133.2 lb Date of Birth:  Jun 04, 1929     BSA:          1.74 m Patient Age:    28 years     BP:           117/49 mmHg Patient Gender: M            HR:           59 bpm. Exam Location:  Inpatient Procedure: Color Doppler, 2D Echo and Cardiac Doppler Indications:    I35.0 Nonrheumatic aortic (valve) stenosis  History:        Patient has prior history of Echocardiogram examinations, most                 recent 05/03/2018. CAD; Risk Factors:Hypertension. Cancer.  Sonographer:    Jonelle Sidle Dance Referring Phys: [id number] [place]  1. The aortic valve is abnormal. Aortic valve regurgitation is mild to moderate. Very severe aortic valve stenosis. Aortic valve mean gradient measures 60.0 mmHg. Valve area calculation may be inaccurate due to suboptimal LVOT doppler. Consider cardiology consultation for very severe aortic valve stenosis.  2. The mitral valve is abnormal in structure. At least moderate, eccentric mitral valve regurgitation. No evidence of mitral  stenosis.  3. The tricuspid valve is normal in structure. Tricuspid valve regurgitation is moderate-severe.  4. Left ventricular  ejection fraction, by visual estimation, is 55 to 60%. The left ventricle has normal function. There is mildly increased left ventricular hypertrophy.  5. Elevated left ventricular end-diastolic pressure.  6. Left ventricular diastolic parameters are consistent with Grade II diastolic dysfunction (pseudonormalization).  7. Global right ventricle has normal systolic function.The right ventricular size is normal. No increase in right ventricular wall thickness.  8. Moderately elevated pulmonary artery systolic pressure.  9. Left atrial size was moderately dilated. 10. Right atrial size was mildly dilated. 11. Moderate mitral annular calcification. 12. There is severe calcifcation of the aortic valve. 13. The pulmonic valve was normal in structure. Pulmonic valve regurgitation is trivial. 14. The inferior vena cava is normal in size with greater than 50% respiratory variability, suggesting right atrial pressure of 3 mmHg. 15. The interatrial septum was not well visualized. FINDINGS  Left Ventricle: Left ventricular ejection fraction, by visual estimation, is 55 to 60%. The left ventricle has normal function. The left ventricle has no regional wall motion abnormalities. There is mildly increased left ventricular hypertrophy. Left ventricular diastolic parameters are consistent with Grade II diastolic dysfunction (pseudonormalization). Elevated left ventricular end-diastolic pressure. Right Ventricle: The right ventricular size is normal. No increase in right ventricular wall thickness. Global RV systolic function is has normal systolic function. The tricuspid regurgitant velocity is 3.23 m/s, and with an assumed right atrial pressure  of 3 mmHg, the estimated right ventricular systolic pressure is moderately elevated at 44.7 mmHg. Left Atrium: Left atrial size was moderately dilated. Right Atrium: Right atrial size was mildly dilated Pericardium: There is no evidence of pericardial effusion. Mitral Valve: The mitral  valve is abnormal. Moderate mitral annular calcification. Moderate mitral valve regurgitation. No evidence of mitral valve stenosis by observation. Tricuspid Valve: The tricuspid valve is normal in structure. Tricuspid valve regurgitation is moderate-severe. Aortic Valve: The aortic valve is abnormal. Aortic valve regurgitation is mild to moderate. Aortic regurgitation PHT measures 339 msec. Severe aortic stenosis is present. There is severe calcifcation of the aortic valve. Aortic valve mean gradient measures 60.0 mmHg. Aortic valve peak gradient measures 94.6 mmHg. Aortic valve area, by VTI measures 0.47 cm. Pulmonic Valve: The pulmonic valve was normal in structure. Pulmonic valve regurgitation is trivial. Pulmonic regurgitation is trivial. Aorta: The aortic root and ascending aorta are structurally normal, with no evidence of dilitation. Venous: The inferior vena cava is normal in size with greater than 50% respiratory variability, suggesting right atrial pressure of 3 mmHg. IAS/Shunts: The interatrial septum was not well visualized.  LEFT VENTRICLE PLAX 2D LVIDd:         4.22 cm  Diastology LVIDs:         2.96 cm  LV e' lateral:   9.83 cm/s LV PW:         1.03 cm  LV E/e' lateral: 11.9 LV IVS:        1.03 cm  LV e' medial:    5.18 cm/s LVOT diam:     2.30 cm  LV E/e' medial:  22.6 LV SV:         46 ml LV SV Index:   26.71 LVOT Area:     4.15 cm  RIGHT VENTRICLE             IVC RV Basal diam:  2.90 cm     IVC diam: 1.20 cm RV S prime:  10.90 cm/s TAPSE (M-mode): 2.1 cm LEFT ATRIUM             Index       RIGHT ATRIUM           Index LA diam:        3.60 cm 2.07 cm/m  RA Area:     20.00 cm LA Vol (A2C):   63.1 ml 36.30 ml/m RA Volume:   54.10 ml  31.12 ml/m LA Vol (A4C):   92.8 ml 53.39 ml/m LA Biplane Vol: 77.6 ml 44.64 ml/m  AORTIC VALVE AV Area (Vmax):    0.44 cm AV Area (Vmean):   0.47 cm AV Area (VTI):     0.47 cm AV Vmax:           486.33 cm/s AV Vmean:          349.000 cm/s AV VTI:             1.337 m AV Peak Grad:      94.6 mmHg AV Mean Grad:      60.0 mmHg LVOT Vmax:         51.70 cm/s LVOT Vmean:        39.500 cm/s LVOT VTI:          0.150 m LVOT/AV VTI ratio: 0.11 AI PHT:            339 msec  AORTA Ao Root diam: 3.50 cm Ao Asc diam:  3.40 cm MITRAL VALVE                         TRICUSPID VALVE MV Area (PHT): 2.93 cm              TR Peak grad:   41.7 mmHg MV PHT:        74.96 msec            TR Vmax:        323.00 cm/s MV Decel Time: 259 msec MV E velocity: 117.00 cm/s 103 cm/s  SHUNTS MV A velocity: 51.70 cm/s  70.3 cm/s Systemic VTI:  0.15 m MV E/A ratio:  2.26        1.5       Systemic Diam: 2.30 cm  Cherlynn Kaiser MD Electronically signed by Cherlynn Kaiser MD Signature Date/Time: 11/25/2019/5:57:02 PM    Final     Subjective: Alert, feeling well, eating lunch.    Discharge Exam: Vitals:   12/04/19 1010 12/04/19 1015  BP: (!) 95/51 (!) 98/57  Pulse: 67 69  Resp: 20 (!) 29  Temp: 97.8 F (36.6 C) 97.8 F (36.6 C)  SpO2:       General: Pt is alert, awake, not in acute distress Cardiovascular: RRR, S1/S2 +, no rubs, no gallops Respiratory: CTA bilaterally, no wheezing, no rhonchi Abdominal: Soft, NT, ND, bowel sounds + Extremities: no edema, no cyanosis    The results of significant diagnostics from this hospitalization (including imaging, microbiology, ancillary and laboratory) are listed below for reference.     Microbiology: Recent Results (from the past 240 hour(s))  SARS CORONAVIRUS 2 (TAT 6-24 HRS) Nasopharyngeal Nasopharyngeal Swab     Status: None   Collection Time: 11/24/19  3:12 PM   Specimen: Nasopharyngeal Swab  Result Value Ref Range Status   SARS Coronavirus 2 NEGATIVE NEGATIVE Final    Comment: (NOTE) SARS-CoV-2 target nucleic acids are NOT DETECTED. The SARS-CoV-2 RNA is generally detectable in upper and lower respiratory specimens  during the acute phase of infection. Negative results do not preclude SARS-CoV-2 infection, do not rule  out co-infections with other pathogens, and should not be used as the sole basis for treatment or other patient management decisions. Negative results must be combined with clinical observations, patient history, and epidemiological information. The expected result is Negative. Fact Sheet for Patients: SugarRoll.be Fact Sheet for Healthcare Providers: https://www.woods-mathews.com/ This test is not yet approved or cleared by the [place] FDA and  has been authorized for detection and/or diagnosis of SARS-CoV-2 by FDA under an Emergency Use Authorization (EUA). This EUA will remain  in effect (meaning this test can be used) for the duration of the COVID-19 declaration under Section 56 4(b)(1) of the Act, 21 U.S.C. section 360bbb-3(b)(1), unless the authorization is terminated or revoked sooner. Performed at [facility], [place] [address] [place], [place] [address]   SARS CORONAVIRUS 2 (TAT 6-24 HRS) Nasopharyngeal Nasopharyngeal Swab     Status: None   Collection Time: 11/27/19  9:25 AM   Specimen: Nasopharyngeal Swab  Result Value Ref Range Status   SARS Coronavirus 2 NEGATIVE NEGATIVE Final    Comment: (NOTE) SARS-CoV-2 target nucleic acids are NOT DETECTED. The SARS-CoV-2 RNA is generally detectable in upper and lower respiratory specimens during the acute phase of infection. Negative results do not preclude SARS-CoV-2 infection, do not rule out co-infections with other pathogens, and should not be used as the sole basis for treatment or other patient management decisions. Negative results must be combined with clinical observations, patient history, and epidemiological information. The expected result is Negative. Fact Sheet for Patients: SugarRoll.be Fact Sheet for Healthcare Providers: https://www.woods-mathews.com/ This test is not yet approved or cleared by the [place] FDA and   has been authorized for detection and/or diagnosis of SARS-CoV-2 by FDA under an Emergency Use Authorization (EUA). This EUA will remain  in effect (meaning this test can be used) for the duration of the COVID-19 declaration under Section 56 4(b)(1) of the Act, 21 U.S.C. section 360bbb-3(b)(1), unless the authorization is terminated or revoked sooner. Performed at [facility], [place] [address], [place], [place] [address]      Labs: BNP (last 3 results) No results for input(s): BNP in the last 8760 hours. Basic Metabolic Panel: Recent Labs  Lab 11/30/19 1058 11/30/19 1058 11/30/19 1243 12/01/19 0150 12/02/19 0309 12/03/19 0304 12/04/19 0320  NA 136   < > 137 137 135 137 137  K 4.5   < > 3.9 4.7 4.8 4.7 4.6  CL 104   < > 98 108 104 106 104  CO2 24  --   --  21* 26 24 25   GLUCOSE 109*   < > 129* 102* 105* 90 98  BUN 33*   < > 32* 28* 37* 29* 25*  CREATININE 1.62*   < > 1.60* 1.44* 1.73* 1.54* 1.55*  CALCIUM 9.3  --   --  9.3 9.3 9.3 9.6   < > = values in this interval not displayed.   Liver Function Tests: Recent Labs  Lab 11/30/19 0131 12/01/19 0150 12/02/19 0309 12/03/19 0304 12/04/19 0320  AST 31 30 29 28  37  ALT 25 21 24 22  33  ALKPHOS 38 18* 27* 20* 31*  BILITOT 1.4* 1.4* 1.0 1.1 1.2  PROT 5.0* 4.5* 4.7* 4.4* 5.1*  ALBUMIN 3.6 3.7 3.5 3.9 4.1   No results for input(s): LIPASE, AMYLASE in the last 168 hours. No results for input(s): AMMONIA in  the last 168 hours. CBC: Recent Labs  Lab 11/30/19 0131 11/30/19 0131 11/30/19 1243 12/01/19 0150 12/02/19 0309 12/03/19 0304 12/04/19 0320  WBC 6.0  --   --  6.8 7.1 6.4 6.7  NEUTROABS 3.6  --   --  4.4 4.2 3.8 4.2  HGB 14.3   < > 15.0 14.2 12.6* 12.7* 12.8*  HCT 43.3   < > 44.0 42.8 37.4* 38.9* 39.5  MCV 91.9  --   --  92.8 92.3 94.6 95.2  PLT 84*  --   --  85* 84* 87* 94*   < > = values in this interval not displayed.   Cardiac Enzymes: No results for input(s): CKTOTAL, CKMB, CKMBINDEX, TROPONINI  in the last 168 hours. BNP: Invalid input(s): POCBNP CBG: No results for input(s): GLUCAP in the last 168 hours. D-Dimer No results for input(s): DDIMER in the last 72 hours. Hgb A1c No results for input(s): HGBA1C in the last 72 hours. Lipid Profile No results for input(s): CHOL, HDL, LDLCALC, TRIG, CHOLHDL, LDLDIRECT in the last 72 hours. Thyroid function studies No results for input(s): TSH, T4TOTAL, T3FREE, THYROIDAB in the last 72 hours.  Invalid input(s): FREET3 Anemia work up No results for input(s): VITAMINB12, FOLATE, FERRITIN, TIBC, IRON, RETICCTPCT in the last 72 hours. Urinalysis    Component Value Date/Time   COLORURINE YELLOW 05/02/2018 1750   APPEARANCEUR HAZY (A) 05/02/2018 1750   LABSPEC 1.018 05/02/2018 1750   PHURINE 5.0 05/02/2018 1750   GLUCOSEU NEGATIVE 05/02/2018 1750   HGBUR NEGATIVE 05/02/2018 1750   BILIRUBINUR NEGATIVE 05/02/2018 1750   KETONESUR NEGATIVE 05/02/2018 1750   PROTEINUR NEGATIVE 05/02/2018 1750   NITRITE NEGATIVE 05/02/2018 1750   LEUKOCYTESUR NEGATIVE 05/02/2018 1750   Sepsis Labs Invalid input(s): PROCALCITONIN,  WBC,  LACTICIDVEN Microbiology Recent Results (from the past 240 hour(s))  SARS CORONAVIRUS 2 (TAT 6-24 HRS) Nasopharyngeal Nasopharyngeal Swab     Status: None   Collection Time: 11/24/19  3:12 PM   Specimen: Nasopharyngeal Swab  Result Value Ref Range Status   SARS Coronavirus 2 NEGATIVE NEGATIVE Final    Comment: (NOTE) SARS-CoV-2 target nucleic acids are NOT DETECTED. The SARS-CoV-2 RNA is generally detectable in upper and lower respiratory specimens during the acute phase of infection. Negative results do not preclude SARS-CoV-2 infection, do not rule out co-infections with other pathogens, and should not be used as the sole basis for treatment or other patient management decisions. Negative results must be combined with clinical observations, patient history, and epidemiological information. The expected result  is Negative. Fact Sheet for Patients: SugarRoll.be Fact Sheet for Healthcare Providers: https://www.woods-mathews.com/ This test is not yet approved or cleared by the [place] FDA and  has been authorized for detection and/or diagnosis of SARS-CoV-2 by FDA under an Emergency Use Authorization (EUA). This EUA will remain  in effect (meaning this test can be used) for the duration of the COVID-19 declaration under Section 56 4(b)(1) of the Act, 21 U.S.C. section 360bbb-3(b)(1), unless the authorization is terminated or revoked sooner. Performed at [facility], [place] [address] [place], [place] [address]   SARS CORONAVIRUS 2 (TAT 6-24 HRS) Nasopharyngeal Nasopharyngeal Swab     Status: None   Collection Time: 11/27/19  9:25 AM   Specimen: Nasopharyngeal Swab  Result Value Ref Range Status   SARS Coronavirus 2 NEGATIVE NEGATIVE Final    Comment: (NOTE) SARS-CoV-2 target nucleic acids are NOT DETECTED. The SARS-CoV-2 RNA is generally detectable in upper and lower respiratory specimens  during the acute phase of infection. Negative results do not preclude SARS-CoV-2 infection, do not rule out co-infections with other pathogens, and should not be used as the sole basis for treatment or other patient management decisions. Negative results must be combined with clinical observations, patient history, and epidemiological information. The expected result is Negative. Fact Sheet for Patients: SugarRoll.be Fact Sheet for Healthcare Providers: https://www.woods-mathews.com/ This test is not yet approved or cleared by the [place] FDA and  has been authorized for detection and/or diagnosis of SARS-CoV-2 by FDA under an Emergency Use Authorization (EUA). This EUA will remain  in effect (meaning this test can be used) for the duration of the COVID-19 declaration under Section 56 4(b)(1) of the Act, 21  U.S.C. section 360bbb-3(b)(1), unless the authorization is terminated or revoked sooner. Performed at [facility], [place] [address] [place], [place] [address]      Time coordinating discharge: 40 minutes  SIGNED:   Elmarie Shiley, MD  [facility]

## 2019-12-04 NOTE — Progress Notes (Signed)
Pt's niece Denita Lung) called at [phone] following a call from her requesting an update. VM left at [phone]) number provided. Pt aware and was ok with updating to his niece.

## 2019-12-04 NOTE — Progress Notes (Signed)
Dialysis called for report, report given, transport immediately at door for pickup. Transported via bed. No distress noted

## 2019-12-04 NOTE — Plan of Care (Signed)
  Problem: Education: Goal: Knowledge of General Education information will improve Description Including pain rating scale, medication(s)/side effects and non-pharmacologic comfort measures Outcome: Progressing   

## 2019-12-04 NOTE — Progress Notes (Signed)
IR at bedside to remove tunnel cath. Removed, tolerated well. IV removed, cath tip intact. Tele DC'ed. OT at bedside to help get dressed. Niece Fredricka Bonine made aware that patient was ready for pickup

## 2019-12-04 NOTE — TOC Transition Note (Signed)
Transition of Care [facility]) - CM/SW Discharge Note   Patient Details  Name: FABION JEMMOTT MRN: [id number] Date of Birth: 10-09-1929  [facility] CM/SW Contact:  Pollie Friar, RN Phone Number: 12/04/2019, 12:30 PM   Clinical Narrative:    Pt discharging home with [facility] services through [facility]. Rollene Fare the community specialist for his ILF is aware of d/c. She will update the [facility] agency.  Pts niece has arranged transport home. Bedside RN just needs to let them know timing.    Final next level of care: [place] Barriers to Discharge: No Barriers Identified   Patient Goals and CMS Choice Patient states their goals for this hospitalization and ongoing recovery are:: get back home CMS Medicare.gov Compare Post Acute Care list provided to:: Patient Choice offered to / list presented to : Patient  Discharge Placement                       Discharge Plan and Services     Post Acute Care Choice: Home Health                    HH Arranged: PT, OT [facility] Agency: [facility] (formerly [employer]) Date [place]: 12/03/19   Representative spoke with at [place]: [place] Interventions     Readmission Risk Interventions No flowsheet data found.

## 2019-12-04 NOTE — Progress Notes (Signed)
Occupational Therapy Treatment Patient Details Name: Jason Riley MRN: [id number] DOB: 09-25-1929 Today's Date: 12/04/2019    History of present illness Pt is an 86 y/o male admittes secondary to increased weakness, trouble swallowing and weight loss. Thought to be secondary to myasthenia gravis flare. PMH includes myasthenia gravis, aortic stenosis, HTN, and prostate cancer.    OT comments  Pt making good progress towards OT goals this session. Overall, pt required supervision for UB/LB dressing and supervision to gather ADL items in closet. Pt completes functional mobility with supervision with no AD. Pt likely nearing baseline level of function. DC plan remains appropriate, will continue to follow acutely per POC.    Follow Up Recommendations  Home health OT;Supervision - Intermittent    Equipment Recommendations  None recommended by OT    Recommendations for Other Services      Precautions / Restrictions Precautions Precautions: Fall Restrictions Weight Bearing Restrictions: No       Mobility Bed Mobility Overal bed mobility: Modified Independent             General bed mobility comments: sitting EOB upon arrival  Transfers Overall transfer level: Independent Equipment used: None Transfers: Sit to/from Stand Sit to Stand: Independent         General transfer comment: no AD or physical assist needed; x sit<>stand from EOB during LB dressing    Balance Overall balance assessment: Mild deficits observed, not formally tested                                         ADL either performed or assessed with clinical judgement   ADL Overall ADL's : At baseline                 Upper Body Dressing : Set up;Sitting   Lower Body Dressing: Supervision/safety;Sit to/from stand   Toilet Transfer: Supervision/safety;Ambulation           Functional mobility during ADLs: Supervision/safety General ADL Comments: pt completed UB/LB dressing EOB  with supervision. pt able to gather all items in prep for DC with  no AD or LOB. pt likely back to baseline     Vision       Perception     Praxis      Cognition Arousal/Alertness: Awake/alert Behavior During Therapy: WFL for tasks assessed/performed Overall Cognitive Status: Within Functional Limits for tasks assessed                                 General Comments: [facility]        Exercises     Shoulder Instructions       General Comments      Pertinent Vitals/ Pain       Pain Assessment: No/denies pain  Home Living                                          Prior Functioning/Environment              Frequency  Min 2X/week        Progress Toward Goals  OT Goals(current goals can now be found in the care plan section)  Progress towards OT goals: Progressing toward goals  Acute Rehab OT Goals  Patient Stated Goal: to get home soon OT Goal Formulation: With patient Time For Goal Achievement: 12/09/19 Potential to Achieve Goals: Good  Plan Discharge plan remains appropriate;Frequency remains appropriate    Co-evaluation                 AM-PAC OT "6 Clicks" Daily Activity     Outcome Measure   Help from another person eating meals?: None Help from another person taking care of personal grooming?: None Help from another person toileting, which includes using toliet, bedpan, or urinal?: None Help from another person bathing (including washing, rinsing, drying)?: None Help from another person to put on and taking off regular upper body clothing?: None Help from another person to put on and taking off regular lower body clothing?: None 6 Click Score: 24    End of Session    OT Visit Diagnosis: Unsteadiness on feet (R26.81);Muscle weakness (generalized) (M62.81)   Activity Tolerance Patient tolerated treatment well   Patient Left in bed;with call bell/phone within reach;with nursing/sitter in room   Nurse  Communication          Time: 1451-1501 OT Time Calculation (min): 10 min  Charges: OT General Charges $OT Visit: 1 Visit OT Treatments $Self Care/Home Management : 8-22 mins  Lanier Clam., COTA/L Acute Rehabilitation Services [phone]    Ihor Gully 12/04/2019, 3:15 PM

## 2019-12-04 NOTE — Progress Notes (Signed)
Discharge teaching complete, verbal teach back. Two copies of instructions placed in envelope pe family request so niece (caregiver) can have a copy. Spoke with her on the phone about DC instructions, no questions or concerns.

## 2019-12-04 NOTE — Progress Notes (Addendum)
Physical Therapy Treatment Patient Details Name: Jason Riley MRN: [id number] DOB: 1929/10/14 Today's Date: 12/04/2019    History of Present Illness Pt is an 86 y/o male admittes secondary to increased weakness, trouble swallowing and weight loss. Thought to be secondary to myasthenia gravis flare. PMH includes myasthenia gravis, aortic stenosis, HTN, and prostate cancer.     PT Comments    Pt continues to make steady progress towards their physical therapy goals. Requiring grossly supervision for level surface ambulation x 350 feet. Requiring min guard for higher level balance I.e. side stepping and backwards walking. Continues with deconditioning/debility, weakness, balance impairments, and decreased endurance. D/c plan remains appropriate.    Follow Up Recommendations  Home health PT;Supervision for mobility/OOB     Equipment Recommendations  None recommended by PT    Recommendations for Other Services       Precautions / Restrictions Precautions Precautions: Fall Restrictions Weight Bearing Restrictions: No    Mobility  Bed Mobility Overal bed mobility: Modified Independent             General bed mobility comments: sitting EOB upon arrival  Transfers Overall transfer level: Independent Equipment used: None Transfers: Sit to/from Stand Sit to Stand: Independent           Ambulation/Gait Ambulation/Gait assistance: Supervision;Min guard Gait Distance (Feet): 350 Feet Assistive device: None Gait Pattern/deviations: Step-through pattern;Decreased stride length;Trunk flexed Gait velocity: Decreased   General Gait Details: Cues for upward gaze, pt with tendency for crouched posture with rounded shoulders and increased bilateral knee flexion   Stairs             Wheelchair Mobility    Modified Rankin (Stroke Patients Only)       Balance Overall balance assessment: Mild deficits observed, not formally tested                                           Cognition Arousal/Alertness: Awake/alert Behavior During Therapy: WFL for tasks assessed/performed Overall Cognitive Status: Within Functional Limits for tasks assessed                                 General Comments: HOH      Exercises Other Exercises Other Exercises: Dynamic balance exercises: side stepping, backwards walking    General Comments        Pertinent Vitals/Pain Pain Assessment: No/denies pain    Home Living                      Prior Function            PT Goals (current goals can now be found in the care plan section) Acute Rehab PT Goals Patient Stated Goal: to get home soon(Simultaneous filing. User may not have seen previous data.) Potential to Achieve Goals: Good Progress towards PT goals: Progressing toward goals    Frequency    Min 3X/week      PT Plan Current plan remains appropriate    Co-evaluation              AM-PAC PT "6 Clicks" Mobility   Outcome Measure  Help needed turning from your back to your side while in a flat bed without using bedrails?: None Help needed moving from lying on your back to sitting on the side of a  flat bed without using bedrails?: None Help needed moving to and from a bed to a chair (including a wheelchair)?: None Help needed standing up from a chair using your arms (e.g., wheelchair or bedside chair)?: None Help needed to walk in hospital room?: None Help needed climbing 3-5 steps with a railing? : A Little 6 Click Score: 23    End of Session Equipment Utilized During Treatment: Gait belt Activity Tolerance: Patient tolerated treatment well Patient left: in bed;with call bell/phone within reach   PT Visit Diagnosis: Other abnormalities of gait and mobility (R26.89);Muscle weakness (generalized) (M62.81)     Time: [id number] PT Time Calculation (min) (ACUTE ONLY): 24 min  Charges:  $Therapeutic Activity: 23-37 mins                        Wyona Almas, PT, DPT Acute Rehabilitation Services Pager [phone] Office [phone]    Deno Etienne 12/04/2019, 3:22 PM

## 2019-12-04 NOTE — Progress Notes (Signed)
Unable to complete NIF and VC at this time because patient is off of the unit.

## 2019-12-04 NOTE — Procedures (Signed)
Pre procedural Dx: Myasthenia Gravis Post procedural Dx: Same  Successful removal of tunneled right IJ tunneled plasmapheresis catheter.  EBL: None No immediate complications.  Hemostasis achieved using manual pressure. Sterile dressing placed over insertion site - dressing to remain x 24H, do not shower/submerge until removed.  Please see imaging section of Epic for full dictation.  Joaquim Nam PA-C 12/04/2019 2:54 PM

## 2019-12-07 ENCOUNTER — Telehealth: Payer: Self-pay

## 2019-12-07 NOTE — Telephone Encounter (Signed)
No HFU scheduled.  

## 2019-12-07 NOTE — Telephone Encounter (Signed)
I have made the 1st attempt to contact the patient or family member in charge, in order to follow up from recently being discharged from the hospital. I left a message on voicemail requesting a CB. -MM 

## 2019-12-08 NOTE — Telephone Encounter (Signed)
I have made the 2nd attempt to contact the patient or family member in charge, in order to follow up from recently being discharged from the hospital. I left a message on voicemail requesting a CB. -MM  

## 2019-12-09 ENCOUNTER — Telehealth: Payer: Self-pay

## 2019-12-09 DIAGNOSIS — G7000 Myasthenia gravis without (acute) exacerbation: Secondary | ICD-10-CM | POA: Diagnosis not present

## 2019-12-09 DIAGNOSIS — R0602 Shortness of breath: Secondary | ICD-10-CM | POA: Diagnosis not present

## 2019-12-09 DIAGNOSIS — R531 Weakness: Secondary | ICD-10-CM | POA: Diagnosis not present

## 2019-12-09 NOTE — Telephone Encounter (Signed)
Per CRM: niece Fredricka Bonine) called back on behalf of pt. She requested a CB. LMTCB on Fara's #. Ok to do so per [employer].

## 2019-12-09 NOTE — Telephone Encounter (Signed)
Copied from [place] [id number]. Topic: General - Other >> Dec 09, 2019 10:07 AM Yvette Rack wrote: Reason for CRM: Pt niece Fredricka Bonine returned call to the office. Attempted to transfer her to the office but the line remained busy. Fara requested pt be called back.

## 2019-12-10 ENCOUNTER — Telehealth: Payer: Self-pay | Admitting: Family Medicine

## 2019-12-10 DIAGNOSIS — G7001 Myasthenia gravis with (acute) exacerbation: Secondary | ICD-10-CM | POA: Diagnosis not present

## 2019-12-10 NOTE — Telephone Encounter (Signed)
Home Health Verbal Orders - Caller/Agency: Jeff/ [facility]  Callback Number: [phone] Requesting Nursing for disease management  Frequency: 1x a week for 6 weeks  And the hospital ask if they can draw BMP and CBC and fax to Dr. Caryn Section

## 2019-12-11 DIAGNOSIS — G7001 Myasthenia gravis with (acute) exacerbation: Secondary | ICD-10-CM | POA: Diagnosis not present

## 2019-12-11 NOTE — Telephone Encounter (Signed)
That's fine

## 2019-12-11 NOTE — Telephone Encounter (Signed)
Verbal orders given to [place] with [facility]

## 2019-12-14 DIAGNOSIS — G7000 Myasthenia gravis without (acute) exacerbation: Secondary | ICD-10-CM | POA: Diagnosis not present

## 2019-12-14 DIAGNOSIS — N183 Chronic kidney disease, stage 3 unspecified: Secondary | ICD-10-CM | POA: Diagnosis not present

## 2019-12-14 DIAGNOSIS — G7001 Myasthenia gravis with (acute) exacerbation: Secondary | ICD-10-CM | POA: Diagnosis not present

## 2019-12-14 LAB — CBC AND DIFFERENTIAL
HCT: 40 — AB (ref 41–53)
Hemoglobin: 13.5 (ref 13.5–17.5)
Platelets: 139 — AB (ref 150–399)
WBC: 10.6

## 2019-12-14 LAB — BASIC METABOLIC PANEL
BUN: 41 — AB (ref 4–21)
CO2: 20 (ref 13–22)
Chloride: 110 — AB (ref 99–108)
Creatinine: 1.7 — AB (ref 0.6–1.3)
Glucose: 121
Potassium: 5.2 (ref 3.4–5.3)
Sodium: 146 (ref 137–147)

## 2019-12-14 LAB — COMPREHENSIVE METABOLIC PANEL
Calcium: 10.2 (ref 8.7–10.7)
GFR calc Af Amer: 41
GFR calc non Af Amer: 35

## 2019-12-14 LAB — CBC: RBC: 4.35 (ref 3.87–5.11)

## 2019-12-14 NOTE — Telephone Encounter (Signed)
No return call. Closing encounter.

## 2019-12-16 ENCOUNTER — Telehealth: Payer: Self-pay | Admitting: Family Medicine

## 2019-12-16 NOTE — Telephone Encounter (Signed)
Left message giving verbal orders for OT at [facility].

## 2019-12-16 NOTE — Telephone Encounter (Signed)
Copied from [place] [id number]. Topic: General - Other >> Dec 16, 2019  8:35 AM Keene Breath wrote: Reason for CRM: Request verbal orders for OT - 2wk2 and 1wk3.  Please call to confirm at [phone]

## 2019-12-16 NOTE — Telephone Encounter (Signed)
That's fine

## 2019-12-17 DIAGNOSIS — G7001 Myasthenia gravis with (acute) exacerbation: Secondary | ICD-10-CM | POA: Diagnosis not present

## 2019-12-21 ENCOUNTER — Inpatient Hospital Stay (HOSPITAL_COMMUNITY)
Admission: EM | Admit: 2019-12-21 | Discharge: 2020-01-21 | DRG: 306 | Disposition: E | Payer: Medicare Other | Source: Skilled Nursing Facility | Attending: Internal Medicine | Admitting: Internal Medicine

## 2019-12-21 ENCOUNTER — Other Ambulatory Visit: Payer: Self-pay

## 2019-12-21 ENCOUNTER — Inpatient Hospital Stay (HOSPITAL_COMMUNITY): Payer: Medicare Other

## 2019-12-21 ENCOUNTER — Encounter (HOSPITAL_COMMUNITY): Payer: Self-pay | Admitting: Emergency Medicine

## 2019-12-21 ENCOUNTER — Emergency Department (HOSPITAL_COMMUNITY): Payer: Medicare Other

## 2019-12-21 DIAGNOSIS — Z7952 Long term (current) use of systemic steroids: Secondary | ICD-10-CM

## 2019-12-21 DIAGNOSIS — Z8546 Personal history of malignant neoplasm of prostate: Secondary | ICD-10-CM | POA: Diagnosis not present

## 2019-12-21 DIAGNOSIS — N1832 Chronic kidney disease, stage 3b: Secondary | ICD-10-CM | POA: Diagnosis not present

## 2019-12-21 DIAGNOSIS — R0602 Shortness of breath: Secondary | ICD-10-CM | POA: Diagnosis not present

## 2019-12-21 DIAGNOSIS — J9811 Atelectasis: Secondary | ICD-10-CM | POA: Diagnosis present

## 2019-12-21 DIAGNOSIS — Z20822 Contact with and (suspected) exposure to covid-19: Secondary | ICD-10-CM | POA: Diagnosis not present

## 2019-12-21 DIAGNOSIS — E872 Acidosis: Secondary | ICD-10-CM | POA: Diagnosis not present

## 2019-12-21 DIAGNOSIS — R64 Cachexia: Secondary | ICD-10-CM | POA: Diagnosis not present

## 2019-12-21 DIAGNOSIS — H9113 Presbycusis, bilateral: Secondary | ICD-10-CM | POA: Diagnosis not present

## 2019-12-21 DIAGNOSIS — Z7189 Other specified counseling: Secondary | ICD-10-CM

## 2019-12-21 DIAGNOSIS — G7000 Myasthenia gravis without (acute) exacerbation: Secondary | ICD-10-CM | POA: Diagnosis present

## 2019-12-21 DIAGNOSIS — Z8249 Family history of ischemic heart disease and other diseases of the circulatory system: Secondary | ICD-10-CM

## 2019-12-21 DIAGNOSIS — D72829 Elevated white blood cell count, unspecified: Secondary | ICD-10-CM

## 2019-12-21 DIAGNOSIS — R627 Adult failure to thrive: Secondary | ICD-10-CM | POA: Diagnosis present

## 2019-12-21 DIAGNOSIS — E785 Hyperlipidemia, unspecified: Secondary | ICD-10-CM | POA: Diagnosis present

## 2019-12-21 DIAGNOSIS — A0472 Enterocolitis due to Clostridium difficile, not specified as recurrent: Secondary | ICD-10-CM | POA: Diagnosis not present

## 2019-12-21 DIAGNOSIS — R778 Other specified abnormalities of plasma proteins: Secondary | ICD-10-CM | POA: Diagnosis not present

## 2019-12-21 DIAGNOSIS — R531 Weakness: Secondary | ICD-10-CM | POA: Diagnosis not present

## 2019-12-21 DIAGNOSIS — E861 Hypovolemia: Secondary | ICD-10-CM | POA: Diagnosis not present

## 2019-12-21 DIAGNOSIS — I5033 Acute on chronic diastolic (congestive) heart failure: Secondary | ICD-10-CM | POA: Diagnosis present

## 2019-12-21 DIAGNOSIS — Z66 Do not resuscitate: Secondary | ICD-10-CM | POA: Diagnosis not present

## 2019-12-21 DIAGNOSIS — R197 Diarrhea, unspecified: Secondary | ICD-10-CM

## 2019-12-21 DIAGNOSIS — I080 Rheumatic disorders of both mitral and aortic valves: Secondary | ICD-10-CM | POA: Diagnosis not present

## 2019-12-21 DIAGNOSIS — H9190 Unspecified hearing loss, unspecified ear: Secondary | ICD-10-CM | POA: Diagnosis present

## 2019-12-21 DIAGNOSIS — Z882 Allergy status to sulfonamides status: Secondary | ICD-10-CM

## 2019-12-21 DIAGNOSIS — Z888 Allergy status to other drugs, medicaments and biological substances status: Secondary | ICD-10-CM

## 2019-12-21 DIAGNOSIS — T17308A Unspecified foreign body in larynx causing other injury, initial encounter: Secondary | ICD-10-CM

## 2019-12-21 DIAGNOSIS — R0609 Other forms of dyspnea: Secondary | ICD-10-CM | POA: Diagnosis present

## 2019-12-21 DIAGNOSIS — D696 Thrombocytopenia, unspecified: Secondary | ICD-10-CM | POA: Diagnosis present

## 2019-12-21 DIAGNOSIS — R1312 Dysphagia, oropharyngeal phase: Secondary | ICD-10-CM | POA: Diagnosis present

## 2019-12-21 DIAGNOSIS — E039 Hypothyroidism, unspecified: Secondary | ICD-10-CM | POA: Diagnosis present

## 2019-12-21 DIAGNOSIS — Z79890 Hormone replacement therapy (postmenopausal): Secondary | ICD-10-CM

## 2019-12-21 DIAGNOSIS — Z9079 Acquired absence of other genital organ(s): Secondary | ICD-10-CM

## 2019-12-21 DIAGNOSIS — L8990 Pressure ulcer of unspecified site, unspecified stage: Secondary | ICD-10-CM | POA: Insufficient documentation

## 2019-12-21 DIAGNOSIS — I340 Nonrheumatic mitral (valve) insufficiency: Secondary | ICD-10-CM | POA: Diagnosis not present

## 2019-12-21 DIAGNOSIS — I350 Nonrheumatic aortic (valve) stenosis: Secondary | ICD-10-CM | POA: Diagnosis not present

## 2019-12-21 DIAGNOSIS — Z720 Tobacco use: Secondary | ICD-10-CM

## 2019-12-21 DIAGNOSIS — I352 Nonrheumatic aortic (valve) stenosis with insufficiency: Secondary | ICD-10-CM | POA: Diagnosis not present

## 2019-12-21 DIAGNOSIS — Z681 Body mass index (BMI) 19 or less, adult: Secondary | ICD-10-CM | POA: Diagnosis not present

## 2019-12-21 DIAGNOSIS — J9601 Acute respiratory failure with hypoxia: Secondary | ICD-10-CM | POA: Diagnosis present

## 2019-12-21 DIAGNOSIS — E43 Unspecified severe protein-calorie malnutrition: Secondary | ICD-10-CM | POA: Diagnosis present

## 2019-12-21 DIAGNOSIS — I700 Atherosclerosis of aorta: Secondary | ICD-10-CM | POA: Diagnosis present

## 2019-12-21 DIAGNOSIS — I130 Hypertensive heart and chronic kidney disease with heart failure and stage 1 through stage 4 chronic kidney disease, or unspecified chronic kidney disease: Secondary | ICD-10-CM | POA: Diagnosis present

## 2019-12-21 DIAGNOSIS — R0600 Dyspnea, unspecified: Secondary | ICD-10-CM | POA: Diagnosis not present

## 2019-12-21 DIAGNOSIS — Z515 Encounter for palliative care: Secondary | ICD-10-CM | POA: Diagnosis not present

## 2019-12-21 DIAGNOSIS — J90 Pleural effusion, not elsewhere classified: Secondary | ICD-10-CM | POA: Diagnosis not present

## 2019-12-21 DIAGNOSIS — N179 Acute kidney failure, unspecified: Secondary | ICD-10-CM | POA: Diagnosis not present

## 2019-12-21 DIAGNOSIS — Z7982 Long term (current) use of aspirin: Secondary | ICD-10-CM

## 2019-12-21 DIAGNOSIS — I959 Hypotension, unspecified: Secondary | ICD-10-CM | POA: Diagnosis not present

## 2019-12-21 DIAGNOSIS — E034 Atrophy of thyroid (acquired): Secondary | ICD-10-CM | POA: Diagnosis not present

## 2019-12-21 DIAGNOSIS — L89152 Pressure ulcer of sacral region, stage 2: Secondary | ICD-10-CM | POA: Diagnosis present

## 2019-12-21 DIAGNOSIS — J189 Pneumonia, unspecified organism: Secondary | ICD-10-CM

## 2019-12-21 DIAGNOSIS — Z79899 Other long term (current) drug therapy: Secondary | ICD-10-CM

## 2019-12-21 DIAGNOSIS — Z8619 Personal history of other infectious and parasitic diseases: Secondary | ICD-10-CM

## 2019-12-21 DIAGNOSIS — E860 Dehydration: Secondary | ICD-10-CM | POA: Diagnosis present

## 2019-12-21 DIAGNOSIS — I2510 Atherosclerotic heart disease of native coronary artery without angina pectoris: Secondary | ICD-10-CM | POA: Diagnosis not present

## 2019-12-21 DIAGNOSIS — N183 Chronic kidney disease, stage 3 unspecified: Secondary | ICD-10-CM | POA: Diagnosis present

## 2019-12-21 LAB — CBC
HCT: 44.2 % (ref 39.0–52.0)
HCT: 42.2 % (ref 39.0–52.0)
Hemoglobin: 13.8 g/dL (ref 13.0–17.0)
Hemoglobin: 13.7 g/dL (ref 13.0–17.0)
MCH: 30.9 pg (ref 26.0–34.0)
MCH: 31.5 pg (ref 26.0–34.0)
MCHC: 31.2 g/dL (ref 30.0–36.0)
MCHC: 32.5 g/dL (ref 30.0–36.0)
MCV: 98.9 fL (ref 80.0–100.0)
MCV: 97 fL (ref 80.0–100.0)
Platelets: 83 10*3/uL — ABNORMAL LOW (ref 150–400)
Platelets: 73 10*3/uL — ABNORMAL LOW (ref 150–400)
RBC: 4.47 MIL/uL (ref 4.22–5.81)
RBC: 4.35 MIL/uL (ref 4.22–5.81)
RDW: 17.7 % — ABNORMAL HIGH (ref 11.5–15.5)
RDW: 17.5 % — ABNORMAL HIGH (ref 11.5–15.5)
WBC: 14.8 10*3/uL — ABNORMAL HIGH (ref 4.0–10.5)
WBC: 12.3 10*3/uL — ABNORMAL HIGH (ref 4.0–10.5)
nRBC: 0.1 % (ref 0.0–0.2)
nRBC: 0 % (ref 0.0–0.2)

## 2019-12-21 LAB — BASIC METABOLIC PANEL
Anion gap: 13 (ref 5–15)
BUN: 91 mg/dL — ABNORMAL HIGH (ref 8–23)
CO2: 21 mmol/L — ABNORMAL LOW (ref 22–32)
Calcium: 9.9 mg/dL (ref 8.9–10.3)
Chloride: 107 mmol/L (ref 98–111)
Creatinine, Ser: 3.16 mg/dL — ABNORMAL HIGH (ref 0.61–1.24)
GFR calc Af Amer: 19 mL/min — ABNORMAL LOW (ref >60)
GFR calc non Af Amer: 17 mL/min — ABNORMAL LOW (ref >60)
Glucose, Bld: 119 mg/dL — ABNORMAL HIGH (ref 70–99)
Potassium: 5 mmol/L (ref 3.5–5.1)
Sodium: 141 mmol/L (ref 135–145)

## 2019-12-21 LAB — URINALYSIS, ROUTINE W REFLEX MICROSCOPIC
Bacteria, UA: NONE SEEN
Bilirubin Urine: NEGATIVE
Glucose, UA: NEGATIVE mg/dL
Ketones, ur: NEGATIVE mg/dL
Leukocytes,Ua: NEGATIVE
Nitrite: NEGATIVE
Protein, ur: 100 mg/dL — AB
Specific Gravity, Urine: 1.02 (ref 1.005–1.030)
pH: 5 (ref 5.0–8.0)

## 2019-12-21 LAB — TROPONIN I (HIGH SENSITIVITY)
Troponin I (High Sensitivity): 237 ng/L (ref <18)
Troponin I (High Sensitivity): 255 ng/L (ref <18)

## 2019-12-21 LAB — OSMOLALITY, URINE: Osmolality, Ur: 668 mOsm/kg (ref 300–900)

## 2019-12-21 LAB — LACTATE DEHYDROGENASE: LDH: 292 U/L — ABNORMAL HIGH (ref 98–192)

## 2019-12-21 LAB — LACTIC ACID, PLASMA
Lactic Acid, Venous: 2.3 mmol/L (ref 0.5–1.9)
Lactic Acid, Venous: 1.8 mmol/L (ref 0.5–1.9)

## 2019-12-21 LAB — CBG MONITORING, ED: Glucose-Capillary: 101 mg/dL — ABNORMAL HIGH (ref 70–99)

## 2019-12-21 LAB — SEDIMENTATION RATE: Sed Rate: 3 mm/hr (ref 0–16)

## 2019-12-21 LAB — BRAIN NATRIURETIC PEPTIDE: B Natriuretic Peptide: >4500 pg/mL — ABNORMAL HIGH (ref 0.0–100.0)

## 2019-12-21 LAB — FERRITIN: Ferritin: 242 ng/mL (ref 24–336)

## 2019-12-21 LAB — CREATININE, SERUM
Creatinine, Ser: 3.06 mg/dL — ABNORMAL HIGH (ref 0.61–1.24)
GFR calc Af Amer: 20 mL/min — ABNORMAL LOW (ref >60)
GFR calc non Af Amer: 17 mL/min — ABNORMAL LOW (ref >60)

## 2019-12-21 LAB — SODIUM, URINE, RANDOM: Sodium, Ur: 10 mmol/L

## 2019-12-21 LAB — FIBRINOGEN: Fibrinogen: 211 mg/dL (ref 210–475)

## 2019-12-21 LAB — SARS CORONAVIRUS 2 (TAT 6-24 HRS): SARS Coronavirus 2: NEGATIVE

## 2019-12-21 LAB — POC SARS CORONAVIRUS 2 AG -  ED: SARS Coronavirus 2 Ag: NEGATIVE

## 2019-12-21 LAB — HIV ANTIBODY (ROUTINE TESTING W REFLEX): HIV Screen 4th Generation wRfx: NONREACTIVE

## 2019-12-21 LAB — CREATININE, URINE, RANDOM: Creatinine, Urine: 121.59 mg/dL

## 2019-12-21 LAB — TSH: TSH: 2.97 u[IU]/mL (ref 0.350–4.500)

## 2019-12-21 LAB — C-REACTIVE PROTEIN: CRP: 1.2 mg/dL — ABNORMAL HIGH (ref <1.0)

## 2019-12-21 MED ORDER — PYRIDOSTIGMINE BROMIDE 60 MG PO TABS
30.0000 mg | ORAL_TABLET | Freq: Four times a day (QID) | ORAL | Status: DC
  Administered 2019-12-21 – 2019-12-23 (×8): 30 mg via ORAL
  Filled 2019-12-21 (×11): qty 0.5

## 2019-12-21 MED ORDER — ROSUVASTATIN CALCIUM 20 MG PO TABS
20.0000 mg | ORAL_TABLET | Freq: Every day | ORAL | Status: DC
  Administered 2019-12-22 – 2019-12-23 (×2): 20 mg via ORAL
  Filled 2019-12-21 (×2): qty 1

## 2019-12-21 MED ORDER — SODIUM CHLORIDE 0.9% FLUSH
3.0000 mL | Freq: Once | INTRAVENOUS | Status: AC
  Administered 2019-12-21: 3 mL via INTRAVENOUS

## 2019-12-21 MED ORDER — SODIUM CHLORIDE 0.9 % IV SOLN
2.0000 g | Freq: Once | INTRAVENOUS | Status: AC
  Administered 2019-12-21: 2 g via INTRAVENOUS
  Filled 2019-12-21: qty 20

## 2019-12-21 MED ORDER — SODIUM CHLORIDE 0.9 % IV SOLN
2.0000 g | INTRAVENOUS | Status: DC
  Administered 2019-12-22: 2 g via INTRAVENOUS
  Filled 2019-12-21: qty 20

## 2019-12-21 MED ORDER — SODIUM CHLORIDE 0.9 % IV BOLUS
1000.0000 mL | Freq: Once | INTRAVENOUS | Status: AC
  Administered 2019-12-21: 17:00:00 1000 mL via INTRAVENOUS

## 2019-12-21 MED ORDER — MYCOPHENOLATE MOFETIL 250 MG PO CAPS
250.0000 mg | ORAL_CAPSULE | Freq: Two times a day (BID) | ORAL | Status: DC
  Administered 2019-12-21 – 2019-12-22 (×2): 250 mg via ORAL
  Filled 2019-12-21 (×2): qty 1

## 2019-12-21 MED ORDER — ACETAMINOPHEN 650 MG RE SUPP: 650.0000 mg | Freq: Four times a day (QID) | RECTAL | Status: DC | PRN

## 2019-12-21 MED ORDER — ONDANSETRON HCL 4 MG PO TABS: 4.0000 mg | ORAL_TABLET | Freq: Four times a day (QID) | ORAL | Status: DC | PRN

## 2019-12-21 MED ORDER — ONDANSETRON HCL 4 MG/2ML IJ SOLN: 4.0000 mg | Freq: Four times a day (QID) | INTRAMUSCULAR | Status: DC | PRN

## 2019-12-21 MED ORDER — ASPIRIN EC 81 MG PO TBEC
81.0000 mg | DELAYED_RELEASE_TABLET | Freq: Every day | ORAL | Status: DC
  Administered 2019-12-22 – 2019-12-23 (×2): 81 mg via ORAL
  Filled 2019-12-21 (×2): qty 1

## 2019-12-21 MED ORDER — ENOXAPARIN SODIUM 30 MG/0.3ML ~~LOC~~ SOLN
30.0000 mg | SUBCUTANEOUS | Status: DC
  Administered 2019-12-21 – 2019-12-22 (×2): 30 mg via SUBCUTANEOUS
  Filled 2019-12-21 (×2): qty 0.3

## 2019-12-21 MED ORDER — ADULT MULTIVITAMIN W/MINERALS CH
1.0000 | ORAL_TABLET | Freq: Every day | ORAL | Status: DC
  Administered 2019-12-22 – 2019-12-23 (×2): 1 via ORAL
  Filled 2019-12-21 (×2): qty 1

## 2019-12-21 MED ORDER — BISACODYL 10 MG RE SUPP: 10.0000 mg | Freq: Every day | RECTAL | Status: DC | PRN

## 2019-12-21 MED ORDER — ACETAMINOPHEN 325 MG PO TABS: 650.0000 mg | ORAL_TABLET | Freq: Four times a day (QID) | ORAL | Status: DC | PRN

## 2019-12-21 MED ORDER — ENSURE ENLIVE PO LIQD
237.0000 mL | Freq: Three times a day (TID) | ORAL | Status: DC
  Administered 2019-12-22 – 2019-12-23 (×5): 237 mL via ORAL
  Filled 2019-12-21: qty 237

## 2019-12-21 MED ORDER — IPRATROPIUM-ALBUTEROL 0.5-2.5 (3) MG/3ML IN SOLN
3.0000 mL | Freq: Four times a day (QID) | RESPIRATORY_TRACT | Status: DC | PRN
  Filled 2019-12-21: qty 3

## 2019-12-21 MED ORDER — PREDNISONE 20 MG PO TABS
20.0000 mg | ORAL_TABLET | Freq: Every day | ORAL | Status: DC
  Administered 2019-12-22 – 2019-12-23 (×2): 20 mg via ORAL
  Filled 2019-12-21 (×2): qty 1

## 2019-12-21 MED ORDER — FUROSEMIDE 10 MG/ML IJ SOLN
40.0000 mg | Freq: Once | INTRAMUSCULAR | Status: AC
  Administered 2019-12-21: 40 mg via INTRAVENOUS
  Filled 2019-12-21: qty 4

## 2019-12-21 MED ORDER — IPRATROPIUM-ALBUTEROL 20-100 MCG/ACT IN AERS: 1.0000 | INHALATION_SPRAY | Freq: Four times a day (QID) | RESPIRATORY_TRACT | Status: DC | PRN

## 2019-12-21 MED ORDER — LEVOTHYROXINE SODIUM 50 MCG PO TABS
50.0000 ug | ORAL_TABLET | Freq: Every day | ORAL | Status: DC
  Administered 2019-12-22 – 2019-12-23 (×2): 50 ug via ORAL
  Filled 2019-12-21 (×3): qty 1

## 2019-12-21 MED ORDER — SODIUM CHLORIDE 0.9 % IV SOLN: INTRAVENOUS | Status: DC

## 2019-12-21 MED ORDER — SODIUM CHLORIDE 0.9 % IV SOLN: 500.0000 mg | Freq: Once | INTRAVENOUS | Status: DC

## 2019-12-21 MED ORDER — GUAIFENESIN ER 600 MG PO TB12
600.0000 mg | ORAL_TABLET | Freq: Two times a day (BID) | ORAL | Status: DC
  Administered 2019-12-21 – 2019-12-23 (×4): 600 mg via ORAL
  Filled 2019-12-21 (×5): qty 1

## 2019-12-21 MED ORDER — SENNOSIDES-DOCUSATE SODIUM 8.6-50 MG PO TABS: 1.0000 | ORAL_TABLET | Freq: Every evening | ORAL | Status: DC | PRN

## 2019-12-21 NOTE — H&P (Addendum)
History and Physical    Jason Riley [id number] DOB: October 26, 1928 DOA: 12/23/2019  PCP: [name], MD   Patient coming from: [place]   Chief Complaint: Shortness of Breath, Poor Po Intake, Fatigue   HPI: Jason Riley is a 86 y.o. male with medical history significant for but not limited to severe aortic stenosis, history of prostate cancer status post prostatectomy, myasthenia gravis, hyperlipidemia, hypothyroidism as well as other comorbidities who presents with a chief complaint of generalized weakness and dyspnea on exertion for the last week or so which is progressively gotten worse.  Per family he has not had proper oral intake and has not been as active.  Family noticed a marked decline in his oral intake and patient gets short of breath with minimal activity now.  He denies any chest pain, lightheadedness but family does admit that he has a dry hacking cough.  Of note patient was recently hospitalized 2 weeks ago for a myasthenia gravis exacerbation and had to have plasmapheresis and evaluated by neurology.  Over the last week or so he has not been feeling well.  TRH was asked to admit this patient for his generalized weakness and dyspnea on exertion as well as a new onset AKI on CKD stage III.  ED Course: In the ED had basic blood work done as well as a chest x-ray and EKG.  He was given 1 L bolus and started on antibiotics with IV ceftriaxone for suspected pneumonia.  Of note Covid testing was still pending  Review of Systems: As per HPI otherwise all other systems reviewed and negative.   Past Medical History:  Diagnosis Date  . CN (constipation)   . Elevated transaminase level   . History of measles   . Hypertension   . Hypothyroidism   . Myasthenia gravis ([place])   . Myasthenia gravis ([place])   . Prostate cancer [facility])    Prostatectomy 2001  . Severe aortic stenosis    Past Surgical History:  Procedure Laterality Date  . APPENDECTOMY  1955  . HERNIA  REPAIR Right 2006   Dr. Jamal Collin  . IR FLUORO GUIDE CV LINE LEFT  05/03/2018  . IR FLUORO GUIDE CV LINE RIGHT  11/25/2019  . IR REMOVAL TUN CV CATH W/O FL  12/04/2019  . IR US GUIDE VASC ACCESS RIGHT  05/03/2018  . IR US GUIDE VASC ACCESS RIGHT  11/25/2019  . PROSTATECTOMY  2001   Dr. Yves Dill   SOCIAL HISTORY  reports that he has quit smoking. His smoking use included cigars. He quit after 15.00 years of use. His smokeless tobacco use includes chew. He reports that he does not drink alcohol or use drugs.  Allergies  Allergen Reactions  . Atorvastatin Other (See Comments)    Elevated liver functions ("liver disorder")  . Other Other (See Comments)    PATIENT HAS NO DENTURES- PREPARED FOOD ACCORDINGLY  . Sulfa Antibiotics Rash   Family History  Problem Relation Age of Onset  . Heart disease Mother   . Colon cancer Father   . Obesity Sister    Prior to Admission medications   Medication Sig Start Date End Date Taking? Authorizing Provider  aspirin (ASPIRIN LOW DOSE) 81 MG EC tablet Take 81 mg by mouth daily.     [provider]  feeding supplement, ENSURE ENLIVE, (ENSURE ENLIVE) LIQD Take 237 mLs by mouth 3 (three) times daily between meals. 12/04/19   Regalado, Cassie Freer, MD  levothyroxine (SYNTHROID) 50  MCG tablet Take 1 tablet (50 mcg total) by mouth daily. 11/16/19   Mar Daring, PA-C  Multiple Vitamin (MULTIVITAMIN WITH MINERALS) TABS tablet Take 1 tablet by mouth daily. 12/05/19   Regalado, Belkys A, MD  mycophenolate (CELLCEPT) 250 MG capsule Take 1 capsule (250 mg total) by mouth 2 (two) times daily. 12/04/19   Regalado, Belkys A, MD  predniSONE (DELTASONE) 20 MG tablet Take 1 tablet (20 mg total) by mouth daily with breakfast. 12/05/19   Regalado, Belkys A, MD  pyridostigmine (MESTINON) 60 MG tablet Take 0.5 tablets (30 mg total) by mouth 4 (four) times daily. 12/04/19   Regalado, Belkys A, MD  rosuvastatin (CRESTOR) 20 MG tablet Take 1 tablet (20 mg total) by mouth daily.  11/16/19   Mar Daring, PA-C  Tetrahydrozoline HCl (VISINE OP) Place 1 drop into both eyes daily.    [provider]   Physical Exam: Vitals:   12/22/2019 1700 01/20/2020 1715 01/07/2020 1730 01/08/2020 1803  BP: (!) 129/55 126/62 (!) 141/66 138/63  Pulse: 88 88 87 89  Resp: (!) 0 (!) 22 18 15   Temp:      TempSrc:      SpO2: 94% 92% 100% 100%  Weight:      Height:       Constitutional: Thin frail elderly Caucasian male currently in mild distress appears calm Eyes: Lids and conjunctivae normal, sclerae anicteric; ENMT: External Ears, Nose appear normal.  Patient is extremely hard of hearing and mucous membranes are slightly dry.  Neck: Appears normal, supple, no cervical masses, normal ROM, no appreciable thyromegaly Respiratory: Diminished to auscultation bilaterally with coarse breath sounds and crackles no appreciable wheezing or rales. Normal respiratory effort and patient is not tachypenic. No accessory muscle use.  Wearing 2 L of supplemental oxygen via nasal cannula Cardiovascular: RRR, has a 3 out of 6 systolic murmur. S1 and S2 auscultated. No extremity edema.  Abdomen: Soft, non-tender, non-distended. Bowel sounds positive.  GU: Deferred. Musculoskeletal: No clubbing / cyanosis of digits/nails. No joint deformity upper and lower extremities.  Skin: No rashes, lesions, ulcers on limited skin evaluation but does have chronic senile changes and has seborrheic keratosis noted. No induration; Warm and dry.  Neurologic: CN 2-12 grossly intact with no focal deficits except that he is extremely hard of hearing Romberg sign cerebellar reflexes not assessed.  Psychiatric: Normal judgment and insight. Alert and oriented x 3. Normal mood and appropriate affect.   Labs on Admission: I have personally reviewed following labs and imaging studies  CBC: Recent Labs  Lab 01/10/2020 1313  WBC 14.8*  HGB 13.8  HCT 44.2  MCV 98.9  PLT 83*   Basic Metabolic Panel: Recent Labs  Lab  01/05/2020 1313  NA 141  K 5.0  CL 107  CO2 21*  GLUCOSE 119*  BUN 91*  CREATININE 3.16*  CALCIUM 9.9   GFR: Estimated Creatinine Clearance: 12.2 mL/min (A) (by C-G formula based on SCr of 3.16 mg/dL (H)). Liver Function Tests: No results for input(s): AST, ALT, ALKPHOS, BILITOT, PROT, ALBUMIN in the last 168 hours. No results for input(s): LIPASE, AMYLASE in the last 168 hours. No results for input(s): AMMONIA in the last 168 hours. Coagulation Profile: No results for input(s): INR, PROTIME in the last 168 hours. Cardiac Enzymes: No results for input(s): CKTOTAL, CKMB, CKMBINDEX, TROPONINI in the last 168 hours. BNP (last 3 results) No results for input(s): PROBNP in the last 8760 hours. HbA1C: No results for input(s): HGBA1C in  the last 72 hours. CBG: No results for input(s): GLUCAP in the last 168 hours. Lipid Profile: No results for input(s): CHOL, HDL, LDLCALC, TRIG, CHOLHDL, LDLDIRECT in the last 72 hours. Thyroid Function Tests: No results for input(s): TSH, T4TOTAL, FREET4, T3FREE, THYROIDAB in the last 72 hours. Anemia Panel: No results for input(s): VITAMINB12, FOLATE, FERRITIN, TIBC, IRON, RETICCTPCT in the last 72 hours. Urine analysis:    Component Value Date/Time   COLORURINE YELLOW 01/04/2020 1310   APPEARANCEUR HAZY (A) 01/02/2020 1310   LABSPEC 1.020 01/18/2020 1310   PHURINE 5.0 12/23/2019 1310   GLUCOSEU NEGATIVE 01/17/2020 1310   HGBUR SMALL (A) 01/11/2020 1310   BILIRUBINUR NEGATIVE 12/28/2019 1310   KETONESUR NEGATIVE 01/17/2020 1310   PROTEINUR 100 (A) 01/15/2020 1310   NITRITE NEGATIVE 12/23/2019 1310   LEUKOCYTESUR NEGATIVE 12/26/2019 1310   Sepsis Labs: !!!!!!!!!!!!!!!!!!!!!!!!!!!!!!!!!!!!!!!!!!!! @LABRCNTIP (procalcitonin:4,lacticidven:4) )No results found for this or any previous visit (from the past 240 hour(s)).   Radiological Exams on Admission: CT CHEST WO CONTRAST  Result Date: 12/27/2019 CLINICAL DATA:  Pneumonia.  Myasthenia  gravis. EXAM: CT CHEST WITHOUT CONTRAST TECHNIQUE: Multidetector CT imaging of the chest was performed following the standard protocol without IV contrast. COMPARISON:  Chest radiograph dated 01/10/2020. CT chest dated 04/16/2018 FINDINGS: Cardiovascular: Heart is normal in size.  No pericardial effusion. No evidence of thoracic aortic aneurysm. Dense calcifications of the aortic root/valve. Atherosclerotic calcifications of the aortic arch. Coronary atherosclerosis of the LAD and left circumflex. Mediastinum/Nodes: No suspicious medial lymphadenopathy. Visualized thyroid is unremarkable. Lungs/Pleura: Mild scattered ground-glass opacities in the lungs bilaterally, including in the bilateral suprahilar/perihilar regions, favoring mild interstitial edema over infection. Moderate bilateral pleural effusions. Associated mild lower lobe compressive atelectasis. Scattered peribronchovascular hyperdensities in the right middle and lower lobes favors sequela of prior aspiration mild biapical pleural-parenchymal scarring. No suspicious pulmonary nodules. No pneumothorax. Upper Abdomen: Visualized upper abdomen is notable for vascular calcifications, a 2.3 cm left upper pole renal cyst, and a 4 mm nonobstructing left renal calculus (series 3/image 168). Musculoskeletal: Degenerative changes of the visualized thoracolumbar spine. IMPRESSION: Suspected mild interstitial edema. Moderate bilateral pleural effusions. Associated mild lower lobe compressive atelectasis. No findings suspicious for pneumonia. Dense calcifications of the aortic root/valve in this patient with known aortic stenosis. Aortic Atherosclerosis (ICD10-I70.0). Electronically Signed   By: Julian Hy M.D.   On: 01/09/2020 17:51   DG Chest Portable 1 View  Result Date: 01/03/2020 CLINICAL DATA:  Shortness of breath. EXAM: PORTABLE CHEST 1 VIEW COMPARISON:  Chest radiograph 11/24/2019 FINDINGS: Mild cardiomegaly, unchanged. Aortic atherosclerosis.  Central pulmonary vascular congestion has increased from prior examination. Gradient opacity at the right greater than left lung bases likely reflecting layering pleural effusions. There are superimposed interstitial and ill-defined airspace opacities with a mid to lower lung predominance likely reflecting edema. No evidence of pneumothorax. Thoracic spondylosis. No acute bony abnormality. Overlying cardiac monitoring leads. In opacity projects over the medial right lung apex and superior mediastinum likely reflecting a skin fold. IMPRESSION: entral pulmonary vascular congestion has increased from prior exam 11/24/2019. Also increased from prior exam, there are bilateral interstitial and ill-defined airspace opacities with a mid to lower lung predominance, favored to reflect edema. Infection cannot be definitively excluded. Layering bilateral pleural effusions. Mild cardiomegaly with aortic atherosclerosis. Electronically Signed   By: Kellie Simmering DO   On: 12/28/2019 16:34    EKG: Independently reviewed and interpreted showed a sinus rhythm of 95 the prolonged PR interval and LVH and a QTC of 440  on my interpretation  Assessment/Plan Active Problems:   Aortic stenosis with mitral and aortic insufficiency   CKD (chronic kidney disease), stage III (HCC)   Hyperlipidemia   Hypothyroid   History of prostate cancer   AKI (acute kidney injury) ([place])   Severe aortic stenosis   Weakness generalized   Presbycusis of both ears   Protein-calorie malnutrition, severe   Dyspnea on exertion   Dyspnea on exertion and acute respiratory failure with hypoxia -Admit to inpatient telemetry -Possibly secondary to a pneumonia present on admission however his Covid testing is still pending and he does have severe aortic stenosis this could be attributing; because the Covid testing is still pending we will obtain inflammatory markers as well as a D-dimer **Addendum: The Covid point-of-care testing was negative -CXR  showed "Central pulmonary vascular congestion has increased from prior exam 11/24/2019. Also increased from prior exam, there are bilateral interstitial and ill-defined airspace opacities with a mid to lower lung predominance, favored to reflect edema. Infection cannot be definitively excluded. Layering bilateral pleural effusions. Mild cardiomegaly with aortic atherosclerosis." -Clinically appeared a little dry on examination so he was given 1 L normal saline bolus in the ED and will place on maintenance IV fluids at 75 mL/h for now for 12 hours -On exam he did appear to have some crackles so we will try not to over hydrate -Recently was hospitalized for myasthenia gravis and will continue with medications -Check NIF and his forced vital capacity every 8 hours -Obtain a chest CT scan without contrast given his renal function -Start the patient on Combivent -Check Troponin x2; Initial Troponin was 237 -Patient had a leukocytosis on admission and will continue to monitor WBC count as it was 14.8 and will need to monitor response -Also given guaifenesin, flutter valve and incentive spirometry -We will consult Cardiology to weigh in given his severe aortic stenosis -Will not repeat echocardiogram as recent echo was just done earlier this month -Started the patient on IV ceftriaxone for his suspected pneumonia and also had added azithromycin but will stop azithromycin as it can worsen myasthenia gravis -Currently he is requiring 2 L of supplemental oxygen via nasal cannula to maintain O2 saturations and will need to continue to wean oxygen as tolerated and -Check SLP just to make sure is not aspirating given his myasthenia gravis -Repeat chest x-ray in the a.m. follow-up on CT scan and adjust therapies as deemed fit\  **ADDENDUM 1910: CT reviewed and showed suspected mild interstitial edema and moderate bilateral pleural effusions associated with mild lower lobe compressive atelectasis no suspicious  findings for pneumonia.  Will stop his fluid hydration for now and order a dose of Lasix and also order a thoracentesis to be done.  We will discuss with cardiology as he does have a history of chronic grade 2 diastolic dysfunction and severe aortic stenosis so this could be secondary to this given that he had no lower extremity edema noted  Generalized Weakness and poor Po Intake -As above -Consult nutritionist for further evaluation recommendations -Check TSH, urinalysis, urine culture -Check blood cultures x2 -Actiq acidosis was 2.3 in the setting of poor p.o. intake and dehydration -Continue with IV fluid hydration with normal saline at 75 MLS per hour we will try and avoid overhydration -Clinically appears dry on examination  Lactic Acidosis -It was slightly slightly elevated 2.2 on admission -Continue with IV fluid hydration as above  -Continue monitor and trend lactic acid level  AKI on CKD stage IIIb Metabolic  acidosis -Was recently discharged with a baseline creatinine of 1.7 -BUN/creatinine on admission was 91/3.6 likely in the setting of poor p.o. intake and mild dehydration -IV fluid hydration as above -Check Urine Lytes and Renal U/S -avoid nephrotoxic medications, contrast dyes, hypotension and renally adjust medications -Continue monitor and trend renal function -Repeat CMP in a.m.  Protein calorie malnutrition which is severe -Nutritionist consulted -Continue with supplementations of Ensure Enlive 237 mils p.o. 3 times daily  Myasthenia Gravis -Recently admitted and had -Continue to monitor NIF and forced vital capacity every 8 hours -Recently had plasmapheresis -Continue with a dysphagia 3 diet Continue mycophenolate 250 mg p.o. twice daily as well as pyridostigmine 30 mg p.o. 4 times daily and prednisone 20 mg p.o. daily -If continues to worsen will need formal neurology consultation  Hypothyroidism -Check TSH  -Continue with levothyroxine 50 mcg p.o.  daily  Hyperlipidemia -Continue Rosuvastatin 20 mg p.o. daily  Aortic stenosis Chronic diastolic grade 2 dysfunction -Severe and will consult cardiology for further evaluation recommendations as this may be causing his dyspnea -Recent ECHO showed Left Ventricle: Left ventricular ejection fraction, by visual estimation,  is 55 to 60%. The left ventricle has normal function. The left ventricle has no regional wall motion abnormalities. There is mildly increased left  ventricular hypertrophy. Left  ventricular diastolic parameters are consistent with Grade II diastolic  dysfunction (pseudonormalization). Elevated left ventricular end-diastolic  Pressure.   Aortic Valve: The aortic valve is abnormal. Aortic valve regurgitation is  mild to moderate. Aortic regurgitation PHT measures 339 msec. Severe  aortic stenosis is present. There is severe calcifcation of the aortic  valve. Aortic valve mean gradient  measures 60.0 mmHg. Aortic valve peak gradient measures 94.6 mmHg. Aortic  valve area, by VTI measures 0.47 cm -Appreciate cardiology evaluation and further recommendations  Mildly Elevated Troponin -In the setting of Above -Will have Cardiology Evaluate -Patient not complaining of Chest Pain but has a Dry hacking cough per Family   Leukocytosis -Mild at 14.8 and could be in the setting of infection but also patient is on prednisone 20 mg p.o. daily could be combination of steroid demargination  -Continue monitor for signs and symptoms of infection -Checking Legionella and strep and urine antigens -Continue to monitor temperature curve and repeat CBC in a.m.  Thrombocytopenia -Patient's platelet count is somewhat worse and when he got discharged on 12/13/2019 -Patient's platelet count went from 139 and is now 29 -Continue to monitor for signs and symptoms of bleeding; currently no overt bleeding noted -Repeat CBC in a.m.  DVT prophylaxis: Enoxaparin 40 mg subcu every 24 Code Status:  FULL CODE Family Communication: Discussed with Grandson at Bedside  Disposition Plan: She is from an independent living facility and he has been having poor p.o. intake and now appears to be in pneumonia.  He will need PT OT to evaluate for further skilled needs prior to safe discharge disposition Consults called: Cardiology; Discussed with Neurology  Admission status: Inpatient Telemetry   Severity of Illness: The appropriate patient status for this patient is INPATIENT. Inpatient status is judged to be reasonable and necessary in order to provide the required intensity of service to ensure the patient's safety. The patient's presenting symptoms, physical exam findings, and initial radiographic and laboratory data in the context of their chronic comorbidities is felt to place them at high risk for further clinical deterioration. Furthermore, it is not anticipated that the patient will be medically stable for discharge from the hospital within 2 midnights of  admission. The following factors support the patient status of inpatient.   " The patient's presenting symptoms include generalized weakness and dyspnea. " The worrisome physical exam findings include slightly poor skin turgor and diminished breath sounds with crackles " The initial radiographic and laboratory data are worrisome because of possible pneumonia with pleural effusions. " The chronic co-morbidities are listed as above.   * I certify that at the point of admission it is my clinical judgment that the patient will require inpatient hospital care spanning beyond 2 midnights from the point of admission due to high intensity of service, high risk for further deterioration and high frequency of surveillance required.Kerney Elbe, D.O. [facility] is on [place]  If 7PM-7AM, please contact night-coverage [email or web address]  12/23/2019, 7:07 PM

## 2019-12-21 NOTE — ED Provider Notes (Signed)
[facility] EMERGENCY DEPARTMENT Provider Note   CSN: [id number] Arrival date & time: 12/25/2019  1250     History Chief Complaint  Patient presents with  . Weakness    Jason Riley is a 86 y.o. male.  86 yo M with chief complaints of shortness of breath on exertion.  This been ongoing for the past week.  Patient has had decreased oral intake and has had a couple episodes where he has hallucinated.  No overt fevers having a dry cough.  No vomiting or diarrhea no abdominal tenderness no chest pain or pressure.  Patient was just in the hospital for an exacerbation of his myasthenia gravis.  He finished a course of plasmapheresis and then was discharged back to his assisted living facility.  The patient lives in independent living and was not eating and drinking for the past week.  Becoming more weak they called their neurologist today who suggested they come to the ED for evaluation.  Patient is hard of hearing and is a bit slow to respond, at his baseline per family  The history is provided by the patient.  Weakness Severity:  Moderate Onset quality:  Gradual Duration:  1 week Timing:  Constant Progression:  Worsening Chronicity:  New Relieved by:  Nothing Worsened by:  Nothing Ineffective treatments:  None tried Associated symptoms: cough and shortness of breath   Associated symptoms: no abdominal pain, no arthralgias, no chest pain, no diarrhea, no fever, no headaches, no myalgias and no vomiting        Past Medical History:  Diagnosis Date  . CN (constipation)   . Elevated transaminase level   . History of measles   . Hypertension   . Hypothyroidism   . Myasthenia gravis ([place])   . Myasthenia gravis ([place])   . Prostate cancer [facility])    Prostatectomy 2001  . Severe aortic stenosis     Patient Active Problem List   Diagnosis Date Noted  . Dyspnea on exertion 01/14/2020  . Protein-calorie malnutrition, severe 11/30/2019  . Severe aortic stenosis  11/27/2019  . Weakness generalized 11/27/2019  . Presbycusis of both ears 11/27/2019  . Esophageal dysphagia   . Myasthenia gravis with acute exacerbation ([place]) 05/02/2018  . AKI (acute kidney injury) ([place]) 05/02/2018  . Back pain 11/03/2015  . Carotid bruit 11/03/2015  . History of measles 11/03/2015  . CKD (chronic kidney disease), stage III ([place]) 11/03/2015  . Coronary artery disease involving native heart 11/03/2015  . DOE (dyspnea on exertion) 11/03/2015  . Hyperlipidemia 11/03/2015  . Hypothyroid 11/03/2015  . Kidney stone 11/03/2015  . History of prostate cancer 11/03/2015  . Right inguinal pain 11/03/2015  . Aortic stenosis with mitral and aortic insufficiency 08/15/2012  . Benign essential HTN 08/15/2012    Past Surgical History:  Procedure Laterality Date  . APPENDECTOMY  1955  . HERNIA REPAIR Right 2006   Dr. Jamal Collin  . IR FLUORO GUIDE CV LINE LEFT  05/03/2018  . IR FLUORO GUIDE CV LINE RIGHT  11/25/2019  . IR REMOVAL TUN CV CATH W/O FL  12/04/2019  . IR US GUIDE VASC ACCESS RIGHT  05/03/2018  . IR US GUIDE VASC ACCESS RIGHT  11/25/2019  . PROSTATECTOMY  2001   Dr. Yves Dill       Family History  Problem Relation Age of Onset  . Heart disease Mother   . Colon cancer Father   . Obesity Sister     Social History   Tobacco Use  .  Smoking status: Former Smoker    Years: 15.00    Types: Cigars  . Smokeless tobacco: Current User    Types: Chew  . Tobacco comment: quit over 30 years ago  Substance Use Topics  . Alcohol use: No    Alcohol/week: 0.0 standard drinks  . Drug use: No    Home Medications Prior to Admission medications   Medication Sig Start Date End Date Taking? Authorizing Provider  aspirin (ASPIRIN LOW DOSE) 81 MG EC tablet Take 81 mg by mouth daily.    Yes [provider]  feeding supplement, ENSURE ENLIVE, (ENSURE ENLIVE) LIQD Take 237 mLs by mouth 3 (three) times daily between meals. 12/04/19  Yes Regalado, Belkys A, MD    Glycerin-Polysorbate 80 (REFRESH DRY EYE THERAPY OP) Place 1 drop into both eyes as needed (for dryness or irritation).   Yes [provider]  levothyroxine (SYNTHROID) 50 MCG tablet Take 1 tablet (50 mcg total) by mouth daily. 11/16/19  Yes Mar Daring, PA-C  Multiple Vitamin (MULTIVITAMIN WITH MINERALS) TABS tablet Take 1 tablet by mouth daily. 12/05/19  Yes Regalado, Belkys A, MD  mycophenolate (CELLCEPT) 250 MG capsule Take 1 capsule (250 mg total) by mouth 2 (two) times daily. Patient taking differently: Take 250-500 mg by mouth See admin instructions. Take 250 mg by mouth in the morning and 500 mg at bedtime 12/04/19  Yes Regalado, Belkys A, MD  predniSONE (DELTASONE) 20 MG tablet Take 1 tablet (20 mg total) by mouth daily with breakfast. 12/05/19  Yes Regalado, Belkys A, MD  pyridostigmine (MESTINON) 60 MG tablet Take 0.5 tablets (30 mg total) by mouth 4 (four) times daily. 12/04/19  Yes Regalado, Belkys A, MD  rosuvastatin (CRESTOR) 20 MG tablet Take 1 tablet (20 mg total) by mouth daily. 11/16/19  Yes Mar Daring, PA-C    Allergies    Atorvastatin, Other, and Sulfa antibiotics  Review of Systems   Review of Systems  Constitutional: Negative for chills and fever.  HENT: Negative for congestion and facial swelling.   Eyes: Negative for discharge and visual disturbance.  Respiratory: Positive for cough and shortness of breath.   Cardiovascular: Negative for chest pain and palpitations.  Gastrointestinal: Negative for abdominal pain, diarrhea and vomiting.  Musculoskeletal: Negative for arthralgias and myalgias.  Skin: Negative for color change and rash.  Neurological: Positive for weakness. Negative for tremors, syncope and headaches.  Psychiatric/Behavioral: Negative for confusion and dysphoric mood.    Physical Exam Updated Vital Signs BP 123/66 (BP Location: Right Arm)   Pulse 91   Temp 98 F (36.7 C) (Oral)   Resp (!) 22   Ht 5\' 8"  (1.727 m)   Wt  53.5 kg   SpO2 99%   BMI 17.94 kg/m   Physical Exam Vitals and nursing note reviewed.  Constitutional:      Appearance: He is well-developed.     Comments: Cachectic  HENT:     Head: Normocephalic and atraumatic.  Eyes:     Pupils: Pupils are equal, round, and reactive to light.  Neck:     Vascular: No JVD.  Cardiovascular:     Rate and Rhythm: Normal rate and regular rhythm.     Heart sounds: No murmur. No friction rub. No gallop.   Pulmonary:     Effort: No respiratory distress.     Breath sounds: Rhonchi present. No wheezing.     Comments: Tachypnea and diffuse rhonchi Abdominal:     General: There is no distension.  Tenderness: There is no abdominal tenderness. There is no guarding or rebound.  Musculoskeletal:        General: Normal range of motion.     Cervical back: Normal range of motion and neck supple.  Skin:    Coloration: Skin is not pale.     Findings: No rash.  Neurological:     Mental Status: He is alert and oriented to person, place, and time.     Comments: 5 out of 5 muscle strength of bilateral upper extremities.  Psychiatric:        Behavior: Behavior normal.     ED Results / Procedures / Treatments   Labs (all labs ordered are listed, but only abnormal results are displayed) Labs Reviewed  BASIC METABOLIC PANEL - Abnormal; Notable for the following components:      Result Value   CO2 21 (*)    Glucose, Bld 119 (*)    BUN 91 (*)    Creatinine, Ser 3.16 (*)    GFR calc non Af Amer 17 (*)    GFR calc Af Amer 19 (*)    All other components within normal limits  CBC - Abnormal; Notable for the following components:   WBC 14.8 (*)    RDW 17.7 (*)    Platelets 83 (*)    All other components within normal limits  URINALYSIS, ROUTINE W REFLEX MICROSCOPIC - Abnormal; Notable for the following components:   APPearance HAZY (*)    Hgb urine dipstick SMALL (*)    Protein, ur 100 (*)    All other components within normal limits  LACTIC ACID,  PLASMA - Abnormal; Notable for the following components:   Lactic Acid, Venous 2.3 (*)    All other components within normal limits  BRAIN NATRIURETIC PEPTIDE - Abnormal; Notable for the following components:   B Natriuretic Peptide >4,500.0 (*)    All other components within normal limits  LACTATE DEHYDROGENASE - Abnormal; Notable for the following components:   LDH 292 (*)    All other components within normal limits  C-REACTIVE PROTEIN - Abnormal; Notable for the following components:   CRP 1.2 (*)    All other components within normal limits  CBC - Abnormal; Notable for the following components:   WBC 12.3 (*)    RDW 17.5 (*)    Platelets 73 (*)    All other components within normal limits  CREATININE, SERUM - Abnormal; Notable for the following components:   Creatinine, Ser 3.06 (*)    GFR calc non Af Amer 17 (*)    GFR calc Af Amer 20 (*)    All other components within normal limits  CBG MONITORING, ED - Abnormal; Notable for the following components:   Glucose-Capillary 101 (*)    All other components within normal limits  TROPONIN I (HIGH SENSITIVITY) - Abnormal; Notable for the following components:   Troponin I (High Sensitivity) 237 (*)    All other components within normal limits  TROPONIN I (HIGH SENSITIVITY) - Abnormal; Notable for the following components:   Troponin I (High Sensitivity) 255 (*)    All other components within normal limits  CULTURE, BLOOD (ROUTINE X 2)  CULTURE, BLOOD (ROUTINE X 2)  SARS CORONAVIRUS 2 (TAT 6-24 HRS)  URINE CULTURE  EXPECTORATED SPUTUM ASSESSMENT W REFEX TO RESP CULTURE  LACTIC ACID, PLASMA  FERRITIN  CREATININE, URINE, RANDOM  SODIUM, URINE, RANDOM  OSMOLALITY, URINE  SEDIMENTATION RATE  TSH  FIBRINOGEN  D-DIMER, QUANTITATIVE (NOT AT  [facility])  HIV ANTIBODY (ROUTINE TESTING W REFLEX)  COMPREHENSIVE METABOLIC PANEL  CBC WITH DIFFERENTIAL/PLATELET  URINALYSIS, COMPLETE (UACMP) WITH MICROSCOPIC  LEGIONELLA PNEUMOPHILA SEROGP 1  UR AG  STREP PNEUMONIAE URINARY ANTIGEN  INFLUENZA PANEL BY PCR (TYPE A & B)  POC SARS CORONAVIRUS 2 AG -  ED    EKG EKG Interpretation  Date/Time:  Monday December 21 2019 16:09:14 EST Ventricular Rate:  95 PR Interval:    QRS Duration: 107 QT Interval:  350 QTC Calculation: 440 R Axis:   47 Text Interpretation: Sinus rhythm Prolonged PR interval LVH with secondary repolarization abnormality Anterior Q waves, possibly due to LVH Baseline wander in lead(s) 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 seen on prior but more pronounced Otherwise no significant change Confirmed by Deno Etienne [phone]) on 01/20/2020 4:19:30 PM   Radiology CT CHEST WO CONTRAST  Result Date: 01/05/2020 CLINICAL DATA:  Pneumonia.  Myasthenia gravis. EXAM: CT CHEST WITHOUT CONTRAST TECHNIQUE: Multidetector CT imaging of the chest was performed following the standard protocol without IV contrast. COMPARISON:  Chest radiograph dated 01/06/2020. CT chest dated 04/16/2018 FINDINGS: Cardiovascular: Heart is normal in size.  No pericardial effusion. No evidence of thoracic aortic aneurysm. Dense calcifications of the aortic root/valve. Atherosclerotic calcifications of the aortic arch. Coronary atherosclerosis of the LAD and left circumflex. Mediastinum/Nodes: No suspicious medial lymphadenopathy. Visualized thyroid is unremarkable. Lungs/Pleura: Mild scattered ground-glass opacities in the lungs bilaterally, including in the bilateral suprahilar/perihilar regions, favoring mild interstitial edema over infection. Moderate bilateral pleural effusions. Associated mild lower lobe compressive atelectasis. Scattered peribronchovascular hyperdensities in the right middle and lower lobes favors sequela of prior aspiration mild biapical pleural-parenchymal scarring. No suspicious pulmonary nodules. No pneumothorax. Upper Abdomen: Visualized upper abdomen is notable for vascular calcifications, a 2.3 cm left upper pole renal cyst, and a 4 mm  nonobstructing left renal calculus (series 3/image 168). Musculoskeletal: Degenerative changes of the visualized thoracolumbar spine. IMPRESSION: Suspected mild interstitial edema. Moderate bilateral pleural effusions. Associated mild lower lobe compressive atelectasis. No findings suspicious for pneumonia. Dense calcifications of the aortic root/valve in this patient with known aortic stenosis. Aortic Atherosclerosis (ICD10-I70.0). Electronically Signed   By: Julian Hy M.D.   On: 12/23/2019 17:51   DG Chest Portable 1 View  Result Date: 01/12/2020 CLINICAL DATA:  Shortness of breath. EXAM: PORTABLE CHEST 1 VIEW COMPARISON:  Chest radiograph 11/24/2019 FINDINGS: Mild cardiomegaly, unchanged. Aortic atherosclerosis. Central pulmonary vascular congestion has increased from prior examination. Gradient opacity at the right greater than left lung bases likely reflecting layering pleural effusions. There are superimposed interstitial and ill-defined airspace opacities with a mid to lower lung predominance likely reflecting edema. No evidence of pneumothorax. Thoracic spondylosis. No acute bony abnormality. Overlying cardiac monitoring leads. In opacity projects over the medial right lung apex and superior mediastinum likely reflecting a skin fold. IMPRESSION: entral pulmonary vascular congestion has increased from prior exam 11/24/2019. Also increased from prior exam, there are bilateral interstitial and ill-defined airspace opacities with a mid to lower lung predominance, favored to reflect edema. Infection cannot be definitively excluded. Layering bilateral pleural effusions. Mild cardiomegaly with aortic atherosclerosis. Electronically Signed   By: Kellie Simmering DO   On: 01/08/2020 16:34    Procedures Procedures (including critical care time)  Medications Ordered in ED Medications  aspirin EC tablet 81 mg (has no administration in time range)  rosuvastatin (CRESTOR) tablet 20 mg (has no  administration in time range)  levothyroxine (SYNTHROID) tablet 50 mcg (has no administration in time  range)  predniSONE (DELTASONE) tablet 20 mg (has no administration in time range)  mycophenolate (CELLCEPT) capsule 250 mg (250 mg Oral Given 12/23/2019 2235)  pyridostigmine (MESTINON) tablet 30 mg (30 mg Oral Given 12/27/2019 2235)  feeding supplement (ENSURE ENLIVE) (ENSURE ENLIVE) liquid 237 mL (has no administration in time range)  multivitamin with minerals tablet 1 tablet (has no administration in time range)  enoxaparin (LOVENOX) injection 30 mg (30 mg Subcutaneous Given 12/26/2019 2235)  acetaminophen (TYLENOL) tablet 650 mg (has no administration in time range)    Or  acetaminophen (TYLENOL) suppository 650 mg (has no administration in time range)  senna-docusate (Senokot-S) tablet 1 tablet (has no administration in time range)  bisacodyl (DULCOLAX) suppository 10 mg (has no administration in time range)  ondansetron (ZOFRAN) tablet 4 mg (has no administration in time range)    Or  ondansetron (ZOFRAN) injection 4 mg (has no administration in time range)  guaiFENesin (MUCINEX) 12 hr tablet 600 mg (600 mg Oral Given 12/26/2019 2236)  cefTRIAXone (ROCEPHIN) 2 g in sodium chloride 0.9 % 100 mL IVPB (has no administration in time range)  ipratropium-albuterol (DUONEB) 0.5-2.5 (3) MG/3ML nebulizer solution 3 mL (has no administration in time range)  sodium chloride flush (NS) 0.9 % injection 3 mL (3 mLs Intravenous Given 12/22/2019 1823)  sodium chloride 0.9 % bolus 1,000 mL (0 mLs Intravenous Stopped 01/10/2020 1810)  cefTRIAXone (ROCEPHIN) 2 g in sodium chloride 0.9 % 100 mL IVPB (2 g Intravenous New Bag/Given 12/25/2019 1821)  furosemide (LASIX) injection 40 mg (40 mg Intravenous Given 01/13/2020 1927)    ED Course  I have reviewed the triage vital signs and the nursing notes.  Pertinent labs & imaging results that were available during my care of the patient were reviewed by me and considered in my medical  decision making (see chart for details).    MDM Rules/Calculators/A&P                      86 yo M with a cc of fatigue.  Going on for past week.  Patient was recently in the hospital with the myasthenia exacerbation.  He thinks this does not feel the same.  On exam the patient is mildly tachypneic.  Diffuse rhonchi.  Will obtain a chest x-ray.  Troponin.  Chest x-ray viewed by me with bilateral lower infiltrates worse on the right than the left.  Read by radiology is favoring edema though patient is clinically very dry, much more likely infection with a cough and shortness of breath on exertion.  Will start on antibiotics.  Send off Covid testing.  Discussed with hospitalist for admission.  The patients results and plan were reviewed and discussed.   Any x-rays performed were independently reviewed by myself.   Differential diagnosis were considered with the presenting HPI.  Medications  aspirin EC tablet 81 mg (has no administration in time range)  rosuvastatin (CRESTOR) tablet 20 mg (has no administration in time range)  levothyroxine (SYNTHROID) tablet 50 mcg (has no administration in time range)  predniSONE (DELTASONE) tablet 20 mg (has no administration in time range)  mycophenolate (CELLCEPT) capsule 250 mg (250 mg Oral Given 01/17/2020 2235)  pyridostigmine (MESTINON) tablet 30 mg (30 mg Oral Given 01/08/2020 2235)  feeding supplement (ENSURE ENLIVE) (ENSURE ENLIVE) liquid 237 mL (has no administration in time range)  multivitamin with minerals tablet 1 tablet (has no administration in time range)  enoxaparin (LOVENOX) injection 30 mg (30 mg Subcutaneous Given 12/26/2019 2235)  acetaminophen (TYLENOL) tablet 650 mg (has no administration in time range)    Or  acetaminophen (TYLENOL) suppository 650 mg (has no administration in time range)  senna-docusate (Senokot-S) tablet 1 tablet (has no administration in time range)  bisacodyl (DULCOLAX) suppository 10 mg (has no administration in time  range)  ondansetron (ZOFRAN) tablet 4 mg (has no administration in time range)    Or  ondansetron (ZOFRAN) injection 4 mg (has no administration in time range)  guaiFENesin (MUCINEX) 12 hr tablet 600 mg (600 mg Oral Given 01/20/2020 2236)  cefTRIAXone (ROCEPHIN) 2 g in sodium chloride 0.9 % 100 mL IVPB (has no administration in time range)  ipratropium-albuterol (DUONEB) 0.5-2.5 (3) MG/3ML nebulizer solution 3 mL (has no administration in time range)  sodium chloride flush (NS) 0.9 % injection 3 mL (3 mLs Intravenous Given 01/16/2020 1823)  sodium chloride 0.9 % bolus 1,000 mL (0 mLs Intravenous Stopped 01/14/2020 1810)  cefTRIAXone (ROCEPHIN) 2 g in sodium chloride 0.9 % 100 mL IVPB (2 g Intravenous New Bag/Given 12/28/2019 1821)  furosemide (LASIX) injection 40 mg (40 mg Intravenous Given 12/27/2019 1927)    Vitals:   01/06/2020 1803 12/27/2019 1900 01/04/2020 1930 01/15/2020 2112  BP: 138/63 130/64 (!) 141/65 123/66  Pulse: 89 90 90 91  Resp: 15 19 (!) 24 (!) 22  Temp:      TempSrc:      SpO2: 100% 100% 99% 99%  Weight:      Height:        Final diagnoses:  Community acquired pneumonia of right lower lobe of lung    Admission/ observation were discussed with the admitting physician, patient and/or family and they are comfortable with the plan.    Final Clinical Impression(s) / ED Diagnoses Final diagnoses:  Community acquired pneumonia of right lower lobe of lung    Rx / DC Orders ED Discharge Orders    None       Deno Etienne, DO 12/28/2019 2333

## 2019-12-21 NOTE — ED Triage Notes (Signed)
Pt was recently admitted for myasthenia gravis and d/ced to an independent living- per family member pt has not been getting out of his chair and eating very little. Ems was called lasted night but pt refused to come to ED.  Pt agreed to come to ED this morning after family spoke with his pcp.

## 2019-12-21 NOTE — ED Notes (Signed)
Dr. Alfredia Ferguson notified of increased trop via phone, no new orders received. Stated patient has cardiology services consult in place.

## 2019-12-22 ENCOUNTER — Encounter (HOSPITAL_COMMUNITY): Payer: Self-pay | Admitting: Internal Medicine

## 2019-12-22 ENCOUNTER — Inpatient Hospital Stay (HOSPITAL_COMMUNITY): Payer: Medicare Other

## 2019-12-22 DIAGNOSIS — I350 Nonrheumatic aortic (valve) stenosis: Secondary | ICD-10-CM

## 2019-12-22 DIAGNOSIS — L8990 Pressure ulcer of unspecified site, unspecified stage: Secondary | ICD-10-CM | POA: Insufficient documentation

## 2019-12-22 DIAGNOSIS — I5033 Acute on chronic diastolic (congestive) heart failure: Secondary | ICD-10-CM

## 2019-12-22 DIAGNOSIS — E43 Unspecified severe protein-calorie malnutrition: Secondary | ICD-10-CM

## 2019-12-22 LAB — URINALYSIS, COMPLETE (UACMP) WITH MICROSCOPIC
Bilirubin Urine: NEGATIVE
Glucose, UA: NEGATIVE mg/dL
Ketones, ur: NEGATIVE mg/dL
Leukocytes,Ua: NEGATIVE
Nitrite: NEGATIVE
Protein, ur: NEGATIVE mg/dL
Specific Gravity, Urine: 1.006 (ref 1.005–1.030)
pH: 5 (ref 5.0–8.0)

## 2019-12-22 LAB — CBC WITH DIFFERENTIAL/PLATELET
Abs Immature Granulocytes: 0.06 10*3/uL (ref 0.00–0.07)
Basophils Absolute: 0 10*3/uL (ref 0.0–0.1)
Basophils Relative: 0 %
Eosinophils Absolute: 0 10*3/uL (ref 0.0–0.5)
Eosinophils Relative: 0 %
HCT: 41.3 % (ref 39.0–52.0)
Hemoglobin: 13.4 g/dL (ref 13.0–17.0)
Immature Granulocytes: 1 %
Lymphocytes Relative: 6 %
Lymphs Abs: 0.7 10*3/uL (ref 0.7–4.0)
MCH: 31.9 pg (ref 26.0–34.0)
MCHC: 32.4 g/dL (ref 30.0–36.0)
MCV: 98.3 fL (ref 80.0–100.0)
Monocytes Absolute: 0.6 10*3/uL (ref 0.1–1.0)
Monocytes Relative: 5 %
Neutro Abs: 10.5 10*3/uL — ABNORMAL HIGH (ref 1.7–7.7)
Neutrophils Relative %: 88 %
Platelets: 72 10*3/uL — ABNORMAL LOW (ref 150–400)
RBC: 4.2 MIL/uL — ABNORMAL LOW (ref 4.22–5.81)
RDW: 17.4 % — ABNORMAL HIGH (ref 11.5–15.5)
WBC: 11.8 10*3/uL — ABNORMAL HIGH (ref 4.0–10.5)
nRBC: 0 % (ref 0.0–0.2)

## 2019-12-22 LAB — COMPREHENSIVE METABOLIC PANEL
ALT: 60 U/L — ABNORMAL HIGH (ref 0–44)
AST: 29 U/L (ref 15–41)
Albumin: 3.4 g/dL — ABNORMAL LOW (ref 3.5–5.0)
Alkaline Phosphatase: 117 U/L (ref 38–126)
Anion gap: 15 (ref 5–15)
BUN: 91 mg/dL — ABNORMAL HIGH (ref 8–23)
CO2: 21 mmol/L — ABNORMAL LOW (ref 22–32)
Calcium: 9.6 mg/dL (ref 8.9–10.3)
Chloride: 110 mmol/L (ref 98–111)
Creatinine, Ser: 3.06 mg/dL — ABNORMAL HIGH (ref 0.61–1.24)
GFR calc Af Amer: 20 mL/min — ABNORMAL LOW (ref >60)
GFR calc non Af Amer: 17 mL/min — ABNORMAL LOW (ref >60)
Glucose, Bld: 97 mg/dL (ref 70–99)
Potassium: 4.5 mmol/L (ref 3.5–5.1)
Sodium: 146 mmol/L — ABNORMAL HIGH (ref 135–145)
Total Bilirubin: 1 mg/dL (ref 0.3–1.2)
Total Protein: 5.3 g/dL — ABNORMAL LOW (ref 6.5–8.1)

## 2019-12-22 LAB — D-DIMER, QUANTITATIVE
D-Dimer, Quant: 7.62 ug/mL-FEU — ABNORMAL HIGH (ref 0.00–0.50)
D-Dimer, Quant: 8.49 ug/mL-FEU — ABNORMAL HIGH (ref 0.00–0.50)

## 2019-12-22 LAB — STREP PNEUMONIAE URINARY ANTIGEN: Strep Pneumo Urinary Antigen: NEGATIVE

## 2019-12-22 MED ORDER — LIDOCAINE HCL 1 % IJ SOLN
INTRAMUSCULAR | Status: AC
  Filled 2019-12-22: qty 20

## 2019-12-22 MED ORDER — MYCOPHENOLATE MOFETIL 250 MG PO CAPS
500.0000 mg | ORAL_CAPSULE | Freq: Every day | ORAL | Status: DC
  Administered 2019-12-22: 500 mg via ORAL
  Filled 2019-12-22 (×2): qty 2

## 2019-12-22 MED ORDER — JUVEN PO PACK
1.0000 | PACK | Freq: Two times a day (BID) | ORAL | Status: DC
  Administered 2019-12-22 – 2019-12-23 (×2): 1 via ORAL
  Filled 2019-12-22 (×2): qty 1

## 2019-12-22 MED ORDER — MYCOPHENOLATE MOFETIL 250 MG PO CAPS
250.0000 mg | ORAL_CAPSULE | Freq: Every morning | ORAL | Status: DC
  Administered 2019-12-23: 250 mg via ORAL
  Filled 2019-12-22: qty 1

## 2019-12-22 MED ORDER — POLYVINYL ALCOHOL 1.4 % OP SOLN
1.0000 [drp] | OPHTHALMIC | Status: DC | PRN
  Filled 2019-12-22: qty 15

## 2019-12-22 MED ORDER — FUROSEMIDE 10 MG/ML IJ SOLN
40.0000 mg | Freq: Once | INTRAMUSCULAR | Status: AC
  Administered 2019-12-22: 40 mg via INTRAVENOUS
  Filled 2019-12-22: qty 4

## 2019-12-22 NOTE — Progress Notes (Signed)
Initial Nutrition Assessment  DOCUMENTATION CODES:   Underweight(Suspected ongoing severe PCM)  INTERVENTION:  -Continue Ensure Enlive po BID, each supplement provides 350 kcal and 20 grams of protein  -Continue MVI  -Magic cup TID with meals, each supplement provides 290 kcal and 9 grams of protein  - Juven BID, each packet provides 95 calories, 2.5 grams of protein (collagen), and 9.8 grams of carbohydrate (3 grams sugar); also contains 7 grams of L-arginine and L-glutamine, 300 mg vitamin C, 15 mg vitamin E, 1.2 mcg vitamin B-12, 9.5 mg zinc, 200 mg calcium, and 1.5 g  Calcium Beta-hydroxy-Beta-methylbutyrate to support wound healing   NUTRITION DIAGNOSIS:   Inadequate oral intake related to chronic illness, wound healing(myasthenia gravis; stage II PI; coccyx) as evidenced by estimated needs, percent weight loss(per chart, family reports progressive decline in po intake).    GOAL:   Patient will meet greater than or equal to 90% of their needs    MONITOR:   PO intake, Weight trends, Supplement acceptance, Labs, Skin  REASON FOR ASSESSMENT:   Consult Assessment of nutrition requirement/status, Poor PO  ASSESSMENT:  RD working remotely.  86 year old male with past medical history of severe aortic stenosis, h/o prostate cancer s/p prostatectomy, myasthenia gravis, HLD, hypothyroidism, CKD stage III, recent hospitalization 2 weeks ago due to myasthenia gravis exacerbation requiring plasmapheresis presented with complaints of generalized weakness, shortness of breath, and poor oral intake over the past week.  Patient admitted for generalized weakness and dyspnea on exertion as well as new onset AKI on CKD stage III.  Unable to obtain nutrition history at this time, per location pt in interventional radiology.  Patient was seen by [facility] RD on 2/4 during previous admission. Per review of notes, patient with moderate/severe fat and muscle depletions on NFPE, endorsed poor oral  intake and ~30 lbs unintentional wt loss over the past 3 months. Patient diagnosed with severe malnutrition at that time. Likely ongoing, however unable to identify at this time without updated NFPE and/or dietary recall.   SLP evaluation completed today, recommended dysphagia III mechanical soft with thin liquids. No documented meal intakes for review, will continue to monitor. RD to provide Ensure and Magic Cup supplements to aid with calorie/protien needs. Patient noted to enjoy these supplements during prior admission.  Noted stage II pressure injury to coccyx. Will provide Juven to support wound healing.  Current wt 117.7 lbs Per history, weights trending down over the past year, patient noted to have lost ~15 lbs (11%)  in the past month which is severe for time frame. On 2/12 pt wt 60.1 kg (132.22 lbs), on 1/25 pt wt 60.4 kg 132.88 lbs)  I/Os: -780 ml since admit UOP: 900 ml since admit  Medications reviewed and include: Prednisone, MVI, Mestinon, Cellcept  Labs: CBG 101, Na 146 (H), Cr 3.06 (H), BUN 91 (H), WBC 11.8 (H)   NUTRITION - FOCUSED PHYSICAL EXAM: Unable to complete at this time, RD working remotely.  Diet Order:   Diet Order            DIET DYS 3 Room service appropriate? Yes; Fluid consistency: Thin  Diet effective now              EDUCATION NEEDS:   Not appropriate for education at this time  Skin:  Skin Assessment: Skin Integrity Issues: Skin Integrity Issues:: Stage II Stage II: coccyx  Last BM:  3/1  Height:   Ht Readings from Last 1 Encounters:  01/20/2020 5\' 8"  (  1.727 m)    Weight:   Wt Readings from Last 1 Encounters:  01/12/2020 53.5 kg    Ideal Body Weight:     BMI:  Body mass index is 17.94 kg/m.  Estimated Nutritional Needs:   Kcal:  1700-1900  Protein:  85-100  Fluid:  >/= 1.4 L/day  Lajuan Lines, RD, LDN Clinical Nutrition After Hours/Weekend Pager # [phone]

## 2019-12-22 NOTE — Progress Notes (Signed)
Patient wearing oxygen set at 2lpm with Sp02=96%.  NIF= -35  VC=1.15.

## 2019-12-22 NOTE — Plan of Care (Signed)
  Problem: Health Behavior/Discharge Planning: Goal: Ability to manage health-related needs will improve Outcome: Progressing   

## 2019-12-22 NOTE — Evaluation (Signed)
Clinical/Bedside Swallow Evaluation Patient Details  Name: Jason Riley MRN: [id number] Date of Birth: 1929-02-09  Today's Date: 12/22/2019 Time: SLP Start Time (ACUTE ONLY): [id number] SLP Stop Time (ACUTE ONLY): 0902 SLP Time Calculation (min) (ACUTE ONLY): 20 min  Past Medical History:  Past Medical History:  Diagnosis Date  . CN (constipation)   . Elevated transaminase level   . History of measles   . Hypertension   . Hypothyroidism   . Myasthenia gravis ([place])   . Myasthenia gravis ([place])   . Prostate cancer [facility])    Prostatectomy 2001  . Severe aortic stenosis    Past Surgical History:  Past Surgical History:  Procedure Laterality Date  . APPENDECTOMY  1955  . HERNIA REPAIR Right 2006   Dr. Jamal Collin  . IR FLUORO GUIDE CV LINE LEFT  05/03/2018  . IR FLUORO GUIDE CV LINE RIGHT  11/25/2019  . IR REMOVAL TUN CV CATH W/O FL  12/04/2019  . IR US GUIDE VASC ACCESS RIGHT  05/03/2018  . IR US GUIDE VASC ACCESS RIGHT  11/25/2019  . PROSTATECTOMY  2001   Dr. Yves Dill   HPI:  Jason Riley is a 86 y.o. male with medical history of myasthenia gravis, severe aortic stenosis, history of prostate cancer status post prostatectomy, hyperlipidemia, hypothyroidism presents with a chief complaint of generalized weakness, dyspnea on exertion and decreased oral intake. Per chart family reports hacking cough. Recently hospitalized 2 weeks ago for a myasthenia gravis exacerbation and had to have plasmapheresis and evaluated by neurology. Chest CT no findings suspicious for pna. MBS 11/26/19 trace laryngeal penetration with consecutive straw sips thin, mild residue and sensate. Rec'd Dys 2/thin.     Assessment / Plan / Recommendation Clinical Impression  Pt has history of oral dysphagia and placed on Dys 2/thin following MBS 11/2019. He is endentulous and has difficulty masticating meats. Today there was prolonged mastication and need for liquid wash with soft breakfast potatoes. Despite this pt did not wish to have all  food minced and ground and was agreeable to chopped meats only. Pt was preparing to take 5-6 pills (2 large) with thin with RN and therapist advised and observed pt taking large ones solely with water. He did not have outward difficulty and had him take last 3-4 smaller ones with water again, without overt s/s aspiration. One delayed cough throughout evaluation and he needed very brief break for work of breathing. Recommend Dys 3/thin, pt prefers cups to straws, large pills one at a time with water, smaller pills (2-4 allowed), sit upright and rest breaks. Will follow pt.  SLP Visit Diagnosis: Dysphagia, unspecified (R13.10)    Aspiration Risk  Mild aspiration risk    Diet Recommendation Dysphagia 3 (Mech soft);Thin liquid   Liquid Administration via: Cup Medication Administration: Whole meds with liquid(larger ones do single pills) Supervision: Patient able to self feed;Intermittent supervision to cue for compensatory strategies Compensations: Slow rate;Small sips/bites;Clear throat intermittently Postural Changes: Seated upright at 90 degrees    Other  Recommendations Oral Care Recommendations: Oral care BID   Follow up Recommendations Other (comment)(TBD)      Frequency and Duration min 2x/week  2 weeks       Prognosis Prognosis for Safe Diet Advancement: Good      Swallow Study   General HPI: Jason Riley is a 86 y.o. male with medical history of myasthenia gravis, severe aortic stenosis, history of prostate cancer status post prostatectomy, hyperlipidemia, hypothyroidism presents with a chief complaint of  generalized weakness, dyspnea on exertion and decreased oral intake. Per chart family reports hacking cough. Recently hospitalized 2 weeks ago for a myasthenia gravis exacerbation and had to have plasmapheresis and evaluated by neurology. Chest CT no findings suspicious for pna. MBS 11/26/19 trace laryngeal penetration with consecutive straw sips thin, mild residue and sensate. Rec'd  Dys 2/thin.   Type of Study: Bedside Swallow Evaluation Previous Swallow Assessment: (see HPI MBS 11/2019) Diet Prior to this Study: Regular;Thin liquids Temperature Spikes Noted: No Respiratory Status: Nasal cannula History of Recent Intubation: No Behavior/Cognition: Alert;Cooperative;Pleasant mood;Other (Comment)(HOH) Oral Cavity Assessment: Within Functional Limits Oral Care Completed by SLP: No Oral Cavity - Dentition: Edentulous Vision: Functional for self-feeding Self-Feeding Abilities: Able to feed self Patient Positioning: Upright in bed Baseline Vocal Quality: Normal Volitional Cough: Strong Volitional Swallow: Able to elicit    Oral/Motor/Sensory Function Overall Oral Motor/Sensory Function: Within functional limits   Ice Chips Ice chips: Not tested   Thin Liquid Thin Liquid: Impaired Presentation: Cup;Straw Pharyngeal  Phase Impairments: Cough - Delayed    Nectar Thick Nectar Thick Liquid: Not tested   Honey Thick Honey Thick Liquid: Not tested   Puree Puree: Not tested   Solid     Solid: Impaired Presentation: Self Fed Oral Phase Impairments: Impaired mastication Oral Phase Functional Implications: Prolonged oral transit      Jason Riley 12/22/2019,9:33 AM  Jason Riley Risk analyst [phone] Office [phone]

## 2019-12-22 NOTE — Consult Note (Addendum)
Cardiology Consultation:   Patient ID: Jason Riley MRN: [id number]; DOB: [birth year]  Admit date: 12/29/2019 Date of Consult: 12/22/2019  Primary Care Provider: [name], MD Primary Cardiologist: No primary care provider on file.  Primary Electrophysiologist:  None    Patient Profile:   Jason Riley is a 86 y.o. male with a hx of HTN, hypothyroidism, myasthenia gravis, prostate Ca and severe AS who is being seen today for the evaluation of severe AS at the request of Dr. Alfredia Ferguson.  History of Present Illness:   [name] is an 86 yo male with PMH noted above. He has been followed by Dr. Clayborn Bigness as an outpatient. He was last seen in the office on 05/25/2019 and reported doing reasonably well. Notes indicate he lost his wife in December 2018 and then his son in June 2019. He is currently living at [facility]. He continued to drive. Denied any angina, syncope but did have generalized weakness and shortness of breath at times. Given he was relatively asymptomatic with his AS he was continued with conservative therapy.   He was recently admitted about 2 weeks prior for a myasthenia gravis exacerbation and had to have plasmapheresis. Echo 11/25/19 noted critical AS with gradient of 41mmHg, and G2DD. Brought back to the ED on 3/1 with generalized weakness and dyspnea on exertion for the past week. Per family reports he has had poor oral intake and not as active as he usually is. Also with dry hacking cough and n/v.  In the ED his labs showed stable electrolytes, Cr 3.16, WBC 14.8, BNP 4500, Ddimer 7.62, TSH 2.97. EKG showed SR with prolonged PR, LVH, TWI in inferolateral leads. CXR showed laying bilateral effusions but could not exclude PNA. He was started on IV ceftriaxone, COVID negative. He was initially started on IVFs. CT chest was done showing interstitial edema. IVFs were stopped and given a dose of IV lasix. He is net - 1.4L today.   Heart Pathway Score:     Past Medical History:    Diagnosis Date  . CN (constipation)   . Elevated transaminase level   . History of measles   . Hypertension   . Hypothyroidism   . Myasthenia gravis ([place])   . Myasthenia gravis ([place])   . Prostate cancer [facility])    Prostatectomy 2001  . Severe aortic stenosis     Past Surgical History:  Procedure Laterality Date  . APPENDECTOMY  1955  . HERNIA REPAIR Right 2006   Dr. Jamal Collin  . IR FLUORO GUIDE CV LINE LEFT  05/03/2018  . IR FLUORO GUIDE CV LINE RIGHT  11/25/2019  . IR REMOVAL TUN CV CATH W/O FL  12/04/2019  . IR US GUIDE VASC ACCESS RIGHT  05/03/2018  . IR US GUIDE VASC ACCESS RIGHT  11/25/2019  . PROSTATECTOMY  2001   Dr. Yves Dill     Home Medications:  Prior to Admission medications   Medication Sig Start Date End Date Taking? Authorizing Provider  aspirin (ASPIRIN LOW DOSE) 81 MG EC tablet Take 81 mg by mouth daily.    Yes [provider]  feeding supplement, ENSURE ENLIVE, (ENSURE ENLIVE) LIQD Take 237 mLs by mouth 3 (three) times daily between meals. 12/04/19  Yes Regalado, Belkys A, MD  Glycerin-Polysorbate 80 (REFRESH DRY EYE THERAPY OP) Place 1 drop into both eyes as needed (for dryness or irritation).   Yes [provider]  levothyroxine (SYNTHROID) 50 MCG tablet Take 1 tablet (50 mcg total) by mouth  daily. 11/16/19  Yes Mar Daring, PA-C  Multiple Vitamin (MULTIVITAMIN WITH MINERALS) TABS tablet Take 1 tablet by mouth daily. 12/05/19  Yes Regalado, Belkys A, MD  mycophenolate (CELLCEPT) 250 MG capsule Take 1 capsule (250 mg total) by mouth 2 (two) times daily. Patient taking differently: Take 250-500 mg by mouth See admin instructions. Take 250 mg by mouth in the morning and 500 mg at bedtime 12/04/19  Yes Regalado, Belkys A, MD  predniSONE (DELTASONE) 20 MG tablet Take 1 tablet (20 mg total) by mouth daily with breakfast. 12/05/19  Yes Regalado, Belkys A, MD  pyridostigmine (MESTINON) 60 MG tablet Take 0.5 tablets (30 mg total) by mouth 4 (four) times daily.  12/04/19  Yes Regalado, Belkys A, MD  rosuvastatin (CRESTOR) 20 MG tablet Take 1 tablet (20 mg total) by mouth daily. 11/16/19  Yes Mar Daring, PA-C    Inpatient Medications: Scheduled Meds: . aspirin EC  81 mg Oral Daily  . enoxaparin (LOVENOX) injection  30 mg Subcutaneous Q24H  . feeding supplement (ENSURE ENLIVE)  237 mL Oral TID BM  . guaiFENesin  600 mg Oral BID  . levothyroxine  50 mcg Oral Q0600  . multivitamin with minerals  1 tablet Oral Daily  . [START ON 12/23/2019] mycophenolate  250 mg Oral q morning - 10a  . mycophenolate  500 mg Oral QHS  . predniSONE  20 mg Oral Q breakfast  . pyridostigmine  30 mg Oral QID  . rosuvastatin  20 mg Oral Daily   Continuous Infusions: . cefTRIAXone (ROCEPHIN)  IV     PRN Meds: acetaminophen **OR** acetaminophen, bisacodyl, ipratropium-albuterol, ondansetron **OR** ondansetron (ZOFRAN) IV, senna-docusate  Allergies:    Allergies  Allergen Reactions  . Atorvastatin Other (See Comments)    Elevated liver functions ("liver disorder")  . Other Other (See Comments)    PATIENT HAS NO DENTURES- PREPARED FOOD ACCORDINGLY  . Sulfa Antibiotics Rash    Social History:   Social History   Socioeconomic History  . Marital status: Widowed    Spouse name: Not on file  . Number of children: 1  . Years of education: Some Coll  . Highest education level: Some college, no degree  Occupational History  . Occupation: Retired  Tobacco Use  . Smoking status: Former Smoker    Years: 15.00    Types: Cigars  . Smokeless tobacco: Current User    Types: Chew  . Tobacco comment: quit over 30 years ago  Substance and Sexual Activity  . Alcohol use: No    Alcohol/week: 0.0 standard drinks  . Drug use: No  . Sexual activity: Not Currently  Other Topics Concern  . Not on file  Social History Narrative  . Not on file   Social Determinants of Health   Financial Resource Strain:   . Difficulty of Paying Living Expenses: Not on file    Food Insecurity:   . Worried About Charity fundraiser in the Last Year: Not on file  . Ran Out of Food in the Last Year: Not on file  Transportation Needs:   . Lack of Transportation (Medical): Not on file  . Lack of Transportation (Non-Medical): Not on file  Physical Activity:   . Days of Exercise per Week: Not on file  . Minutes of Exercise per Session: Not on file  Stress:   . Feeling of Stress : Not on file  Social Connections:   . Frequency of Communication with Friends and Family: Not on file  .  Frequency of Social Gatherings with Friends and Family: Not on file  . Attends Religious Services: Not on file  . Active Member of Clubs or Organizations: Not on file  . Attends Archivist Meetings: Not on file  . Marital Status: Not on file  Intimate Partner Violence:   . Fear of Current or Ex-Partner: Not on file  . Emotionally Abused: Not on file  . Physically Abused: Not on file  . Sexually Abused: Not on file    Family History:    Family History  Problem Relation Age of Onset  . Heart disease Mother   . Colon cancer Father   . Obesity Sister      ROS:  Please see the history of present illness.   All other ROS reviewed and negative.     Physical Exam/Data:   Vitals:   01/16/2020 1900 12/23/2019 1930 01/13/2020 2112 12/22/19 0600  BP: 130/64 (!) 141/65 123/66 118/68  Pulse: 90 90 91 88  Resp: 19 (!) 24 (!) 22 20  Temp:   (!) 97.5 F (36.4 C) (!) 97.5 F (36.4 C)  TempSrc:   Oral Oral  SpO2: 100% 99% 99% 99%  Weight:      Height:        Intake/Output Summary (Last 24 hours) at 12/22/2019 1322 Last data filed at 12/22/2019 0630 Gross per 24 hour  Intake 120 ml  Output 900 ml  Net -780 ml   Last 3 Weights 01/03/2020 12/04/2019 12/03/2019  Weight (lbs) 118 lb 132 lb 7.9 oz 131 lb 9.8 oz  Weight (kg) 53.524 kg 60.1 kg 59.7 kg     Body mass index is 17.94 kg/m.  General:  Thin, frail cachetic older WM. Sitting up in bed. HEENT: normal Lymph: no  adenopathy Neck: no JVD Endocrine:  No thryomegaly Vascular: No carotid bruits; FA pulses 2+ bilaterally without bruits  Cardiac:  normal S1, S2; RRR; 4/6 systolic murmur Lungs:  Diminished in lower lobes.  Abd: soft, nontender, no hepatomegaly  Ext: no edema Musculoskeletal:  No deformities, BUE and BLE strength normal and equal Skin: warm and dry  Neuro:  CNs 2-12 intact, no focal abnormalities noted Psych:  Normal affect   EKG:  The EKG was personally reviewed and demonstrates: SR with prolonged PR, LVH, TWI in inferolateral leads.  Telemetry:  Telemetry was personally reviewed and demonstrates: SR     Relevant CV Studies:  TTE: 11/25/19  IMPRESSIONS    1. The aortic valve is abnormal. Aortic valve regurgitation is mild to  moderate. Very severe aortic valve stenosis. Aortic valve mean gradient  measures 60.0 mmHg. Valve area calculation may be inaccurate due to  suboptimal LVOT doppler. Consider  cardiology consultation for very severe aortic valve stenosis.  2. The mitral valve is abnormal in structure. At least moderate,  eccentric mitral valve regurgitation. No evidence of mitral stenosis.  3. The tricuspid valve is normal in structure. Tricuspid valve  regurgitation is moderate-severe.  4. Left ventricular ejection fraction, by visual estimation, is 55 to  60%. The left ventricle has normal function. There is mildly increased  left ventricular hypertrophy.  5. Elevated left ventricular end-diastolic pressure.  6. Left ventricular diastolic parameters are consistent with Grade II  diastolic dysfunction (pseudonormalization).  7. Global right ventricle has normal systolic function.The right  ventricular size is normal. No increase in right ventricular wall  thickness.  8. Moderately elevated pulmonary artery systolic pressure.  9. Left atrial size was moderately dilated.  10.  Right atrial size was mildly dilated.  11. Moderate mitral annular calcification.   12. There is severe calcifcation of the aortic valve.  13. The pulmonic valve was normal in structure. Pulmonic valve  regurgitation is trivial.  14. The inferior vena cava is normal in size with greater than 50%  respiratory variability, suggesting right atrial pressure of 3 mmHg.  15. The interatrial septum was not well visualized.   Laboratory Data:  High Sensitivity Troponin:   Recent Labs  Lab 01/05/2020 1619 01/17/2020 2203  TROPONINIHS 237* 255*     Chemistry Recent Labs  Lab 01/05/2020 1313 12/26/2019 2203 12/22/19 0421  NA 141  --  146*  K 5.0  --  4.5  CL 107  --  110  CO2 21*  --  21*  GLUCOSE 119*  --  97  BUN 91*  --  91*  CREATININE 3.16* 3.06* 3.06*  CALCIUM 9.9  --  9.6  GFRNONAA 17* 17* 17*  GFRAA 19* 20* 20*  ANIONGAP 13  --  15    Recent Labs  Lab 12/22/19 0421  PROT 5.3*  ALBUMIN 3.4*  AST 29  ALT 60*  ALKPHOS 117  BILITOT 1.0   Hematology Recent Labs  Lab 01/07/2020 1313 01/07/2020 2203 12/22/19 0421  WBC 14.8* 12.3* 11.8*  RBC 4.47 4.35 4.20*  HGB 13.8 13.7 13.4  HCT 44.2 42.2 41.3  MCV 98.9 97.0 98.3  MCH 30.9 31.5 31.9  MCHC 31.2 32.5 32.4  RDW 17.7* 17.5* 17.4*  PLT 83* 73* 72*   BNP Recent Labs  Lab 01/17/2020 1755  BNP >4,500.0*    DDimer  Recent Labs  Lab 12/23/2019 2203 12/22/19 0858  DDIMER 7.62* 8.49*     Radiology/Studies:  DG Chest 2 View  Result Date: 12/22/2019 CLINICAL DATA:  86 year old male with shortness of breath. My as the knee a gravis. EXAM: CHEST - 2 VIEW COMPARISON:  Chest CT and radiographs yesterday, and earlier. FINDINGS: Portable AP semi upright view at 0704 hours. Mediastinal contours remain normal. Veiling opacity in both lower lungs has increased from the portable chest yesterday. Superimposed pulmonary vascular congestion. No pneumothorax. No air bronchograms identified. No acute osseous abnormality identified. IMPRESSION: Bilateral pleural effusions and pulmonary interstitial edema. Pleural fluid  volume may be mildly increased since yesterday. Electronically Signed   By: Genevie Ann M.D.   On: 12/22/2019 08:59   CT CHEST WO CONTRAST  Result Date: 12/23/2019 CLINICAL DATA:  Pneumonia.  Myasthenia gravis. EXAM: CT CHEST WITHOUT CONTRAST TECHNIQUE: Multidetector CT imaging of the chest was performed following the standard protocol without IV contrast. COMPARISON:  Chest radiograph dated 01/02/2020. CT chest dated 04/16/2018 FINDINGS: Cardiovascular: Heart is normal in size.  No pericardial effusion. No evidence of thoracic aortic aneurysm. Dense calcifications of the aortic root/valve. Atherosclerotic calcifications of the aortic arch. Coronary atherosclerosis of the LAD and left circumflex. Mediastinum/Nodes: No suspicious medial lymphadenopathy. Visualized thyroid is unremarkable. Lungs/Pleura: Mild scattered ground-glass opacities in the lungs bilaterally, including in the bilateral suprahilar/perihilar regions, favoring mild interstitial edema over infection. Moderate bilateral pleural effusions. Associated mild lower lobe compressive atelectasis. Scattered peribronchovascular hyperdensities in the right middle and lower lobes favors sequela of prior aspiration mild biapical pleural-parenchymal scarring. No suspicious pulmonary nodules. No pneumothorax. Upper Abdomen: Visualized upper abdomen is notable for vascular calcifications, a 2.3 cm left upper pole renal cyst, and a 4 mm nonobstructing left renal calculus (series 3/image 168). Musculoskeletal: Degenerative changes of the visualized thoracolumbar spine. IMPRESSION: Suspected mild interstitial  edema. Moderate bilateral pleural effusions. Associated mild lower lobe compressive atelectasis. No findings suspicious for pneumonia. Dense calcifications of the aortic root/valve in this patient with known aortic stenosis. Aortic Atherosclerosis (ICD10-I70.0). Electronically Signed   By: Julian Hy M.D.   On: 12/29/2019 17:51   US RENAL  Result Date:  12/22/2019 CLINICAL DATA:  Acute renal injury EXAM: RENAL / URINARY TRACT ULTRASOUND COMPLETE COMPARISON:  None. FINDINGS: Right Kidney: Renal measurements: 9.2 x 4.3 x 4.1 cm. = volume: 84 mL. Cysts are again identified the largest of which lies in the upper pole measuring 1.9 cm. Diffuse increased echogenicity is noted. Left Kidney: Renal measurements: 7.7 x 4.0 x 3.7 cm. = volume: 59 mL. Only limited evaluation of the left kidney is noted due to overlying bowel gas. Bladder: Not well visualized Other: None. IMPRESSION: Limited exam due to poor visualization of the left kidney and urinary bladder. Right renal cysts stable from the prior exam. Electronically Signed   By: Inez Catalina M.D.   On: 12/30/2019 23:59   DG Chest Portable 1 View  Result Date: 12/26/2019 CLINICAL DATA:  Shortness of breath. EXAM: PORTABLE CHEST 1 VIEW COMPARISON:  Chest radiograph 11/24/2019 FINDINGS: Mild cardiomegaly, unchanged. Aortic atherosclerosis. Central pulmonary vascular congestion has increased from prior examination. Gradient opacity at the right greater than left lung bases likely reflecting layering pleural effusions. There are superimposed interstitial and ill-defined airspace opacities with a mid to lower lung predominance likely reflecting edema. No evidence of pneumothorax. Thoracic spondylosis. No acute bony abnormality. Overlying cardiac monitoring leads. In opacity projects over the medial right lung apex and superior mediastinum likely reflecting a skin fold. IMPRESSION: entral pulmonary vascular congestion has increased from prior exam 11/24/2019. Also increased from prior exam, there are bilateral interstitial and ill-defined airspace opacities with a mid to lower lung predominance, favored to reflect edema. Infection cannot be definitively excluded. Layering bilateral pleural effusions. Mild cardiomegaly with aortic atherosclerosis. Electronically Signed   By: Kellie Simmering DO   On: 01/05/2020 16:34     Assessment and Plan:   Jason Riley is a 86 y.o. male with a hx of HTN, hypothyroidism, myasthenia gravis, prostate Ca and severe AS who is being seen today for the evaluation of severe AS at the request of Dr. Alfredia Ferguson.  1. Severe Aortic Stenosis: he has been followed by Dr. Clayborn Bigness as an outpatient for his AS. Last seen back in 8/20 and medical therapy was continued as he was relatively asymptomatic at that time. He presents now with weakness and CHF. Appears he has really not improved since his recent hospitalization for MG. Seems failure to thrive this admission. Echo from 2019 reported severe AS and now gradient is worse. With his elevated Cr would not consider a good candidate for invasive work up for TAVR. If he improves clinically back to a more functional baseline could consider. Sent for thoracentesis this morning but felt not have a safe window to pull from. -- would continue to manage medically at this time and diuresis as tolerated.   2. AKI with CKD III: Cr last admission was around 1.7, admitted at 3 in the setting of poor oral intake and dehydration. Not a candidate for invasive therapy at this time.   3. Generalized weakness and failure to thrive: has not been eating and drinking prior admission. Says he has been unable to keep any food down for several days. With his age, renal dysfunction, critical AS if he does not improve may need to  have goals of care discussion moving forward.   4. Severe malnutrition: very frail. BMI of 17. Nutrition consulted via primary  5. Myasthenia Gravis: recent admission for the same. Management per primary  For questions or updates, please contact [place] Please consult [email or web address] for contact info under     Signed, Reino Bellis, NP  12/22/2019 1:22 PM   I have personally seen and examined this patient. I agree with the assessment and plan as outlined above.  Mr. Jason Riley is a 86 yo male with history of CKD, severe aortic stenosis,  HTN, hypothyroidism, prostate cancer and recent myasthenia gravis who is admitted with failure to thrive and dyspnea and found to have pulmonary edema, pleural effusion and acute on chronic renal failure. Recent admission with myasthenia gravis. He has not done well since then. He has been unable to keep down any solid food for several weeks. He has had poor liquid intake. He was initially aggressively hydrated and started on antibiotics for possible pneumonia. Chest CT with interstitial edema and bilateral pleural effusions. His fluids were stopped and he was diuresed with some improvement in dyspnea. Baseline creatinine around 1.7. Creatinine on admission 3.16, now down to 3.06. His acute renal failure is felt to be due to dehydration given poor po intake over the past week. Echo from 11/25/19 with normal LV systolic function, critical AS (mean gradient 60 mmHg, peak gradient 94.6 mmHg, AVA 0.44 cm2, dimensionless index 0.11). There is mild to moderate AI and moderate MR. I have personally reviewed the echo images.  I have reviewed the labs.  EKG is personally reviewed by me and shows sinus rhythm with LVH He tells me today that he has been followed at [facility] by Dr. Clayborn Bigness with the [facility] cardiology clinic for the past 10 years. He is known to have severe AS. He has been asymptomatic from this according to office notes. He last saw Dr. Clayborn Bigness in August 2020.   My exam:  General: Thin, cachectic male in NAD HEENT: OP clear, mucus membranes moist  SKIN: warm, dry. No rashes. Neuro: No focal deficits  Musculoskeletal: Muscle strength 5/5 all ext  Psychiatric: Mood and affect normal  Neck: No JVD, no carotid bruits, no thyromegaly, no lymphadenopathy.  Lungs:Bibasilar crackles.  Cardiovascular: Regular rate and rhythm. Distant systolic murmur.  Abdomen:Soft. Bowel sounds present. Non-tender.  Extremities: No lower extremity edema. Pulses are 2 + in the bilateral DP/PT.  Plan: Critical aortic  stenosis: He has critical AS. I have reviewed the echo images. The aortic valve leaflets are thickened and calcified and do not open well. The mean gradient is over 60 mmHg with an AVA of 0.44 cm2 and DI of 0.11. He unfortunately is now failing to thrive with worsened renal function, inability to keep down solid foods and global weakness. He would not be a candidate for consideration of surgical AVR. I would not consider him a candidate for TAVR at this time given his overall poor functional state, cachexia, failure to thrive and renal failure. At this time, I would continue volume control with diuretics as tolerated. He understands that he is approaching end of life issues. Consider palliative care consultation. We will follow along to assist in fluid management.   Lauree Chandler 12/22/2019 3:27 PM

## 2019-12-22 NOTE — Progress Notes (Signed)
PROGRESS NOTE    Jason Riley  [id number] DOB: May 19, 1929 DOA: 01/05/2020 PCP: [name], MD   Brief Narrative:  Jason Riley is a 86 y.o. male with medical history significant for but not limited to severe aortic stenosis, history of prostate cancer status post prostatectomy, myasthenia gravis, hyperlipidemia, hypothyroidism as well as other comorbidities who presents with a chief complaint of generalized weakness and dyspnea on exertion for the last week or so which is progressively gotten worse.  Per family he has not had proper oral intake and has not been as active.  Family noticed a marked decline in his oral intake and patient gets short of breath with minimal activity now.  He denies any chest pain, lightheadedness but family does admit that he has a dry hacking cough.  Of note patient was recently hospitalized 2 weeks ago for a myasthenia gravis exacerbation and had to have plasmapheresis and evaluated by neurology.  Over the last week or so he has not been feeling well.  TRH was asked to admit this patient for his generalized weakness and dyspnea on exertion as well as a new onset AKI on CKD stage III.  ED Course: In the ED had basic blood work done as well as a chest x-ray and EKG.  He was given 1 L bolus and started on antibiotics with IV ceftriaxone for suspected pneumonia.  Of note Covid testing was still pending  **Interim History Cardiology consulted and patient was given some diuresis.  Renal function mildly improved however patient remains severely deconditioned.  He does have critical aortic stenosis and cardiology feels that he is not a surgical candidate or a candidate for TAVR.  Palliative care consulted for further goals.  We will continue diuresis and encourage p.o. intake.  Assessment & Plan:   Active Problems:   Aortic stenosis with mitral and aortic insufficiency   CKD (chronic kidney disease), stage III (HCC)   Hyperlipidemia   Hypothyroid   History of  prostate cancer   AKI (acute kidney injury) ([place])   Severe aortic stenosis   Weakness generalized   Presbycusis of both ears   Protein-calorie malnutrition, severe   Dyspnea on exertion   Pressure injury of skin  Dyspnea on exertion and acute respiratory failure with hypoxia in setting of acute on chronic diastolic CHF and critical aortic stenosis -Admit to inpatient telemetry -Initially thought to be secondary to a pneumonia present on admission however his Covid testing is still pending and he does have severe aortic stenosis this could be attributing;  testing was negative so unlikely Covid -CXR showed "Central pulmonary vascular congestion has increased from prior exam 11/24/2019. Also increased from prior exam, there are bilateral interstitial and ill-defined airspace opacities with a mid to lower lung predominance, favored to reflect edema. Infection cannot be definitively excluded. Layering bilateral pleural effusions. Mild cardiomegaly with aortic atherosclerosis." -Clinically appeared a little dry on examination so he was given 1 L normal saline bolus in the ED and he was initially placed on maintenance fluids which was then stopped due to his CT scan -On exam he did appear to have some crackles so we will try not to over hydrate -Recently was hospitalized for myasthenia gravis and will continue with medications and we actually takes 500 mg pyridostigmine nightly -Check NIF and his forced vital capacity every 8 hours -Check a CT  of the chest showed CT reviewed and showed suspected mild interstitial edema and moderate bilateral pleural effusions associated with mild  lower lobe compressive atelectasis no suspicious findings for pneumonia.  -Ordered some IV Lasix yesterday and again this morning and will continue to monitor his response -Order thoracentesis for his bilateral pleural effusions but there is no safe window for them to be done -Cardiology consulted and feels that he has  critical aortic stenosis and he is not a candidate for TAVR or surgical AVR. -Start the patient on Combivent -Check Troponin x2; Initial Troponin was 237 and repeat was 235 -BNP was greater than 4500 -Patient had a leukocytosis on admission and will continue to monitor WBC count as it was 14.8 and will need to monitor response and it is improved to 11.8 -Also given guaifenesin, flutter valve and incentive spirometry -We will consult Cardiology to weigh in given his severe aortic stenosis and see below -Will not repeat echocardiogram as recent echo was just done earlier this month -Started the patient on IV ceftriaxone for his suspected pneumonia and also had added azithromycin but will stop azithromycin as it can worsen myasthenia gravis and will likely stop IV ceftriaxone as we do not feel there is later pneumonia -Currently he is requiring 2 L of supplemental oxygen via nasal cannula to maintain O2 saturations and will need to continue to wean oxygen as tolerated and -Check SLP just to make sure is not aspirating given his myasthenia gravis and they recommend a dysphagia 3 diet with thin liquids -Repeat chest x-ray in a.m.  Generalized Weakness and poor Po Intake -As above -Consult nutritionist for further evaluation recommendations -Check TSH was 2.970, urinalysis, urine culture -Urinalysis showed clear appearance with small hemoglobin, rare bacteria, negative nitrites, negative leukocytes 0-5 squamous epithelial cells and 0-5 WBCs -Check blood cultures x2 -Take acidosis acidosis was 2.3 in the setting of poor p.o. intake and dehydration now improved -Stop IV fluid hydration -Clinically appears dry on examination  Lactic Acidosis -It was slightly slightly elevated 2.2 on admission -IV fluids stopped as lactic acidosis improved -Continue monitor and trend lactic acid level  AKI on CKD stage IIIb Metabolic acidosis -Was recently discharged with a baseline creatinine of  1.7 -BUN/creatinine on admission was 91/3.6 likely in the setting of poor p.o. intake and mild dehydration -IV fluid hydration as above now stopped -Check Urine Lytes and Renal U/S; no ultrasound showed no hydronephrosis -avoid nephrotoxic medications, contrast dyes, hypotension and renally adjust medications -Continue monitor and trend renal function -Repeat CMP in a.m.  Protein calorie malnutrition which is severe -Nutritionist consulted -Continue with supplementations of Ensure Enlive 237 mils p.o. twice daily -Nutritionist has added continue multivitamins, Magic cup 3 times daily with meals as well as Juven twice daily  Myasthenia Gravis -Recently admitted and had -Continue to monitor NIF and forced vital capacity every 8 hours -Recently had plasmapheresis -Continue with a dysphagia 3 diet Continue mycophenolate 250 mg p.o. twice daily as well as pyridostigmine 30 mg p.o. 4 times daily and prednisone 20 mg p.o. daily -If continues to worsen will need formal neurology consultation  Hypothyroidism -Check TSH  -Continue with levothyroxine 50 mcg p.o. daily  Hyperlipidemia -Continue Rosuvastatin 20 mg p.o. daily  Critical Aortic stenosis Acute on chronic chronic diastolic grade 2 dysfunction -Severe and will consult cardiology for further evaluation recommendations as this may be causing his dyspnea -Recent ECHO showed Left Ventricle: Left ventricular ejection fraction, by visual estimation,  is 55 to 60%. The left ventricle has normal function. The left ventricle has no regional wall motion abnormalities. There is mildly increased left  ventricular hypertrophy.  Left  ventricular diastolic parameters are consistent with Grade II diastolic  dysfunction (pseudonormalization). Elevated left ventricular end-diastolic  Pressure.   Aortic Valve: The aortic valve is abnormal. Aortic valve regurgitation is  mild to moderate. Aortic regurgitation PHT measures 339 msec. Severe  aortic  stenosis is present. There is severe calcifcation of the aortic  valve. Aortic valve mean gradient  measures 60.0 mmHg. Aortic valve peak gradient measures 94.6 mmHg. Aortic  valve area, by VTI measures 0.47 cm -Appreciate cardiology evaluation and further recommendations -BNP was greater than 4500 -Neurology evaluated and reviewed his echo images and he is unfortunately failing to thrive with worsening renal function and inability diastolically to the low weakness Cardiology feels he is not a candidate for consideration of surgical AVR daily would not consider him a candidate for TAVR given his overall poor functional status, cachexia, failure to thrive and renal failure as above -Recommend controlling volume with diuretics as tolerated and he received IV Lasix yesterday and IV Lasix today -Cardiology discussed with patient that he is approaching end-of-life issues and I have consulted palliative care for further goals of care discussion -I had a very lengthy discussion with the patient's niece Judson Roch and she is understanding of the patient is poor prognosis and mortality  Mildly Elevated Troponin -In the setting of Above -troponin was 255 -Will have Cardiology Evaluate -Patient not complaining of Chest Pain but has a Dry hacking cough per Family   Leukocytosis -Mild at 14.8 and could be in the setting of infection but also patient is on prednisone 20 mg p.o. daily, now trending down and is now 11.8 could be combination of steroid demargination  -Continue monitor for signs and symptoms of infection -Checking Legionella and strep and urine antigens -Continue to monitor temperature curve and repeat CBC in a.m.  Thrombocytopenia -Patient's platelet count is somewhat worse and when he got discharged on 12/13/2019 -Patient's platelet count went from 139 and is now 83 today at 72 -Continue to monitor for signs and symptoms of bleeding; currently no overt bleeding noted -Repeat CBC in  a.m.  DVT prophylaxis: SCDs Code Status: FULL CODE  Family Communication: Discussed with Niece Fredricka Bonine extensively over the phone  Disposition Plan: Patient is from an independent living facility who presented with failure to thrive and he has not been eating well.  Pending the palliative care evaluation will need to adjust his goals of care as he has critical aortic stenosis likely contributing to his respiratory distress and his renal function is abnormal in the setting of his poor p.o. intake.  We have PT OT to evaluate and treat for a safe discharge disposition   Consultants:   Cardiology  Palliative Care  Discussed the case with Neurology   Procedures: None  Antimicrobials:  Anti-infectives (From admission, onward)   Start     Dose/Rate Route Frequency Ordered Stop   12/22/19 1800  cefTRIAXone (ROCEPHIN) 2 g in sodium chloride 0.9 % 100 mL IVPB  Status:  Discontinued     2 g 200 mL/hr over 30 Minutes Intravenous Every 24 hours 01/02/2020 2015 12/22/19 2041   01/20/2020 1645  cefTRIAXone (ROCEPHIN) 2 g in sodium chloride 0.9 % 100 mL IVPB     2 g 200 mL/hr over 30 Minutes Intravenous  Once 01/18/2020 1641 01/03/2020 1851   12/26/2019 1645  azithromycin (ZITHROMAX) 500 mg in sodium chloride 0.9 % 250 mL IVPB  Status:  Discontinued     500 mg 250 mL/hr over 60 Minutes Intravenous  Once 01/17/2020 1641 12/26/2019 1811     Subjective: And examined at bedside he is extremely hard of hearing.  States that his shortness of breath is slightly better.  No lightheadedness or dizziness.  Unable to have fluid pulled off on his ultrasound via IR.  No other concerns or points at this time.  Objective: Vitals:   01/15/2020 1900 01/08/2020 1930 01/20/2020 2112 12/22/19 0600  BP: 130/64 (!) 141/65 123/66 118/68  Pulse: 90 90 91 88  Resp: 19 (!) 24 (!) 22 20  Temp:   (!) 97.5 F (36.4 C) (!) 97.5 F (36.4 C)  TempSrc:   Oral Oral  SpO2: 100% 99% 99% 99%  Weight:      Height:        Intake/Output  Summary (Last 24 hours) at 12/22/2019 2032 Last data filed at 12/22/2019 1400 Gross per 24 hour  Intake 360 ml  Output 2700 ml  Net -2340 ml   Filed Weights   01/19/2020 1300  Weight: 53.5 kg   Examination: Physical Exam:  Constitutional: Thin cachectic chronically ill-appearing Caucasian male currently no acute distress appears calm Eyes: Lids and conjunctivae normal, sclerae anicteric  ENMT: External Ears, Nose appear normal.  Extremely hard of hearing Neck: Appears normal, supple, no cervical masses, normal ROM, no appreciable thyromegaly Respiratory: Diminished to auscultation bilaterally with coarse breath sounds and some crackles bilaterally, no wheezing, rales, rhonchi or crackles. Normal respiratory effort and patient is not tachypenic. No accessory muscle use.  Wearing supplemental oxygen via nasal cannula Cardiovascular: RRR, 3/6 systolic murmur. S1 and S2 auscultated. No extremity edema.  Abdomen: Soft, non-tender, non-distended. No masses palpated. No appreciable hepatosplenomegaly. Bowel sounds positive.  GU: Deferred. Musculoskeletal: No clubbing / cyanosis of digits/nails. No joint deformity upper and lower extremities.  Skin: No rashes, but does have a small sacral wound and does also have seborrheic keratosis on a limited skin evaluation no induration; Warm and dry.  Neurologic: CN 2-12 grossly intact with no focal deficits. Romberg sign cerebellar reflexes not assessed.  Psychiatric: Normal judgment and insight. Alert and oriented x 3. Normal mood and appropriate affect.    Data Reviewed: I have personally reviewed following labs and imaging studies  CBC: Recent Labs  Lab 01/10/2020 1313 01/16/2020 2203 12/22/19 0421  WBC 14.8* 12.3* 11.8*  NEUTROABS  --   --  10.5*  HGB 13.8 13.7 13.4  HCT 44.2 42.2 41.3  MCV 98.9 97.0 98.3  PLT 83* 73* 72*   Basic Metabolic Panel: Recent Labs  Lab 01/08/2020 1313 01/20/2020 2203 12/22/19 0421  NA 141  --  146*  K 5.0  --  4.5   CL 107  --  110  CO2 21*  --  21*  GLUCOSE 119*  --  97  BUN 91*  --  91*  CREATININE 3.16* 3.06* 3.06*  CALCIUM 9.9  --  9.6   GFR: Estimated Creatinine Clearance: 12.6 mL/min (A) (by C-G formula based on SCr of 3.06 mg/dL (H)). Liver Function Tests: Recent Labs  Lab 12/22/19 0421  AST 29  ALT 60*  ALKPHOS 117  BILITOT 1.0  PROT 5.3*  ALBUMIN 3.4*   No results for input(s): LIPASE, AMYLASE in the last 168 hours. No results for input(s): AMMONIA in the last 168 hours. Coagulation Profile: No results for input(s): INR, PROTIME in the last 168 hours. Cardiac Enzymes: No results for input(s): CKTOTAL, CKMB, CKMBINDEX, TROPONINI in the last 168 hours. BNP (last 3 results) No results for input(s): PROBNP  in the last 8760 hours. HbA1C: No results for input(s): HGBA1C in the last 72 hours. CBG: Recent Labs  Lab 01/17/2020 1926  GLUCAP 101*   Lipid Profile: No results for input(s): CHOL, HDL, LDLCALC, TRIG, CHOLHDL, LDLDIRECT in the last 72 hours. Thyroid Function Tests: Recent Labs    12/27/2019 2203  TSH 2.970   Anemia Panel: Recent Labs    01/14/2020 2203  FERRITIN 242   Sepsis Labs: Recent Labs  Lab 01/08/2020 1624 01/06/2020 1818  LATICACIDVEN 2.3* 1.8    Recent Results (from the past 240 hour(s))  Blood culture (routine x 2)     Status: None (Preliminary result)   Collection Time: 01/11/2020  4:24 PM   Specimen: BLOOD  Result Value Ref Range Status   Specimen Description BLOOD RIGHT ANTECUBITAL  Final   Special Requests   Final    BOTTLES DRAWN AEROBIC AND ANAEROBIC Blood Culture adequate volume Performed at [facility], [place] [address], [place], [place] [address]    Culture NO GROWTH < 24 HOURS  Final   Report Status PENDING  Incomplete  Blood culture (routine x 2)     Status: None (Preliminary result)   Collection Time: 12/23/2019  6:08 PM   Specimen: BLOOD  Result Value Ref Range Status   Specimen Description BLOOD LEFT ANTECUBITAL  Final    Special Requests   Final    BOTTLES DRAWN AEROBIC AND ANAEROBIC Blood Culture adequate volume Performed at [facility], [place] [address] [place], [place] [address]    Culture NO GROWTH < 24 HOURS  Final   Report Status PENDING  Incomplete  SARS CORONAVIRUS 2 (TAT 6-24 HRS) Nasopharyngeal Nasopharyngeal Swab     Status: None   Collection Time: 01/14/2020  6:24 PM   Specimen: Nasopharyngeal Swab  Result Value Ref Range Status   SARS Coronavirus 2 NEGATIVE NEGATIVE Final    Comment: (NOTE) SARS-CoV-2 target nucleic acids are NOT DETECTED. The SARS-CoV-2 RNA is generally detectable in upper and lower respiratory specimens during the acute phase of infection. Negative results do not preclude SARS-CoV-2 infection, do not rule out co-infections with other pathogens, and should not be used as the sole basis for treatment or other patient management decisions. Negative results must be combined with clinical observations, patient history, and epidemiological information. The expected result is Negative. Fact Sheet for Patients: SugarRoll.be Fact Sheet for Healthcare Providers: https://www.woods-mathews.com/ This test is not yet approved or cleared by the [place] FDA and  has been authorized for detection and/or diagnosis of SARS-CoV-2 by FDA under an Emergency Use Authorization (EUA). This EUA will remain  in effect (meaning this test can be used) for the duration of the COVID-19 declaration under Section 56 4(b)(1) of the Act, 21 U.S.C. section 360bbb-3(b)(1), unless the authorization is terminated or revoked sooner. Performed at [facility], [place] [address] [place], [place] [address]     RN Pressure Injury Documentation: Pressure Injury 01/09/2020 Coccyx Stage 2 -  Partial thickness loss of dermis presenting as a shallow open injury with a red, pink wound bed without slough. (Active)  12/23/2019 2100  Location: Coccyx  Location  Orientation:   Staging: Stage 2 -  Partial thickness loss of dermis presenting as a shallow open injury with a red, pink wound bed without slough.  Wound Description (Comments):   Present on Admission: Yes     Estimated body mass index is 17.94 kg/m as calculated from the following:   Height as  of this encounter: 5\' 8"  (1.727 m).   Weight as of this encounter: 53.5 kg.  Malnutrition Type:  Nutrition Problem: Inadequate oral intake Etiology: chronic illness, wound healing(myasthenia gravis; stage II PI; coccyx)   Malnutrition Characteristics:  Signs/Symptoms: estimated needs, percent weight loss(per chart, family reports progressive decline in po intake) Percent weight loss: 11 %(15 lbs in 1 month)   Nutrition Interventions:  Interventions: Ensure Enlive (each supplement provides 350kcal and 20 grams of protein), Magic cup, Juven   Radiology Studies: DG Chest 2 View  Result Date: 12/22/2019 CLINICAL DATA:  86 year old male with shortness of breath. My as the knee a gravis. EXAM: CHEST - 2 VIEW COMPARISON:  Chest CT and radiographs yesterday, and earlier. FINDINGS: Portable AP semi upright view at 0704 hours. Mediastinal contours remain normal. Veiling opacity in both lower lungs has increased from the portable chest yesterday. Superimposed pulmonary vascular congestion. No pneumothorax. No air bronchograms identified. No acute osseous abnormality identified. IMPRESSION: Bilateral pleural effusions and pulmonary interstitial edema. Pleural fluid volume may be mildly increased since yesterday. Electronically Signed   By: Genevie Ann M.D.   On: 12/22/2019 08:59   CT CHEST WO CONTRAST  Result Date: 01/11/2020 CLINICAL DATA:  Pneumonia.  Myasthenia gravis. EXAM: CT CHEST WITHOUT CONTRAST TECHNIQUE: Multidetector CT imaging of the chest was performed following the standard protocol without IV contrast. COMPARISON:  Chest radiograph dated 12/29/2019. CT chest dated 04/16/2018 FINDINGS:  Cardiovascular: Heart is normal in size.  No pericardial effusion. No evidence of thoracic aortic aneurysm. Dense calcifications of the aortic root/valve. Atherosclerotic calcifications of the aortic arch. Coronary atherosclerosis of the LAD and left circumflex. Mediastinum/Nodes: No suspicious medial lymphadenopathy. Visualized thyroid is unremarkable. Lungs/Pleura: Mild scattered ground-glass opacities in the lungs bilaterally, including in the bilateral suprahilar/perihilar regions, favoring mild interstitial edema over infection. Moderate bilateral pleural effusions. Associated mild lower lobe compressive atelectasis. Scattered peribronchovascular hyperdensities in the right middle and lower lobes favors sequela of prior aspiration mild biapical pleural-parenchymal scarring. No suspicious pulmonary nodules. No pneumothorax. Upper Abdomen: Visualized upper abdomen is notable for vascular calcifications, a 2.3 cm left upper pole renal cyst, and a 4 mm nonobstructing left renal calculus (series 3/image 168). Musculoskeletal: Degenerative changes of the visualized thoracolumbar spine. IMPRESSION: Suspected mild interstitial edema. Moderate bilateral pleural effusions. Associated mild lower lobe compressive atelectasis. No findings suspicious for pneumonia. Dense calcifications of the aortic root/valve in this patient with known aortic stenosis. Aortic Atherosclerosis (ICD10-I70.0). Electronically Signed   By: Julian Hy M.D.   On: 01/10/2020 17:51   US RENAL  Result Date: 01/01/2020 CLINICAL DATA:  Acute renal injury EXAM: RENAL / URINARY TRACT ULTRASOUND COMPLETE COMPARISON:  None. FINDINGS: Right Kidney: Renal measurements: 9.2 x 4.3 x 4.1 cm. = volume: 84 mL. Cysts are again identified the largest of which lies in the upper pole measuring 1.9 cm. Diffuse increased echogenicity is noted. Left Kidney: Renal measurements: 7.7 x 4.0 x 3.7 cm. = volume: 59 mL. Only limited evaluation of the left kidney is  noted due to overlying bowel gas. Bladder: Not well visualized Other: None. IMPRESSION: Limited exam due to poor visualization of the left kidney and urinary bladder. Right renal cysts stable from the prior exam. Electronically Signed   By: Inez Catalina M.D.   On: 01/11/2020 23:59   DG Chest Portable 1 View  Result Date: 01/15/2020 CLINICAL DATA:  Shortness of breath. EXAM: PORTABLE CHEST 1 VIEW COMPARISON:  Chest radiograph 11/24/2019 FINDINGS: Mild cardiomegaly, unchanged. Aortic atherosclerosis. Central pulmonary  vascular congestion has increased from prior examination. Gradient opacity at the right greater than left lung bases likely reflecting layering pleural effusions. There are superimposed interstitial and ill-defined airspace opacities with a mid to lower lung predominance likely reflecting edema. No evidence of pneumothorax. Thoracic spondylosis. No acute bony abnormality. Overlying cardiac monitoring leads. In opacity projects over the medial right lung apex and superior mediastinum likely reflecting a skin fold. IMPRESSION: entral pulmonary vascular congestion has increased from prior exam 11/24/2019. Also increased from prior exam, there are bilateral interstitial and ill-defined airspace opacities with a mid to lower lung predominance, favored to reflect edema. Infection cannot be definitively excluded. Layering bilateral pleural effusions. Mild cardiomegaly with aortic atherosclerosis. Electronically Signed   By: Kellie Simmering DO   On: 01/01/2020 16:34   IR US CHEST  Result Date: 12/22/2019 CLINICAL DATA:  86 year old male referred for thoracentesis EXAM: CHEST ULTRASOUND COMPARISON:  None. FINDINGS: Scant pleural fluid. IMPRESSION: Scant pleural fluid on ultrasound without safe window for thoracentesis. Electronically Signed   By: Corrie Mckusick D.O.   On: 12/22/2019 16:35   Scheduled Meds: . aspirin EC  81 mg Oral Daily  . enoxaparin (LOVENOX) injection  30 mg Subcutaneous Q24H  . feeding  supplement (ENSURE ENLIVE)  237 mL Oral TID BM  . guaiFENesin  600 mg Oral BID  . levothyroxine  50 mcg Oral Q0600  . multivitamin with minerals  1 tablet Oral Daily  . [START ON 12/23/2019] mycophenolate  250 mg Oral q morning - 10a  . mycophenolate  500 mg Oral QHS  . nutrition supplement (JUVEN)  1 packet Oral BID BM  . predniSONE  20 mg Oral Q breakfast  . pyridostigmine  30 mg Oral QID  . rosuvastatin  20 mg Oral Daily   Continuous Infusions: . cefTRIAXone (ROCEPHIN)  IV 2 g (12/22/19 1714)    LOS: 1 day   Kerney Elbe, DO [facility] PAGER is on [place]  If 7PM-7AM, please contact night-coverage [email or web address]

## 2019-12-22 NOTE — Progress Notes (Signed)
NIF: -30 VC: 1250

## 2019-12-23 ENCOUNTER — Inpatient Hospital Stay (HOSPITAL_COMMUNITY): Payer: Medicare Other

## 2019-12-23 DIAGNOSIS — Z66 Do not resuscitate: Secondary | ICD-10-CM

## 2019-12-23 DIAGNOSIS — Z7189 Other specified counseling: Secondary | ICD-10-CM

## 2019-12-23 DIAGNOSIS — G7000 Myasthenia gravis without (acute) exacerbation: Secondary | ICD-10-CM

## 2019-12-23 DIAGNOSIS — D72829 Elevated white blood cell count, unspecified: Secondary | ICD-10-CM

## 2019-12-23 DIAGNOSIS — I080 Rheumatic disorders of both mitral and aortic valves: Secondary | ICD-10-CM

## 2019-12-23 DIAGNOSIS — R531 Weakness: Secondary | ICD-10-CM

## 2019-12-23 DIAGNOSIS — Z515 Encounter for palliative care: Secondary | ICD-10-CM

## 2019-12-23 DIAGNOSIS — R627 Adult failure to thrive: Secondary | ICD-10-CM

## 2019-12-23 DIAGNOSIS — R197 Diarrhea, unspecified: Secondary | ICD-10-CM

## 2019-12-23 LAB — COMPREHENSIVE METABOLIC PANEL
ALT: 46 U/L — ABNORMAL HIGH (ref 0–44)
AST: 23 U/L (ref 15–41)
Albumin: 2.7 g/dL — ABNORMAL LOW (ref 3.5–5.0)
Alkaline Phosphatase: 88 U/L (ref 38–126)
Anion gap: 13 (ref 5–15)
BUN: 117 mg/dL — ABNORMAL HIGH (ref 8–23)
CO2: 23 mmol/L (ref 22–32)
Calcium: 9.4 mg/dL (ref 8.9–10.3)
Chloride: 110 mmol/L (ref 98–111)
Creatinine, Ser: 2.89 mg/dL — ABNORMAL HIGH (ref 0.61–1.24)
GFR calc Af Amer: 21 mL/min — ABNORMAL LOW (ref >60)
GFR calc non Af Amer: 19 mL/min — ABNORMAL LOW (ref >60)
Glucose, Bld: 116 mg/dL — ABNORMAL HIGH (ref 70–99)
Potassium: 4.4 mmol/L (ref 3.5–5.1)
Sodium: 146 mmol/L — ABNORMAL HIGH (ref 135–145)
Total Bilirubin: 0.9 mg/dL (ref 0.3–1.2)
Total Protein: 4.8 g/dL — ABNORMAL LOW (ref 6.5–8.1)

## 2019-12-23 LAB — CBC WITH DIFFERENTIAL/PLATELET
Abs Immature Granulocytes: 0.06 10*3/uL (ref 0.00–0.07)
Basophils Absolute: 0 10*3/uL (ref 0.0–0.1)
Basophils Relative: 0 %
Eosinophils Absolute: 0 10*3/uL (ref 0.0–0.5)
Eosinophils Relative: 0 %
HCT: 38.2 % — ABNORMAL LOW (ref 39.0–52.0)
Hemoglobin: 12.2 g/dL — ABNORMAL LOW (ref 13.0–17.0)
Immature Granulocytes: 1 %
Lymphocytes Relative: 5 %
Lymphs Abs: 0.5 10*3/uL — ABNORMAL LOW (ref 0.7–4.0)
MCH: 31.9 pg (ref 26.0–34.0)
MCHC: 31.9 g/dL (ref 30.0–36.0)
MCV: 99.7 fL (ref 80.0–100.0)
Monocytes Absolute: 0.7 10*3/uL (ref 0.1–1.0)
Monocytes Relative: 6 %
Neutro Abs: 9.9 10*3/uL — ABNORMAL HIGH (ref 1.7–7.7)
Neutrophils Relative %: 88 %
Platelets: 66 10*3/uL — ABNORMAL LOW (ref 150–400)
RBC: 3.83 MIL/uL — ABNORMAL LOW (ref 4.22–5.81)
RDW: 17.3 % — ABNORMAL HIGH (ref 11.5–15.5)
WBC: 11.1 10*3/uL — ABNORMAL HIGH (ref 4.0–10.5)
nRBC: 0 % (ref 0.0–0.2)

## 2019-12-23 LAB — LEGIONELLA PNEUMOPHILA SEROGP 1 UR AG: L. pneumophila Serogp 1 Ur Ag: NEGATIVE

## 2019-12-23 LAB — PHOSPHORUS: Phosphorus: 4.4 mg/dL (ref 2.5–4.6)

## 2019-12-23 LAB — C DIFFICILE QUICK SCREEN W PCR REFLEX
C Diff antigen: POSITIVE — AB
C Diff interpretation: DETECTED
C Diff toxin: POSITIVE — AB

## 2019-12-23 LAB — GLUCOSE, CAPILLARY: Glucose-Capillary: 122 mg/dL — ABNORMAL HIGH (ref 70–99)

## 2019-12-23 LAB — OCCULT BLOOD X 1 CARD TO LAB, STOOL: Fecal Occult Bld: POSITIVE — AB

## 2019-12-23 LAB — MAGNESIUM: Magnesium: 2.7 mg/dL — ABNORMAL HIGH (ref 1.7–2.4)

## 2019-12-23 MED ORDER — VANCOMYCIN 50 MG/ML ORAL SOLUTION
125.0000 mg | Freq: Four times a day (QID) | ORAL | Status: DC
  Administered 2019-12-23: 125 mg via ORAL
  Filled 2019-12-23 (×4): qty 2.5

## 2019-12-23 MED ORDER — SODIUM CHLORIDE 0.9 % IV BOLUS: 250.0000 mL | Freq: Once | INTRAVENOUS | Status: DC

## 2019-12-23 MED ORDER — SODIUM CHLORIDE 0.9 % IV BOLUS
500.0000 mL | Freq: Once | INTRAVENOUS | Status: AC
  Administered 2019-12-23: 500 mL via INTRAVENOUS

## 2019-12-23 MED ORDER — SODIUM CHLORIDE 0.9 % IV BOLUS
250.0000 mL | Freq: Once | INTRAVENOUS | Status: AC
  Administered 2019-12-23: 250 mL via INTRAVENOUS

## 2019-12-23 MED ORDER — ALBUMIN HUMAN 25 % IV SOLN
25.0000 g | Freq: Once | INTRAVENOUS | Status: AC
  Administered 2019-12-23: 25 g via INTRAVENOUS
  Filled 2019-12-23: qty 100

## 2019-12-23 MED ORDER — FUROSEMIDE 40 MG PO TABS
40.0000 mg | ORAL_TABLET | Freq: Every day | ORAL | Status: DC
  Administered 2019-12-23: 40 mg via ORAL
  Filled 2019-12-23: qty 1

## 2019-12-23 MED ORDER — ALBUMIN HUMAN 5 % IV SOLN
12.5000 g | Freq: Once | INTRAVENOUS | Status: AC
  Administered 2019-12-24: 12.5 g via INTRAVENOUS
  Filled 2019-12-23: qty 250

## 2019-12-23 MED ORDER — SODIUM CHLORIDE 0.9 % IV BOLUS
500.0000 mL | Freq: Once | INTRAVENOUS | Status: AC
  Administered 2019-12-24: 500 mL via INTRAVENOUS

## 2019-12-23 NOTE — Consult Note (Addendum)
Neurology Consultation  Reason for Consult: Possible myasthenia gravis exacerbation Referring Physician: Eugenie Filler, MD  CC: Shortness of breath  History is obtained from: Daughter and chart  HPI: Jason Riley is a 86 y.o. male with history of severe aortic stenosis, myasthenia gravis, hypertension.  Patient was recently seen in the hospital for shortness of breath and difficulty swallowing secondary to not taking his myasthenia gravis medications.  He was discharged home.  Apparently his shortness of breath returned thus brought back to the hospital under the recommendations of his primary neurologist.  For this reason neurology was consulted.  Patient was recently seen between the.  Of 11/24/2019 and 12/03/2019.  At that time the main complaint was increasing generalized weakness, trouble swallowing and weight loss.  Patient lives by himself in a retirement community.  It was noted at that time that patient had stopped taking his medications for at least 4 to 5 months due to trouble refilling his medications via the phone.  During the hospitalization patient did receive 5 doses of plasma exchange.  He was started on Mestinon 30 mg 4 times daily.  By time of discharge patient was eating well and had good NIF and vital capacities.  Patient did see Dr. Melrose Nakayama his primary neurologist on 2/17.  At that time his plan was to keep him on CellCept 250 mg twice daily with the possibility of increasing his CellCept to 250 mg in the a.m. and 500 mg in the p.m.  He also made recommendations that if symptoms had returned possibly increase Mestinon from 30 mg 4 times daily to 60 mg 4 times daily.   Patient returned to the hospital on 01/12/2020.  Patient's main complaint at this time was shortness of breath on exertion and generalized weakness. CTA chest showed suspected mild interstitial edema and moderate bilateral pleural effusion associated with mild lower lobe compression atelectasis but no suspicion for  pneumonia.  Cardiology at this time feels patient is too frail and too sick to even consider any type of invasive cardiac evaluation or treatment   Patient is a poor historian and very hard of hearing.  I did call the daughter who does not live in close proximity to him.  Again she is worried that he is not taking his medications.  When asking patient if he has been taking medications he does say he has.  Currently he is laying in bed and does look slightly short of breath.   ED course-as above Relevant labs include - -WBC initially 14.8 now down to 11.1 -Platelets 66 -Sodium 1.5 -Creatinine 2.89 which is down from 3.16 -Urine culture showed 80,000 colonies of gram-negative rods   Past Medical History:  Diagnosis Date  . CN (constipation)   . Elevated transaminase level   . History of measles   . Hypertension   . Hypothyroidism   . Myasthenia gravis ([place])   . Myasthenia gravis ([place])   . Prostate cancer [facility])    Prostatectomy 2001  . Severe aortic stenosis    Family History  Problem Relation Age of Onset  . Heart disease Mother   . Colon cancer Father   . Obesity Sister    Social History:   reports that he has quit smoking. His smoking use included cigars. He quit after 15.00 years of use. His smokeless tobacco use includes chew. He reports that he does not drink alcohol or use drugs.  Medications  Current Facility-Administered Medications:  .  acetaminophen (TYLENOL) tablet 650 mg,  650 mg, Oral, Q6H PRN **OR** acetaminophen (TYLENOL) suppository 650 mg, 650 mg, Rectal, Q6H PRN, Sheikh, Omair Latif, DO .  aspirin EC tablet 81 mg, 81 mg, Oral, Daily, Raiford Noble [place], [place], 81 mg at 12/23/19 0949 .  bisacodyl (DULCOLAX) suppository 10 mg, 10 mg, Rectal, Daily PRN, Sheikh, Omair Latif, DO .  feeding supplement (ENSURE ENLIVE) (ENSURE ENLIVE) liquid 237 mL, 237 mL, Oral, TID BM, Sheikh, Omair Latif, DO, 237 mL at 12/23/19 0950 .  furosemide (LASIX) tablet 40 mg, 40 mg, Oral,  Daily, Sherren Mocha, MD, 40 mg at 12/23/19 0949 .  guaiFENesin (MUCINEX) 12 hr tablet 600 mg, 600 mg, Oral, BID, Raiford Noble [place], DO, 600 mg at 12/23/19 [id number] .  ipratropium-albuterol (DUONEB) 0.5-2.5 (3) MG/3ML nebulizer solution 3 mL, 3 mL, Nebulization, Q6H PRN, Sheikh, Omair Latif, DO .  levothyroxine (SYNTHROID) tablet 50 mcg, 50 mcg, Oral, Q0600, Raiford Noble [place], DO, 50 mcg at 12/23/19 0601 .  multivitamin with minerals tablet 1 tablet, 1 tablet, Oral, Daily, Raiford Noble [place], [place], 1 tablet at 12/23/19 0950 .  mycophenolate (CELLCEPT) capsule 250 mg, 250 mg, Oral, q morning - 10a, Raiford Noble [place], DO, 250 mg at 12/23/19 0950 .  mycophenolate (CELLCEPT) capsule 500 mg, 500 mg, Oral, QHS, Sheikh, Omair Latif, DO, 500 mg at 12/22/19 2145 .  nutrition supplement (JUVEN) (JUVEN) powder packet 1 packet, 1 packet, Oral, BID BM, Raiford Noble [place], [place], 1 packet at 12/23/19 [phone] .  ondansetron (ZOFRAN) tablet 4 mg, 4 mg, Oral, Q6H PRN **OR** ondansetron (ZOFRAN) injection 4 mg, 4 mg, Intravenous, Q6H PRN, Sheikh, Omair Latif, DO .  polyvinyl alcohol (LIQUIFILM TEARS) 1.4 % ophthalmic solution 1 drop, 1 drop, Both Eyes, PRN, Sheikh, Omair Latif, DO .  predniSONE (DELTASONE) tablet 20 mg, 20 mg, Oral, Q breakfast, Raiford Noble [place], DO, 20 mg at 12/23/19 [id number] .  pyridostigmine (MESTINON) tablet 30 mg, 30 mg, Oral, QID, Raiford Noble [place], DO, 30 mg at 12/23/19 [id number] .  rosuvastatin (CRESTOR) tablet 20 mg, 20 mg, Oral, Daily, Raiford Noble [place], DO, 20 mg at 12/23/19 0950 .  senna-docusate (Senokot-S) tablet 1 tablet, 1 tablet, Oral, QHS PRN, Sheikh, Omair Latif, DO  ROS: General ROS: negative for - chills, fatigue, fever, night sweats, weight gain or weight loss Psychological ROS: negative for - behavioral disorder, hallucinations, memory difficulties, mood swings or suicidal ideation Ophthalmic ROS: negative for - blurry vision, double vision, eye pain or loss of vision Endocrine  ROS: negative for - galactorrhea, hair pattern changes, polydipsia/polyuria or temperature intolerance Respiratory ROS: Positive for - shortness of breath  Cardiovascular ROS: negative for - chest pain, dyspnea on exertion, edema or irregular heartbeat Gastrointestinal ROS: negative for - abdominal pain, diarrhea, hematemesis, nausea/vomiting or stool incontinence Genito-Urinary ROS: negative for - dysuria, hematuria, incontinence or urinary frequency/urgency Musculoskeletal ROS: Positive for -  muscular weakness Neurological ROS: as noted in HPI Dermatological ROS: negative for rash and skin lesion changes  Exam: Current vital signs: BP 93/60 (BP Location: Left Arm)   Pulse 81   Temp 97.6 F (36.4 C) (Oral)   Resp 16   Ht 5\' 8"  (1.727 m)   Wt 53.5 kg   SpO2 98%   BMI 17.94 kg/m  Vital signs in last 24 hours: Temp:  [97.6 F (36.4 C)-97.8 F (36.6 C)] 97.6 F (36.4 C) (03/03 0945) Pulse Rate:  [76-81] 81 (03/03 0945) Resp:  [16-18] 16 (03/03 0945) BP: (93-125)/(53-62) 93/60 (03/03 0945) SpO2:  [97 %-100 %]  98 % (03/03 0945)   Constitutional: Malnourished.  Eyes: No scleral injection HENT: No OP obstrucion Head: Normocephalic.  Cardiovascular: Normal rate and regular rhythm.  Respiratory: Effort normal, non-labored breathing GI: Soft.  No distension. There is no tenderness.  Skin: WDI Neuro: Mental Status: Patient is awake, alert, oriented to hospital Speech-hypophonic but clear however minimal Cranial Nerves: II: Visual Fields are full.  III,IV, VI: EOMI without ptosis or diploplia. Pupils equal, round and reactive to light V: Facial sensation is symmetric to temperature VII: Facial movement is symmetric.  VIII: hearing decreased X: Palat elevates symmetrically XI: Shoulder shrug is symmetric. XII: tongue is midline without atrophy or fasciculations.  Motor: Tone is normal. Bulk is normal. 5/5 strength was present in upper extremities bilaterally with antigravity  with lower extremities bilaterally Sensory: Sensation is symmetric to light touch and temperature in the arms and legs. Deep Tendon Reflexes: Could not elicit knee jerk however he did have 1+ in the brachioradialis Plantars: Toes are downgoing bilaterally.  Labs I have reviewed labs in epic and the results pertinent to this consultation are:  CBC    Component Value Date/Time   WBC 11.1 (H) 12/23/2019 0446   RBC 3.83 (L) 12/23/2019 0446   HGB 12.2 (L) 12/23/2019 0446   HGB 17.0 11/16/2019 1220   HCT 38.2 (L) 12/23/2019 0446   HCT 51.1 (H) 11/16/2019 1220   PLT 66 (L) 12/23/2019 0446   PLT 141 (L) 11/16/2019 1220   MCV 99.7 12/23/2019 0446   MCV 93 11/16/2019 1220   MCH 31.9 12/23/2019 0446   MCHC 31.9 12/23/2019 0446   RDW 17.3 (H) 12/23/2019 0446   RDW 12.4 11/16/2019 1220   LYMPHSABS 0.5 (L) 12/23/2019 0446   LYMPHSABS 0.9 11/16/2019 1220   MONOABS 0.7 12/23/2019 0446   EOSABS 0.0 12/23/2019 0446   EOSABS 0.1 11/16/2019 1220   BASOSABS 0.0 12/23/2019 0446   BASOSABS 0.0 11/16/2019 1220    CMP     Component Value Date/Time   NA 146 (H) 12/23/2019 0446   NA 146 12/14/2019 0000   K 4.4 12/23/2019 0446   CL 110 12/23/2019 0446   CO2 23 12/23/2019 0446   GLUCOSE 116 (H) 12/23/2019 0446   BUN 117 (H) 12/23/2019 0446   BUN 41 (A) 12/14/2019 0000   CREATININE 2.89 (H) 12/23/2019 0446   CALCIUM 9.4 12/23/2019 0446   PROT 4.8 (L) 12/23/2019 0446   PROT 6.5 11/16/2019 1220   ALBUMIN 2.7 (L) 12/23/2019 0446   ALBUMIN 3.9 11/16/2019 1220   AST 23 12/23/2019 0446   ALT 46 (H) 12/23/2019 0446   ALKPHOS 88 12/23/2019 0446   BILITOT 0.9 12/23/2019 0446   BILITOT 0.8 11/16/2019 1220   GFRNONAA 19 (L) 12/23/2019 0446   GFRAA 21 (L) 12/23/2019 0446    Lipid Panel     Component Value Date/Time   CHOL 140 11/26/2017 0817   TRIG 129 11/26/2017 0817   HDL 46 11/26/2017 0817   CHOLHDL 3.0 11/26/2017 0817   LDLCALC 68 11/26/2017 0817     Imaging  Etta Quill  PA-C [facility] [phone]  M-F  (9:00 am- 5:00 PM)  12/23/2019, 11:51 AM    NEUROHOSPITALIST ADDENDUM Performed a face to face diagnostic evaluation.   I have reviewed the contents of history and physical exam as documented by PA/ARNP/Resident and agree with above documentation.  I have discussed and formulated the above plan as documented. Edits to the note have been made as needed.  Mr. Trent  Morrell Riley is a patient well-known to me-was previously admitted for myasthenia gravis exacerbation and received plasmapheresis.  He also has severe aortic stenosis and history of prostate cancer.  Patient was not compliant with his medications which was thought to be reason for exacerbation  He presents this time with shortness of breath-patient received 1 L bolus and started on antibiotics for IV ceftriaxone for suspected pneumonia.  Also noted to have acute on chronic systolic heart failure as well as bilateral pleural effusions, currently being treated for that.  Has leukocytosis and lactic acidosis.  Requested not to use fluoroquinolones or azithromycin as this can exacerbate myasthenia.  Neurology consult today as patient still complaining of fatigue.  NIF -30. On my examination, patient does not have any ptosis, difficulty maintaining upward gaze.  No significant weakness in neck flexors.  Single breath count 20.  Mild hypophonia.  Possible myasthenia gravis exacerbation versus fatigability from underlying medical conditions (aortic stenosis, pneumonia, acute on chronic heart failure)    Recommendations -I am not certain patient's increased fatigue is due to myasthenia gravis exacerbation.  Patient has multiple medical conditions including severe aortic stenosis, acute on chronic heart failure as well as bilateral pleural effusions, as well as infection.  I would continue to monitor and treat the above conditions.  I would continue home myasthenia gravis medications including  CellCept, prednisone, Mestinon.  If patient continues to be extremely fatigable, or if negative inspiratory force becomes less than -20, I would start patient on IVIG.  -Continue to monitor NIF and forced vital capacity every 8 hour.  If NIF is less than -20  please notify neurology. -Avoid antibiotics that can worsen myasthenia gravis -Continue prednisone, CellCept as well as Mestinon -Frequent neurochecks  Neurology will continue to follow closely.  Karena Addison Charmine Bockrath MD [facility] [phone]   If 7pm to 7am, please call on call as listed on AMION.

## 2019-12-23 NOTE — Consult Note (Signed)
Consultation Note Date: 12/23/2019   Patient Name: Jason Riley  DOB: [birth year]  MRN: [id number]  Age / Sex: 86 y.o., male  PCP: [name], MD Referring Physician: Eugenie Filler, MD  Reason for Consultation: Establishing goals of care  HPI/Patient Profile: 86 y.o. male  with past medical history of severe aortic stenosis, prostate cancer s/p prostatectomy, myasthenia gravis, HLD, and hypothyroidism admitted on 01/13/2020 with weakness and dyspnea. Poor PO intake and decline in function since hospitalization a couple of weeks ago. Also found to have AKI. Patient is not a surgical candidate for valve replacement. CT chest revealed pulmonary edema and bilateral pleural effusions. Patient comes from independent living facility. Throughout hospitalization he remains weak with very poor PO intake. Receiving boluses d/t hypotension. Now with frequent dark liquid stool. PMT consulted for [place]. PMT consulted for [place].  Clinical Assessment and Goals of Care: I have reviewed medical records including EPIC notes, labs and imaging, received report from RN, assessed the patient and then met with patient and niece  Jason Riley to discuss diagnosis prognosis, GOC, EOL wishes, disposition and options.  I introduced Palliative Medicine as specialized medical care for people living with serious illness. It focuses on providing relief from the symptoms and stress of a serious illness. The goal is to improve quality of life for both the patient and the family.  I attempted to have conversation with patient independently; however, it became clear he could not fully grasp conversation - somewhat confused and very hard of hearing. He tells me multiple times during the conversation that his niece, Jason Riley, is his Media planner.  Met with Jason Riley - she shows me patient's HCPOA documents naming her as HCPOA and her spouse Milta Deiters secondary. She also shows me Mr. Coad living will  stating a desire for a natural death.   We discussed a brief life review of the patient. She tells me that Mr. Asfaw has been living at [facility] - he was doing fairly well until his hospitalization a couple of weeks ago. He has declined since that time - very weak and very poor PO intake. Some decline in mental status as well.    We discussed his current illness and what it means in the larger context of his on-going co-morbidities.  Natural disease trajectory and expectations at EOL were discussed. Specifically discussed his critical aortic stenosis and overall failure to thrive.   I attempted to elicit values and goals of care important to the patient.  She tells me being with family would be most important to Mr. Norwood if he were nearing end of life.   The difference between aggressive medical intervention and comfort care was considered in light of the patient's goals of care. Mr. Sookdeo would not be interested in full code measures - Fara requests code status be changed to DNR and Mr. Magri agrees.   Hospice and Palliative Care services outpatient were explained and offered. Discussed philosophy of hospice care and why this may be appropriate for Mr. Groeneveld. Jason Riley is very interested in hospice services but her concern is where he can receive these services. She plans to speak to her husband to see if bringing him home to their house is an option. She is very interested in hospice facility - discussed we could reevaluate tomorrow to see if that is an option.   Questions and concerns were addressed. The family was encouraged to call with questions or concerns.   Primary Decision Maker HCPOA - niece Jason Riley -  viewed legal documentation of [place]   - code status changed to DNR - niece interested in hospice care, unsure where he can receive this care - will f/u with [facility], may become appropriate for hospice facility depending on hospital course  Code Status/Advance Care  Planning:  DNR   Symptom Management:   Denies pain, shortness of breath, nausea - only complaint is diarrhea being worked up by primary  Prognosis:   Poor prognosis r/t aortic stenosis and failure to thrive  Discharge Planning: To Be Determined      Primary Diagnoses: Present on Admission: . Dyspnea on exertion . Protein-calorie malnutrition, severe . Presbycusis of both ears . Hypothyroid . Hyperlipidemia . CKD (chronic kidney disease), stage III ([place]) . Aortic stenosis with mitral and aortic insufficiency . AKI (acute kidney injury) ([place])   I have reviewed the medical record, interviewed the patient and family, and examined the patient. The following aspects are pertinent.  Past Medical History:  Diagnosis Date  . CN (constipation)   . Elevated transaminase level   . History of measles   . Hypertension   . Hypothyroidism   . Myasthenia gravis ([place])   . Myasthenia gravis ([place])   . Prostate cancer [facility])    Prostatectomy 2001  . Severe aortic stenosis    Social History   Socioeconomic History  . Marital status: Widowed    Spouse name: Not on file  . Number of children: 1  . Years of education: Some Coll  . Highest education level: Some college, no degree  Occupational History  . Occupation: Retired  Tobacco Use  . Smoking status: Former Smoker    Years: 15.00    Types: Cigars  . Smokeless tobacco: Current User    Types: Chew  . Tobacco comment: quit over 30 years ago  Substance and Sexual Activity  . Alcohol use: No    Alcohol/week: 0.0 standard drinks  . Drug use: No  . Sexual activity: Not Currently  Other Topics Concern  . Not on file  Social History Narrative  . Not on file   Social Determinants of Health   Financial Resource Strain:   . Difficulty of Paying Living Expenses: Not on file  Food Insecurity:   . Worried About Charity fundraiser in the Last Year: Not on file  . Ran Out of Food in the Last Year: Not on file  Transportation  Needs:   . Lack of Transportation (Medical): Not on file  . Lack of Transportation (Non-Medical): Not on file  Physical Activity:   . Days of Exercise per Week: Not on file  . Minutes of Exercise per Session: Not on file  Stress:   . Feeling of Stress : Not on file  Social Connections:   . Frequency of Communication with Friends and Family: Not on file  . Frequency of Social Gatherings with Friends and Family: Not on file  . Attends Religious Services: Not on file  . Active Member of Clubs or Organizations: Not on file  . Attends Archivist Meetings: Not on file  . Marital Status: Not on file   Family History  Problem Relation Age of Onset  . Heart disease Mother   . Colon cancer Father   . Obesity Sister    Scheduled Meds: . aspirin EC  81 mg Oral Daily  . feeding supplement (ENSURE ENLIVE)  237 mL Oral TID BM  . furosemide  40 mg Oral Daily  .  guaiFENesin  600 mg Oral BID  . levothyroxine  50 mcg Oral Q0600  . multivitamin with minerals  1 tablet Oral Daily  . mycophenolate  250 mg Oral q morning - 10a  . mycophenolate  500 mg Oral QHS  . nutrition supplement (JUVEN)  1 packet Oral BID BM  . predniSONE  20 mg Oral Q breakfast  . pyridostigmine  30 mg Oral QID  . rosuvastatin  20 mg Oral Daily   Continuous Infusions: PRN Meds:.acetaminophen **OR** acetaminophen, bisacodyl, ipratropium-albuterol, ondansetron **OR** ondansetron (ZOFRAN) IV, polyvinyl alcohol, senna-docusate Allergies  Allergen Reactions  . Atorvastatin Other (See Comments)    Elevated liver functions ("liver disorder")  . Other Other (See Comments)    PATIENT HAS NO DENTURES- PREPARED FOOD ACCORDINGLY  . Sulfa Antibiotics Rash   Review of Systems  Constitutional: Positive for activity change, appetite change and fatigue.  Gastrointestinal: Positive for diarrhea.  Neurological: Positive for weakness.    Physical Exam Constitutional:      Appearance: He is ill-appearing.     Comments:  frail  HENT:     Head: Normocephalic and atraumatic.  Cardiovascular:     Rate and Rhythm: Normal rate.  Pulmonary:     Effort: Tachypnea present.  Abdominal:     Comments: Dark liquid BMs  Neurological:     Mental Status: He is alert.     Comments: Slightly confused, falls asleep during conversation     Vital Signs: BP (!) 85/43 (BP Location: Left Arm)   Pulse 84   Temp (!) 97.5 F (36.4 C) (Oral)   Resp (!) 32   Ht _0  (1.727 m)   Wt 53.5 kg   SpO2 95%   BMI 17.94 kg/m  Pain Scale: 0-10   Pain Score: 0-No pain   SpO2: SpO2: 95 % O2 Device:SpO2: 95 % O2 Flow Rate: .O2 Flow Rate (L/min): 2 L/min  IO: Intake/output summary:   Intake/Output Summary (Last 24 hours) at 12/23/2019 1659 Last data filed at 12/23/2019 1500 Gross per 24 hour  Intake 650 ml  Output 2050 ml  Net -1400 ml    LBM: Last BM Date: 12/22/19 Baseline Weight: Weight: 53.5 kg Most recent weight: Weight: 53.5 kg     Palliative Assessment/Data: PPS 20% d/t PO intake    Time Total: 70 minutes Greater than 50%  of this time was spent counseling and coordinating care related to the above assessment and plan.  Juel Burrow, DNP, AGNP-C Palliative Medicine Team [phone] Pager: [phone]

## 2019-12-23 NOTE — Progress Notes (Signed)
NIF= -20  VC= 1.10L SpO2 100% on  2L [place]  Pt had difficulty with NIF. Pt blew out when I encouraged him to suck in. Pt finally able to achieve -20.

## 2019-12-23 NOTE — Progress Notes (Signed)
Progress Note  Patient Name: Jason Riley Date of Encounter: 12/23/2019  Primary Cardiologist: No primary care provider on file.   Subjective   Patient denies chest pain.  Shortness of breath is stable and he feels pretty good in that regard.  Biggest concern at present is need for a bowel movement.  Inpatient Medications    Scheduled Meds: . aspirin EC  81 mg Oral Daily  . enoxaparin (LOVENOX) injection  30 mg Subcutaneous Q24H  . feeding supplement (ENSURE ENLIVE)  237 mL Oral TID BM  . guaiFENesin  600 mg Oral BID  . levothyroxine  50 mcg Oral Q0600  . multivitamin with minerals  1 tablet Oral Daily  . mycophenolate  250 mg Oral q morning - 10a  . mycophenolate  500 mg Oral QHS  . nutrition supplement (JUVEN)  1 packet Oral BID BM  . predniSONE  20 mg Oral Q breakfast  . pyridostigmine  30 mg Oral QID  . rosuvastatin  20 mg Oral Daily   Continuous Infusions:  PRN Meds: acetaminophen **OR** acetaminophen, bisacodyl, ipratropium-albuterol, ondansetron **OR** ondansetron (ZOFRAN) IV, polyvinyl alcohol, senna-docusate   Vital Signs    Vitals:   12/22/19 0600 12/22/19 2210 12/23/19 0552 12/23/19 0735  BP: 118/68 125/62 (!) 112/53   Pulse: 88 77 78 76  Resp: 20 16 18 16   Temp: (!) 97.5 F (36.4 C) 97.8 F (36.6 C) 97.8 F (36.6 C)   TempSrc: Oral Oral Oral   SpO2: 99% 99% 100% 97%  Weight:      Height:        Intake/Output Summary (Last 24 hours) at 12/23/2019 0859 Last data filed at 12/23/2019 0738 Gross per 24 hour  Intake 580 ml  Output 2900 ml  Net -2320 ml   Last 3 Weights 01/06/2020 12/04/2019 12/03/2019  Weight (lbs) 118 lb 132 lb 7.9 oz 131 lb 9.8 oz  Weight (kg) 53.524 kg 60.1 kg 59.7 kg      Telemetry    Sinus rhythm, PACs - Personally Reviewed   Physical Exam  Frail, elderly male in no acute distress GEN: No acute distress.   Neck: No JVD Cardiac:  Distant heart sounds, regular rate and rhythm, 3/6 harsh late peaking systolic murmur at the right  upper sternal border Respiratory: Clear to auscultation bilaterally. GI: Soft, nontender, non-distended  MS: No edema; No deformity. Neuro:  Nonfocal  Psych: Normal affect   Labs    High Sensitivity Troponin:   Recent Labs  Lab 01/10/2020 1619 12/22/2019 2203  TROPONINIHS 237* 255*      Chemistry Recent Labs  Lab 12/29/2019 1313 01/09/2020 1313 01/03/2020 2203 12/22/19 0421 12/23/19 0446  NA 141  --   --  146* 146*  K 5.0  --   --  4.5 4.4  CL 107  --   --  110 110  CO2 21*  --   --  21* 23  GLUCOSE 119*  --   --  97 116*  BUN 91*  --   --  91* 117*  CREATININE 3.16*   < > 3.06* 3.06* 2.89*  CALCIUM 9.9  --   --  9.6 9.4  PROT  --   --   --  5.3* 4.8*  ALBUMIN  --   --   --  3.4* 2.7*  AST  --   --   --  29 23  ALT  --   --   --  60* 46*  ALKPHOS  --   --   --  117 88  BILITOT  --   --   --  1.0 0.9  GFRNONAA 17*   < > 17* 17* 19*  GFRAA 19*   < > 20* 20* 21*  ANIONGAP 13  --   --  15 13   < > = values in this interval not displayed.     Hematology Recent Labs  Lab 12/23/2019 2203 12/22/19 0421 12/23/19 0446  WBC 12.3* 11.8* 11.1*  RBC 4.35 4.20* 3.83*  HGB 13.7 13.4 12.2*  HCT 42.2 41.3 38.2*  MCV 97.0 98.3 99.7  MCH 31.5 31.9 31.9  MCHC 32.5 32.4 31.9  RDW 17.5* 17.4* 17.3*  PLT 73* 72* 66*    BNP Recent Labs  Lab 12/30/2019 1755  BNP >4,500.0*     DDimer  Recent Labs  Lab 01/11/2020 2203 12/22/19 0858  DDIMER 7.62* 8.49*     Radiology    DG Chest 2 View  Result Date: 12/22/2019 CLINICAL DATA:  86 year old male with shortness of breath. My as the knee a gravis. EXAM: CHEST - 2 VIEW COMPARISON:  Chest CT and radiographs yesterday, and earlier. FINDINGS: Portable AP semi upright view at 0704 hours. Mediastinal contours remain normal. Veiling opacity in both lower lungs has increased from the portable chest yesterday. Superimposed pulmonary vascular congestion. No pneumothorax. No air bronchograms identified. No acute osseous abnormality identified.  IMPRESSION: Bilateral pleural effusions and pulmonary interstitial edema. Pleural fluid volume may be mildly increased since yesterday. Electronically Signed   By: Genevie Ann M.D.   On: 12/22/2019 08:59   CT CHEST WO CONTRAST  Result Date: 01/11/2020 CLINICAL DATA:  Pneumonia.  Myasthenia gravis. EXAM: CT CHEST WITHOUT CONTRAST TECHNIQUE: Multidetector CT imaging of the chest was performed following the standard protocol without IV contrast. COMPARISON:  Chest radiograph dated 12/26/2019. CT chest dated 04/16/2018 FINDINGS: Cardiovascular: Heart is normal in size.  No pericardial effusion. No evidence of thoracic aortic aneurysm. Dense calcifications of the aortic root/valve. Atherosclerotic calcifications of the aortic arch. Coronary atherosclerosis of the LAD and left circumflex. Mediastinum/Nodes: No suspicious medial lymphadenopathy. Visualized thyroid is unremarkable. Lungs/Pleura: Mild scattered ground-glass opacities in the lungs bilaterally, including in the bilateral suprahilar/perihilar regions, favoring mild interstitial edema over infection. Moderate bilateral pleural effusions. Associated mild lower lobe compressive atelectasis. Scattered peribronchovascular hyperdensities in the right middle and lower lobes favors sequela of prior aspiration mild biapical pleural-parenchymal scarring. No suspicious pulmonary nodules. No pneumothorax. Upper Abdomen: Visualized upper abdomen is notable for vascular calcifications, a 2.3 cm left upper pole renal cyst, and a 4 mm nonobstructing left renal calculus (series 3/image 168). Musculoskeletal: Degenerative changes of the visualized thoracolumbar spine. IMPRESSION: Suspected mild interstitial edema. Moderate bilateral pleural effusions. Associated mild lower lobe compressive atelectasis. No findings suspicious for pneumonia. Dense calcifications of the aortic root/valve in this patient with known aortic stenosis. Aortic Atherosclerosis (ICD10-I70.0). Electronically  Signed   By: Julian Hy M.D.   On: 01/19/2020 17:51   US RENAL  Result Date: 01/18/2020 CLINICAL DATA:  Acute renal injury EXAM: RENAL / URINARY TRACT ULTRASOUND COMPLETE COMPARISON:  None. FINDINGS: Right Kidney: Renal measurements: 9.2 x 4.3 x 4.1 cm. = volume: 84 mL. Cysts are again identified the largest of which lies in the upper pole measuring 1.9 cm. Diffuse increased echogenicity is noted. Left Kidney: Renal measurements: 7.7 x 4.0 x 3.7 cm. = volume: 59 mL. Only limited evaluation of the left kidney is noted due to overlying bowel gas. Bladder: Not well visualized Other: None. IMPRESSION: Limited  exam due to poor visualization of the left kidney and urinary bladder. Right renal cysts stable from the prior exam. Electronically Signed   By: Inez Catalina M.D.   On: 12/23/2019 23:59   DG Chest Portable 1 View  Result Date: 12/25/2019 CLINICAL DATA:  Shortness of breath. EXAM: PORTABLE CHEST 1 VIEW COMPARISON:  Chest radiograph 11/24/2019 FINDINGS: Mild cardiomegaly, unchanged. Aortic atherosclerosis. Central pulmonary vascular congestion has increased from prior examination. Gradient opacity at the right greater than left lung bases likely reflecting layering pleural effusions. There are superimposed interstitial and ill-defined airspace opacities with a mid to lower lung predominance likely reflecting edema. No evidence of pneumothorax. Thoracic spondylosis. No acute bony abnormality. Overlying cardiac monitoring leads. In opacity projects over the medial right lung apex and superior mediastinum likely reflecting a skin fold. IMPRESSION: entral pulmonary vascular congestion has increased from prior exam 11/24/2019. Also increased from prior exam, there are bilateral interstitial and ill-defined airspace opacities with a mid to lower lung predominance, favored to reflect edema. Infection cannot be definitively excluded. Layering bilateral pleural effusions. Mild cardiomegaly with aortic  atherosclerosis. Electronically Signed   By: Kellie Simmering DO   On: 12/23/2019 16:34   IR US CHEST  Result Date: 12/22/2019 CLINICAL DATA:  86 year old male referred for thoracentesis EXAM: CHEST ULTRASOUND COMPARISON:  None. FINDINGS: Scant pleural fluid. IMPRESSION: Scant pleural fluid on ultrasound without safe window for thoracentesis. Electronically Signed   By: Corrie Mckusick D.O.   On: 12/22/2019 16:35    Cardiac Studies   2D Echo: IMPRESSIONS    1. The aortic valve is abnormal. Aortic valve regurgitation is mild to  moderate. Very severe aortic valve stenosis. Aortic valve mean gradient  measures 60.0 mmHg. Valve area calculation may be inaccurate due to  suboptimal LVOT doppler. Consider  cardiology consultation for very severe aortic valve stenosis.  2. The mitral valve is abnormal in structure. At least moderate,  eccentric mitral valve regurgitation. No evidence of mitral stenosis.  3. The tricuspid valve is normal in structure. Tricuspid valve  regurgitation is moderate-severe.  4. Left ventricular ejection fraction, by visual estimation, is 55 to  60%. The left ventricle has normal function. There is mildly increased  left ventricular hypertrophy.  5. Elevated left ventricular end-diastolic pressure.  6. Left ventricular diastolic parameters are consistent with Grade II  diastolic dysfunction (pseudonormalization).  7. Global right ventricle has normal systolic function.The right  ventricular size is normal. No increase in right ventricular wall  thickness.  8. Moderately elevated pulmonary artery systolic pressure.  9. Left atrial size was moderately dilated.  10. Right atrial size was mildly dilated.  11. Moderate mitral annular calcification.  12. There is severe calcifcation of the aortic valve.  13. The pulmonic valve was normal in structure. Pulmonic valve  regurgitation is trivial.  14. The inferior vena cava is normal in size with greater than 50%    respiratory variability, suggesting right atrial pressure of 3 mmHg.  15. The interatrial septum was not well visualized.   Patient Profile     86 y.o. male with severe aortic stenosis who was admitted with exacerbation of myasthenia gravis, dysphagia, and progressive weakness.  Also found to have acute on chronic diastolic heart failure with critical aortic stenosis  Assessment & Plan    1.  Critical aortic stenosis 2.  Acute kidney injury on background of stage III chronic kidney disease 3.  Severe protein calorie malnutrition 4.  Failure to thrive 5.  Myasthenia gravis  I reviewed Dr. Camillia Herter consultation note from yesterday and completely agree with his assessment.  This patient is very frail and I think he is too sick to even consider any kind of invasive cardiac evaluation or treatment.  Agree with palliative care consultation with focus on goals of care.  Would add furosemide 40 mg daily, but probably would not continue with any more IV diuresis as this patient will be at high risk of AKI and hemodynamic problems with his kidney disease and critical aortic stenosis.  [facility] will sign off.   Medication Recommendations:  See orders Other recommendations (labs, testing, etc):  none Follow up as an outpatient:  Dr Clayborn Bigness  For questions or updates, please contact [facility] Please consult [email or web address] for contact info under        Signed, Sherren Mocha, MD  12/23/2019, 8:59 AM

## 2019-12-23 NOTE — Progress Notes (Signed)
Patients manual BP 88/34. RN notified on call NP, Sharlet Salina. Awaiting orders or call back.

## 2019-12-23 NOTE — Progress Notes (Addendum)
PROGRESS NOTE    Jason Riley  KCL:[id number] DOB: Dec 04, 1928 DOA: 12/29/2019 PCP: [name], MD    Brief Narrative:  Jason Oravec Bundyis a 86 y.o.malewith medical history significantfor but not limited to severe aortic stenosis, history of prostate cancer status post prostatectomy, myasthenia gravis, hyperlipidemia, hypothyroidism as well as other comorbidities who presented with a chief complaint of generalized weakness and dyspnea on exertion for the last week or so which is progressively gotten worse. Per family he has not had proper oral intake and has not been as active. Family noticed a marked decline in his oral intake and patient gets short of breath with minimal activity now. He denied any chest pain, lightheadedness but family does admit that he has a dry hacking cough. Of note patient was recently hospitalized 2 weeks ago for a myasthenia gravis exacerbation and had to have plasmapheresis and evaluated by neurology. Over the last week or so he has not been feeling well. [facility] was asked to admit this patient for his generalized weakness and dyspnea on exertion as well as a new onset AKI on CKD stage III.  ED Course:In the ED had basic blood work done as well as a chest x-ray and EKG. He was given 1 L bolus and started on antibiotics with IV ceftriaxone for suspected pneumonia.Of note Covid testing was still pending  **Interim History Cardiology consulted and patient was given some diuresis.  Renal function mildly improved however patient remains severely deconditioned.  He does have critical aortic stenosis and cardiology feels that he is not a surgical candidate or a candidate for TAVR.  Palliative care consulted for further goals.  We will continue diuresis and encourage p.o. intake.   Assessment & Plan:   Active Problems:   Aortic stenosis with mitral and aortic insufficiency   CKD (chronic kidney disease), stage III (HCC)   Hyperlipidemia   Hypothyroid   History of  prostate cancer   AKI (acute kidney injury) ([facility])   Severe aortic stenosis   Weakness generalized   Presbycusis of both ears   Protein-calorie malnutrition, severe   Dyspnea on exertion   Pressure injury of skin  1 dyspnea on exertion/acute respiratory failure with hypoxia in the setting of chronic diastolic failure/critical aortic stenosis Initially felt secondary to pneumonia on presentation however that has been ruled out and antibiotics discontinued.  COVID-19 PCR negative.  Chest x-ray done concerning for pulmonary vascular congestion, bilateral layering pleural effusions.  Patient initially placed on IV fluids due to concern for volume depletion however this was discontinued after CT scan obtained.  CT chest with mild interstitial edema, moderate bilateral pleural effusions, mild lower lobe compressive atelectasis no findings of pneumonia.  Patient was on IV Lasix with urine output of 2.9 L over the past 24 hours.  Patient is -3.220 L.  Patient with recent 2D echo and is-we will not repeat.  2D echo from 11/25/2019 with abnormal aortic valve with very severe aortic valve stenosis with a valve mean gradient of 60 mmHg.  Moderate mitral valve regurgitation.  Moderate to severe tricuspid valvular regurgitation.  EF 55 to 60%.  Mild LVH.  Grade 2 diastolic dysfunction.  Moderately elevated pulmonary artery systolic pressure.  Patient assessed by cardiology and deemed not a candidate for any kind of invasive cardiac evaluation or treatment as patient noted to be very frail.  IV Lasix transition to oral Lasix 40 mg daily per cardiology.  Per cardiology no further IV diuresis as patient will be at  high risk for AKI and hemodynamic problems with his kidney disease and critical aortic stenosis.  Patient with history of myasthenia gravis with recent treatment and had presented with shortness of breath and generalized weakness.  Will have neurology reevaluate.  Palliative care consultation pending.  Supportive  care.  2.  Generalized weakness/poor oral intake Patient with history of myasthenia gravis.  Patient recently treated for myasthenia gravis flare.  Patient presented with generalized weakness poor oral intake.  TSH within normal limits.  Urinalysis negative for UTI.  Urine cultures pending.  Blood cultures pending with no growth to date.  Patient was being diuresed secondary to problem #1.  Patient with loose stools.  Check a C. difficile PCR.  Consult with neurology for further evaluation and management due to concerns for possible myasthenia gravis flare.  Follow.  3.  Acute kidney injury on chronic kidney disease stage IIIb/metabolic acidosis Patient recently discharged with a creatinine baseline of 1.7.  Creatinine on admission was 3.6 and felt likely secondary to a prerenal azotemia in the setting of decreased oral intake and dehydration.  Patient was on IV fluids which have subsequently been discontinued.  Renal ultrasound done negative for hydronephrosis.  Creatinine down to 2.89.  Patient was on IV Lasix and will transition to oral Lasix.  Follow.  4.  Diarrhea Patient with multiple loose stools this morning.  Patient did complain of not having any bowel movement however per RN NST patient did have large loose stools.  Check a C. difficile PCR.  Follow.  5.  Severe protein calorie malnutrition/ftt Continue nutritional supplementation.  Palliative care consultation pending follow.  6.  Myasthenia gravis Patient with a recent admission for myasthenia gravis flare and recently had plasmapheresis.  Patient with a dysphagia 3 diet.  Patient does state he was compliant with his medications however there is some concern for medication noncompliance.  Continue current dysphagia 3 diet.  Continue current regimen of mycophenolate, pyridostigmine, prednisone.  Patient with a NIF of -30.  Due to patient's presentation with generalized weakness and poor oral intake consult with neurology for further  evaluation and management.  7.  Hypothyroidism TSH of 2.970.  Continue home dose Synthroid.  8.  Hyperlipidemia Continue statin.  9.  Leukocytosis Patient with a mild leukocytosis.  Urine cultures pending.  Blood cultures with no growth to date.  Patient with multiple loose stools and as such we will check a C. difficile PCR.  Leukocytosis slightly trended down.  Follow.  10.  Thrombocytopenia Platelet count worsening.  Patient with no overt bleeding.  Platelet count currently at 66 from 72 from 73 from 83 from 139(12/14/2019).  Discontinue Lovenox.  Follow.   DVT prophylaxis: Discontinue Lovenox due to thrombocytopenia, SCDs. Code Status: DNR Family Communication: Updated patient.  Updated niece at bedside.  Disposition Plan:  . Patient came from: Independent living facility           . Anticipated d/c place: To be determined. . Barriers to d/c OR conditions which need to be met to effect a safe d/c: To be determined.  Likely home when clinically improved, cleared by neurology, pending palliative care evaluation and recommendations.   Consultants:   Neurology pending  Cardiology: Dr. Angelena Form 12/22/2019  Palliative care pending  Procedures:  CT chest 01/02/2020  Chest x-ray 01/19/2020, 12/22/2019  Renal ultrasound 01/02/2020  Antimicrobials:   IV Rocephin 01/03/2020>>>> 12/22/2019     Subjective: Patient denies any significant shortness of breath however stated has not been able to ambulate.  Denies any chest pain.  States every time he tries to pee feels like he needs to have a bowel movement however only has a smear and complains of constipation and states has not had a bowel movement in about 4 to 5 days.  Per RN patient with medium sized 2 loose stools this morning.  Patient does state he has been compliant with his medications.  Objective: Vitals:   12/22/19 2210 12/23/19 0552 12/23/19 0735 12/23/19 0945  BP: 125/62 (!) 112/53  93/60  Pulse: 77 78 76 81  Resp: _0 Temp: 97.8 F (36.6 C) 97.8 F (36.6 C)  97.6 F (36.4 C)  TempSrc: Oral Oral  Oral  SpO2: 99% 100% 97% 98%  Weight:      Height:        Intake/Output Summary (Last 24 hours) at 12/23/2019 1228 Last data filed at 12/23/2019 6333 Gross per 24 hour  Intake 580 ml  Output 3650 ml  Net -3070 ml   Filed Weights   01/10/2020 1300  Weight: 53.5 kg    Examination:  General exam: Appears calm and comfortable  Respiratory system: Clear to auscultation. Respiratory effort normal. Cardiovascular system: RRR 3/6 harsh systolic murmur right upper sternal border. No JVD, murmurs, rubs, gallops or clicks. No pedal edema. Gastrointestinal system: Abdomen is nondistended, soft and nontender. No organomegaly or masses felt. Normal bowel sounds heard. Central nervous system: Alert and oriented. No focal neurological deficits. Extremities: Symmetric 5 x 5 power. Skin: No rashes, lesions or ulcers Psychiatry: Judgement and insight appear normal. Mood & affect appropriate.     Data Reviewed: I have personally reviewed following labs and imaging studies  CBC: Recent Labs  Lab 01/15/2020 1313 12/22/2019 2203 12/22/19 0421 12/23/19 0446  WBC 14.8* 12.3* 11.8* 11.1*  NEUTROABS  --   --  10.5* 9.9*  HGB 13.8 13.7 13.4 12.2*  HCT 44.2 42.2 41.3 38.2*  MCV 98.9 97.0 98.3 99.7  PLT 83* 73* 72* 66*   Basic Metabolic Panel: Recent Labs  Lab 01/01/2020 1313 12/27/2019 2203 12/22/19 0421 12/23/19 0446  NA 141  --  146* 146*  K 5.0  --  4.5 4.4  CL 107  --  110 110  CO2 21*  --  21* 23  GLUCOSE 119*  --  97 116*  BUN 91*  --  91* 117*  CREATININE 3.16* 3.06* 3.06* 2.89*  CALCIUM 9.9  --  9.6 9.4  MG  --   --   --  2.7*  PHOS  --   --   --  4.4   GFR: Estimated Creatinine Clearance: 13.4 mL/min (A) (by C-G formula based on SCr of 2.89 mg/dL (H)). Liver Function Tests: Recent Labs  Lab 12/22/19 0421 12/23/19 0446  AST 29 23  ALT 60* 46*  ALKPHOS 117 88  BILITOT 1.0 0.9  PROT 5.3*  4.8*  ALBUMIN 3.4* 2.7*   No results for input(s): LIPASE, AMYLASE in the last 168 hours. No results for input(s): AMMONIA in the last 168 hours. Coagulation Profile: No results for input(s): INR, PROTIME in the last 168 hours. Cardiac Enzymes: No results for input(s): CKTOTAL, CKMB, CKMBINDEX, TROPONINI in the last 168 hours. BNP (last 3 results) No results for input(s): PROBNP in the last 8760 hours. HbA1C: No results for input(s): HGBA1C in the last 72 hours. CBG: Recent Labs  Lab 12/27/2019 1926 12/23/19 0718  GLUCAP 101* 122*   Lipid Profile: No results for input(s): CHOL, HDL,  LDLCALC, TRIG, CHOLHDL, LDLDIRECT in the last 72 hours. Thyroid Function Tests: Recent Labs    12/22/2019 2203  TSH 2.970   Anemia Panel: Recent Labs    01/13/2020 2203  FERRITIN 242   Sepsis Labs: Recent Labs  Lab 01/03/2020 1624 12/22/2019 1818  LATICACIDVEN 2.3* 1.8    Recent Results (from the past 240 hour(s))  Blood culture (routine x 2)     Status: None (Preliminary result)   Collection Time: 01/03/2020  4:24 PM   Specimen: BLOOD  Result Value Ref Range Status   Specimen Description BLOOD RIGHT ANTECUBITAL  Final   Special Requests   Final    BOTTLES DRAWN AEROBIC AND ANAEROBIC Blood Culture adequate volume Performed at [facility], [place] [address] [place], [place] [address]    Culture NO GROWTH < 24 HOURS  Final   Report Status PENDING  Incomplete  Blood culture (routine x 2)     Status: None (Preliminary result)   Collection Time: 01/18/2020  6:08 PM   Specimen: BLOOD  Result Value Ref Range Status   Specimen Description BLOOD LEFT ANTECUBITAL  Final   Special Requests   Final    BOTTLES DRAWN AEROBIC AND ANAEROBIC Blood Culture adequate volume Performed at [facility], [place] [address] [place], [place] [address]    Culture NO GROWTH < 24 HOURS  Final   Report Status PENDING  Incomplete  SARS CORONAVIRUS 2 (TAT 6-24 HRS) Nasopharyngeal Nasopharyngeal Swab     Status:  None   Collection Time: 01/09/2020  6:24 PM   Specimen: Nasopharyngeal Swab  Result Value Ref Range Status   SARS Coronavirus 2 NEGATIVE NEGATIVE Final    Comment: (NOTE) SARS-CoV-2 target nucleic acids are NOT DETECTED. The SARS-CoV-2 RNA is generally detectable in upper and lower respiratory specimens during the acute phase of infection. Negative results do not preclude SARS-CoV-2 infection, do not rule out co-infections with other pathogens, and should not be used as the sole basis for treatment or other patient management decisions. Negative results must be combined with clinical observations, patient history, and epidemiological information. The expected result is Negative. Fact Sheet for Patients: SugarRoll.be Fact Sheet for Healthcare Providers: https://www.woods-mathews.com/ This test is not yet approved or cleared by the [place] FDA and  has been authorized for detection and/or diagnosis of SARS-CoV-2 by FDA under an Emergency Use Authorization (EUA). This EUA will remain  in effect (meaning this test can be used) for the duration of the COVID-19 declaration under Section 56 4(b)(1) of the Act, 21 U.S.C. section 360bbb-3(b)(1), unless the authorization is terminated or revoked sooner. Performed at [facility], [place] [address] [place], [place] [address]   Urine culture     Status: Abnormal (Preliminary result)   Collection Time: 01/19/2020  7:30 PM   Specimen: Urine, Random  Result Value Ref Range Status   Specimen Description URINE, RANDOM  Final   Special Requests NONE  Final   Culture (A)  Final    80,000 COLONIES/mL GRAM NEGATIVE RODS SUSCEPTIBILITIES TO FOLLOW Performed at [facility], [place] [address], [place], [place] [address]    Report Status PENDING  Incomplete         Radiology Studies: DG Chest 2 View  Result Date: 12/22/2019 CLINICAL DATA:  86 year old male with shortness of breath. My as the  knee a gravis. EXAM: CHEST - 2 VIEW COMPARISON:  Chest CT and radiographs yesterday, and earlier. FINDINGS: Portable AP semi upright view  at 0704 hours. Mediastinal contours remain normal. Veiling opacity in both lower lungs has increased from the portable chest yesterday. Superimposed pulmonary vascular congestion. No pneumothorax. No air bronchograms identified. No acute osseous abnormality identified. IMPRESSION: Bilateral pleural effusions and pulmonary interstitial edema. Pleural fluid volume may be mildly increased since yesterday. Electronically Signed   By: Genevie Ann M.D.   On: 12/22/2019 08:59   CT CHEST WO CONTRAST  Result Date: 12/23/2019 CLINICAL DATA:  Pneumonia.  Myasthenia gravis. EXAM: CT CHEST WITHOUT CONTRAST TECHNIQUE: Multidetector CT imaging of the chest was performed following the standard protocol without IV contrast. COMPARISON:  Chest radiograph dated 01/01/2020. CT chest dated 04/16/2018 FINDINGS: Cardiovascular: Heart is normal in size.  No pericardial effusion. No evidence of thoracic aortic aneurysm. Dense calcifications of the aortic root/valve. Atherosclerotic calcifications of the aortic arch. Coronary atherosclerosis of the LAD and left circumflex. Mediastinum/Nodes: No suspicious medial lymphadenopathy. Visualized thyroid is unremarkable. Lungs/Pleura: Mild scattered ground-glass opacities in the lungs bilaterally, including in the bilateral suprahilar/perihilar regions, favoring mild interstitial edema over infection. Moderate bilateral pleural effusions. Associated mild lower lobe compressive atelectasis. Scattered peribronchovascular hyperdensities in the right middle and lower lobes favors sequela of prior aspiration mild biapical pleural-parenchymal scarring. No suspicious pulmonary nodules. No pneumothorax. Upper Abdomen: Visualized upper abdomen is notable for vascular calcifications, a 2.3 cm left upper pole renal cyst, and a 4 mm nonobstructing left renal calculus (series  3/image 168). Musculoskeletal: Degenerative changes of the visualized thoracolumbar spine. IMPRESSION: Suspected mild interstitial edema. Moderate bilateral pleural effusions. Associated mild lower lobe compressive atelectasis. No findings suspicious for pneumonia. Dense calcifications of the aortic root/valve in this patient with known aortic stenosis. Aortic Atherosclerosis (ICD10-I70.0). Electronically Signed   By: Julian Hy M.D.   On: 12/29/2019 17:51   US RENAL  Result Date: 01/07/2020 CLINICAL DATA:  Acute renal injury EXAM: RENAL / URINARY TRACT ULTRASOUND COMPLETE COMPARISON:  None. FINDINGS: Right Kidney: Renal measurements: 9.2 x 4.3 x 4.1 cm. = volume: 84 mL. Cysts are again identified the largest of which lies in the upper pole measuring 1.9 cm. Diffuse increased echogenicity is noted. Left Kidney: Renal measurements: 7.7 x 4.0 x 3.7 cm. = volume: 59 mL. Only limited evaluation of the left kidney is noted due to overlying bowel gas. Bladder: Not well visualized Other: None. IMPRESSION: Limited exam due to poor visualization of the left kidney and urinary bladder. Right renal cysts stable from the prior exam. Electronically Signed   By: Inez Catalina M.D.   On: 01/10/2020 23:59   DG CHEST PORT 1 VIEW  Result Date: 12/23/2019 CLINICAL DATA:  Shortness of breath. EXAM: PORTABLE CHEST 1 VIEW COMPARISON:  12/22/2019 FINDINGS: The heart is borderline enlarged but stable. Stable tortuosity and calcification of the thoracic aorta. Overall improved lung aeration with better lung volumes and slight decrease in interstitial pulmonary edema. Smaller pleural effusions. No focal infiltrates. IMPRESSION: Improved lung volumes and slight decrease in pulmonary edema. Smaller pleural effusions. Electronically Signed   By: Marijo Sanes M.D.   On: 12/23/2019 09:19   DG Chest Portable 1 View  Result Date: 01/16/2020 CLINICAL DATA:  Shortness of breath. EXAM: PORTABLE CHEST 1 VIEW COMPARISON:  Chest  radiograph 11/24/2019 FINDINGS: Mild cardiomegaly, unchanged. Aortic atherosclerosis. Central pulmonary vascular congestion has increased from prior examination. Gradient opacity at the right greater than left lung bases likely reflecting layering pleural effusions. There are superimposed interstitial and ill-defined airspace opacities with a mid to lower lung predominance likely reflecting edema. No  evidence of pneumothorax. Thoracic spondylosis. No acute bony abnormality. Overlying cardiac monitoring leads. In opacity projects over the medial right lung apex and superior mediastinum likely reflecting a skin fold. IMPRESSION: entral pulmonary vascular congestion has increased from prior exam 11/24/2019. Also increased from prior exam, there are bilateral interstitial and ill-defined airspace opacities with a mid to lower lung predominance, favored to reflect edema. Infection cannot be definitively excluded. Layering bilateral pleural effusions. Mild cardiomegaly with aortic atherosclerosis. Electronically Signed   By: Kellie Simmering DO   On: 12/27/2019 16:34   IR US CHEST  Result Date: 12/22/2019 CLINICAL DATA:  86 year old male referred for thoracentesis EXAM: CHEST ULTRASOUND COMPARISON:  None. FINDINGS: Scant pleural fluid. IMPRESSION: Scant pleural fluid on ultrasound without safe window for thoracentesis. Electronically Signed   By: Corrie Mckusick D.O.   On: 12/22/2019 16:35        Scheduled Meds: . aspirin EC  81 mg Oral Daily  . feeding supplement (ENSURE ENLIVE)  237 mL Oral TID BM  . furosemide  40 mg Oral Daily  . guaiFENesin  600 mg Oral BID  . levothyroxine  50 mcg Oral Q0600  . multivitamin with minerals  1 tablet Oral Daily  . mycophenolate  250 mg Oral q morning - 10a  . mycophenolate  500 mg Oral QHS  . nutrition supplement (JUVEN)  1 packet Oral BID BM  . predniSONE  20 mg Oral Q breakfast  . pyridostigmine  30 mg Oral QID  . rosuvastatin  20 mg Oral Daily   Continuous  Infusions:   LOS: 2 days    Time spent: 40 minutes    Irine Seal, MD [facility]   To contact the attending provider between 7A-7P or the covering provider during after hours 7P-7A, please log into the web site [email or web address] and access using universal [place] password for that web site. If you do not have the password, please call the hospital operator.  12/23/2019, 12:28 PM

## 2019-12-23 NOTE — Progress Notes (Signed)
NIF -30 VC 1.25

## 2019-12-23 NOTE — Significant Event (Signed)
Rapid Response Event Note  Overview: Hypotension, Aspiration ?   Initial Focused Assessment: I was called to see the patient about having low BPs after having several liquid stools. Earlier received 250cc bolus x 1. Per nurse, patient was given something to drink by his niece as well - started coughing and vomited as well, staff stated he was lethargic as well.  Nurse started 500cc NS bolus that the MD ordered and I went up to see him. SBP in the 80s when I arrived, patient was awake and oriented, he is very hard of hearing. Breathing comfortably, 100% on 2L, HR in the 80s. Skin warm but quite dry, appears dry overall. Not in acute distress.  Interventions: -- NS 500cc Bolus x 1 -- Albumin 25% x 1  Plan of Care: -- BP was already improving SBP into the 90s. -- If BP and mental status continue to improve - okay for PO intake - follow SLP -- High aspiration risk - aspiration precautions -- Should BP and/or mental status not improve, low threshold to transfer to PCU.    Event Summary:  Call Time 1834 Arrival Time 1836 End Time 1900   [place] Whedbee R

## 2019-12-23 NOTE — Progress Notes (Signed)
   Vital Signs MEWS/VS Documentation      12/23/2019 0735 12/23/2019 0844 12/23/2019 0945 12/23/2019 1357   MEWS Score:  0  0  1  3   MEWS Score Color:  Green  Green  Green  Yellow   Resp:  16  --  16  (!) 32   Pulse:  76  --  81  --   BP:  --  --  93/60  (!) 85/43   Temp:  --  --  97.6 F (36.4 C)  (!) 97.5 F (36.4 C)   O2 Device:  Room Air  --  Room Air  Nasal Cannula   O2 Flow Rate (L/min):  --  --  --  2 L/min   Level of Consciousness:  --  --  --  Alert      Patient's RR has increased, BP is softer Patient has also had 3 dark loose BMs.  MD notified, received orders for 250 cc bolus. Will follow yellow MEWS protocol. Charge nurse aware    Samuella Cota 12/23/2019,2:01 PM

## 2019-12-23 NOTE — Progress Notes (Addendum)
  Speech Language Pathology Treatment:    Patient Details Name: Jason Riley MRN: [id number] DOB: 30-Sep-1929 Today's Date: 12/23/2019 Time: 1353-1401 SLP Time Calculation (min) (ACUTE ONLY): 8 min  Assessment / Plan / Recommendation Clinical Impression  Pt seen for diet texture analysis, skilled ST targeting swallow function. Pt encountered sitting upright, with lunch tray placed in front of him. Pt on 2L oxygen via [place]. Oxygen saturation per tele in upper 90's-100. Nursing in room performing vitals check, patient with low BP, increased RR (at rest).   Pt reports low appetite. Pt seen with straw and cup sips of thin liquids, 1 mild cough following large cup sip, no other s/sx aspiration. Pt seen with one bite of chopped pear and one bite of chopped spaghetti w/meat: oral phase remarkable for prolonged mastication, but pt able to form a cohesive bolus, initiate the swallow with adequate oral clearance. No overt s/sx aspiration. Recommend continuation of current diet texture (D3) w/ thin liquids, intermittent supervision, upright positioning, small bites and sips.  ST to follow as per POC.   HPI HPI: Jason Riley is a 86 y.o. male with medical history of myasthenia gravis, severe aortic stenosis, history of prostate cancer status post prostatectomy, hyperlipidemia, hypothyroidism presents with a chief complaint of generalized weakness, dyspnea on exertion and decreased oral intake. Per chart family reports hacking cough. Recently hospitalized 2 weeks ago for a myasthenia gravis exacerbation and had to have plasmapheresis and evaluated by neurology. Chest CT no findings suspicious for pna. MBS 11/26/19 trace laryngeal penetration with consecutive straw sips thin, mild residue and sensate. Rec'd Dys 2/thin.        SLP Plan  Continue with current plan of care       Recommendations  Diet recommendations: Dysphagia 3 (mechanical soft);Thin liquid Medication Administration: Whole meds with  liquid Supervision: Patient able to self feed;Intermittent supervision to cue for compensatory strategies Compensations: Slow rate;Small sips/bites;Clear throat intermittently Postural Changes and/or Swallow Maneuvers: Seated upright 90 degrees                Oral Care Recommendations: Oral care BID Follow up Recommendations: Other (comment) SLP Visit Diagnosis: Dysphagia, unspecified (R13.10) Plan: Continue with current plan of care       [place] 12/23/2019, 2:04 PM  Marina Goodell, M.Ed., [facility] [phone]: Acute Rehab office [phone] - pager

## 2019-12-24 DIAGNOSIS — I959 Hypotension, unspecified: Secondary | ICD-10-CM

## 2019-12-24 LAB — URINE CULTURE: Culture: 80000 — AB

## 2019-12-24 MED ORDER — CHLORHEXIDINE GLUCONATE CLOTH 2 % EX PADS: 6.0000 | MEDICATED_PAD | Freq: Every day | CUTANEOUS | Status: DC

## 2019-12-25 DIAGNOSIS — R0600 Dyspnea, unspecified: Secondary | ICD-10-CM

## 2019-12-25 DIAGNOSIS — I959 Hypotension, unspecified: Secondary | ICD-10-CM

## 2019-12-26 LAB — CULTURE, BLOOD (ROUTINE X 2)
Culture: NO GROWTH
Culture: NO GROWTH
Special Requests: ADEQUATE
Special Requests: ADEQUATE

## 2019-12-30 ENCOUNTER — Ambulatory Visit: Payer: Medicare Other | Admitting: Family Medicine

## 2020-01-21 NOTE — Death Summary Note (Signed)
DEATH SUMMARY   Patient Details  Name: Jason Riley MRN: [id number] DOB: 04/23/1929  Admission/Discharge Information   Admit Date:  Jan 13, 2020  Date of Death: Date of Death: 01-16-20  Time of Death: Time of Death: 0632  Length of Stay: 3  Referring Physician: [name], MD   Reason(s) for Hospitalization  Generalized weakness and shortness of breath/dyspnea on exertion x1 week that has progressively gotten worse.  Diagnoses  Preliminary cause of death:  Secondary Diagnoses (including complications and co-morbidities):  Active Problems:   Aortic stenosis with mitral and aortic insufficiency   CKD (chronic kidney disease), stage III (HCC)   Hyperlipidemia   Hypothyroid   History of prostate cancer   AKI (acute kidney injury) ([place])   Severe aortic stenosis   Weakness generalized   Presbycusis of both ears   Protein-calorie malnutrition, severe   Dyspnea on exertion   Pressure injury of skin   Adult failure to thrive   Goals of care, counseling/discussion   DNR (do not resuscitate)   Palliative care by specialist   Diarrhea   Leukocytosis   Brief Hospital Course (including significant findings, care, treatment, and services provided and events leading to death)  Jason Riley is a 86 y.o. year old male who has a medical history of myasthenia gravis, CVA aortic stenosis, prostate cancer status post prostatectomy, hypothyroidism hyperlipidemia who presented with complaint of generalized weakness and dyspnea on exertion x1 week.  Initial concern was for volume overload and severe aortic stenosis in the setting of myasthenia gravis.  Cardiology consulted and neurology consulted.  It was felt per cardiology patient was not a candidate for kind of invasive cardiac evaluation or treatment.  Patient did receive IV Lasix and subsequently transition to oral Lasix which was subsequently held.  1 hypovolemic hypotension likely secondary to C. difficile colitis in the setting of severe  aortic stenosis During the hospitalization patient noted to have multiple episodes of diarrhea C. difficile PCR was checked which came back positive.  Patient was placed on oral vancomycin.  Patient however noted to have bouts of hypotension and diuretics discontinued.  Patient was given fluid boluses with some improvement temporarily with his blood pressure.  Patient remained afebrile.  Patient had a leukocytosis.  During the evening patient noted to decompensate become more hypotensive was given fluid boluses and placed on IV albumin however with no significant improvement with hypotension.  Patient subsequently deteriorated and died at 84 hrs. the morning of 2020/01/16.  May his soul rest in peace.  2 dyspnea on exertion/acute respiratory failure with hypoxia in the setting of chronic diastolic failure/critical aortic stenosis Initially felt secondary to pneumonia on presentation however that has been ruled out and antibiotics discontinued.  COVID-19 PCR negative.  Chest x-ray done concerning for pulmonary vascular congestion, bilateral layering pleural effusions.  Patient initially placed on IV fluids due to concern for volume depletion however this was discontinued after CT scan obtained.  CT chest with mild interstitial edema, moderate bilateral pleural effusions, mild lower lobe compressive atelectasis no findings of pneumonia.  Patient was on IV Lasix with urine output of 2.9 L. 2D echo from 11/25/2019 with abnormal aortic valve with very severe aortic valve stenosis with a valve mean gradient of 60 mmHg.  Moderate mitral valve regurgitation.  Moderate to severe tricuspid valvular regurgitation.  EF 55 to 60%.  Mild LVH.  Grade 2 diastolic dysfunction.  Moderately elevated pulmonary artery systolic pressure.  Patient assessed by cardiology and deemed not a  candidate for any kind of invasive cardiac evaluation or treatment as patient noted to be very frail.  IV Lasix transition to oral Lasix 40 mg daily per  cardiology which was subsequently discontinued due to hypotension.  Per cardiology no further IV diuresis as patient will be at high risk for AKI and hemodynamic problems with his kidney disease and critical aortic stenosis.  Patient with history of myasthenia gravis with recent treatment and had presented with shortness of breath and generalized weakness.    Neurology was also consulted.  Patient deteriorated.  Patient became hypotensive.  Patient diagnosed with C. difficile colitis and started on oral vancomycin.  Patient however continued to deteriorate remained hypotensive despite fluid boluses and albumin and subsequently died the morning of 84-11-21 May his soul rest in peace.   2.  Generalized weakness/poor oral intake Patient with history of myasthenia gravis.  Patient recently treated for myasthenia gravis flare.  Patient presented with generalized weakness poor oral intake.  TSH within normal limits.  Urinalysis negative for UTI.  Urine cultures pending.  Blood cultures pending with no growth to date.  Patient was being diuresed secondary to problem #1.  Patient with loose stools such as C. difficile PCR was obtained which came back positive.  Patient initially started on oral vancomycin.  Patient however became hypotensive and given fluid boluses however continued to be hypotensive despite fluid boluses and subsequently died the morning of March 84, 2021.  3.  Acute kidney injury on chronic kidney disease stage IIIb/metabolic acidosis Patient recently discharged with a creatinine baseline of 1.7.  Creatinine on admission was 3.6 and felt likely secondary to a prerenal azotemia in the setting of decreased oral intake and dehydration.  Patient was on IV fluids which have subsequently been discontinued.  Renal ultrasound done negative for hydronephrosis.    Creatinine started to trend down.  Patient initially was on IV Lasix due to concern for volume overload and transition to oral Lasix.  As patient's  blood pressure worsened diuretics were discontinued.  Patient subsequently was diagnosed with a C. difficile colitis.  Patient subsequently became hypotensive and subsequently died the morning of 2019-12-31.   4.  Diarrhea Patient with multiple loose stools this morning.  Patient did complain of not having any bowel movement however per RN NST patient did have large loose stools.  Check a C. difficile PCR.  Follow.  5.  Severe protein calorie malnutrition/ftt Palliative care was consulted.  Patient subsequently made a DNR.  6.  Myasthenia gravis Patient with a recent admission for myasthenia gravis flare and recently had plasmapheresis.  Patient with a dysphagia 3 diet.  Patient does state he was compliant with his medications however there is some concern for medication noncompliance.  Patient was seen by speech therapy and maintained on a dysphagia 3 diet.  Patient was maintained on his home regimen of mycophenolate, pyridostigmine, prednisone.  Patient with a NIF of -30.  Due to patient's presentation with generalized weakness and poor oral intake neurology was consulted to follow the patient throughout the hospitalization.   Palliative care was consulted and patient subsequently made a DNR. Patient unfortunately decompensated and was diagnosed with C. difficile colitis and placed on oral vancomycin.  Patient became hypotensive and subsequently died at 0632 hrs. on 12-31-19.   7.  Hypothyroidism TSH of 2.970.    Patient maintained on home regimen of Synthroid.  8.  Hyperlipidemia Patient was maintained on a statin.  9.  Leukocytosis Patient with a mild leukocytosis.  Urine cultures were done with no growth to date.  Blood cultures obtained with no growth to date.  C. difficile PCR was obtained which was positive.  Patient was started on oral vancomycin.  Patient however deteriorated went into hypovolemic hypotension and subsequently died at 0632 hrs. on Jan 03, 2020.  10.   Thrombocytopenia Platelet count worsening.  Patient with no overt bleeding.    Lovenox was discontinued.  Patient deteriorated was noted to be C. difficile positive became hypotensive and subsequently died at 0632 hrs. on Jan 04, 2020.      Pertinent Labs and Studies  Significant Diagnostic Studies DG Chest 2 View  Result Date: 12/22/2019 CLINICAL DATA:  86 year old male with shortness of breath. My as the knee a gravis. EXAM: CHEST - 2 VIEW COMPARISON:  Chest CT and radiographs yesterday, and earlier. FINDINGS: Portable AP semi upright view at 0704 hours. Mediastinal contours remain normal. Veiling opacity in both lower lungs has increased from the portable chest yesterday. Superimposed pulmonary vascular congestion. No pneumothorax. No air bronchograms identified. No acute osseous abnormality identified. IMPRESSION: Bilateral pleural effusions and pulmonary interstitial edema. Pleural fluid volume may be mildly increased since yesterday. Electronically Signed   By: Genevie Ann M.D.   On: 12/22/2019 08:59   DG Chest 2 View  Result Date: 11/24/2019 CLINICAL DATA:  Dysphagia. EXAM: CHEST - 2 VIEW COMPARISON:  May 02, 2018 FINDINGS: Mild chronic appearing increased lung markings are seen. A stable subcentimeter calcified nodular opacity is seen overlying the lateral aspect of the mid right lung. There is no evidence of a pleural effusion or pneumothorax. The heart size and mediastinal contours are within normal limits. There is moderate severity calcification of the aortic arch. Multilevel degenerative changes seen throughout the thoracic spine. IMPRESSION: Stable exam without evidence of acute or active cardiopulmonary disease. Electronically Signed   By: Virgina Norfolk M.D.   On: 11/24/2019 17:11   CT CHEST WO CONTRAST  Result Date: 12/26/2019 CLINICAL DATA:  Pneumonia.  Myasthenia gravis. EXAM: CT CHEST WITHOUT CONTRAST TECHNIQUE: Multidetector CT imaging of the chest was performed following the  standard protocol without IV contrast. COMPARISON:  Chest radiograph dated 12/27/2019. CT chest dated 04/16/2018 FINDINGS: Cardiovascular: Heart is normal in size.  No pericardial effusion. No evidence of thoracic aortic aneurysm. Dense calcifications of the aortic root/valve. Atherosclerotic calcifications of the aortic arch. Coronary atherosclerosis of the LAD and left circumflex. Mediastinum/Nodes: No suspicious medial lymphadenopathy. Visualized thyroid is unremarkable. Lungs/Pleura: Mild scattered ground-glass opacities in the lungs bilaterally, including in the bilateral suprahilar/perihilar regions, favoring mild interstitial edema over infection. Moderate bilateral pleural effusions. Associated mild lower lobe compressive atelectasis. Scattered peribronchovascular hyperdensities in the right middle and lower lobes favors sequela of prior aspiration mild biapical pleural-parenchymal scarring. No suspicious pulmonary nodules. No pneumothorax. Upper Abdomen: Visualized upper abdomen is notable for vascular calcifications, a 2.3 cm left upper pole renal cyst, and a 4 mm nonobstructing left renal calculus (series 3/image 168). Musculoskeletal: Degenerative changes of the visualized thoracolumbar spine. IMPRESSION: Suspected mild interstitial edema. Moderate bilateral pleural effusions. Associated mild lower lobe compressive atelectasis. No findings suspicious for pneumonia. Dense calcifications of the aortic root/valve in this patient with known aortic stenosis. Aortic Atherosclerosis (ICD10-I70.0). Electronically Signed   By: Julian Hy M.D.   On: 01/08/2020 17:51   US RENAL  Result Date: 12/25/2019 CLINICAL DATA:  Acute renal injury EXAM: RENAL / URINARY TRACT ULTRASOUND COMPLETE COMPARISON:  None. FINDINGS: Right Kidney: Renal measurements: 9.2 x 4.3 x 4.1 cm. = volume: 84  mL. Cysts are again identified the largest of which lies in the upper pole measuring 1.9 cm. Diffuse increased echogenicity is  noted. Left Kidney: Renal measurements: 7.7 x 4.0 x 3.7 cm. = volume: 59 mL. Only limited evaluation of the left kidney is noted due to overlying bowel gas. Bladder: Not well visualized Other: None. IMPRESSION: Limited exam due to poor visualization of the left kidney and urinary bladder. Right renal cysts stable from the prior exam. Electronically Signed   By: Inez Catalina M.D.   On: 01/18/2020 23:59   IR Fluoro Guide CV Line Right  Result Date: 11/25/2019 INDICATION: 86 year old male referred for plasmapheresis catheter placement EXAM: TUNNELED CENTRAL VENOUS HEMODIALYSIS CATHETER PLACEMENT WITH ULTRASOUND AND FLUOROSCOPIC GUIDANCE MEDICATIONS: 2 g Ancef. The antibiotic was given in an appropriate time interval prior to skin puncture. ANESTHESIA/SEDATION: Moderate (conscious) sedation was employed during this procedure. A total of Versed 1.0 mg and Fentanyl 50 mcg was administered intravenously. Moderate Sedation Time: 14 minutes. The patient's level of consciousness and vital signs were monitored continuously by radiology nursing throughout the procedure under my direct supervision. FLUOROSCOPY TIME:  Fluoroscopy Time: 0 minutes 12 seconds (5 mGy). COMPLICATIONS: None PROCEDURE: Informed written consent was obtained from the patient after a discussion of the risks, benefits, and alternatives to treatment. Questions regarding the procedure were encouraged and answered. The right neck and chest were prepped with chlorhexidine in a sterile fashion, and a sterile drape was applied covering the operative field. Maximum barrier sterile technique with sterile gowns and gloves were used for the procedure. A timeout was performed prior to the initiation of the procedure. After creating a small venotomy incision, a micropuncture kit was utilized to access the right internal jugular vein under direct, real-time ultrasound guidance after the overlying soft tissues were anesthetized with 1% lidocaine with epinephrine.  Ultrasound image documentation was performed. The microwire was marked to measure appropriate internal catheter length. External tunneled length was estimated. A total tip to cuff length of 19 cm was selected. Skin and subcutaneous tissues of chest wall below the clavicle were generously infiltrated with 1% lidocaine for local anesthesia. A small stab incision was made with 11 blade scalpel. The selected hemodialysis catheter was tunneled in a retrograde fashion from the anterior chest wall to the venotomy incision. A guidewire was advanced to the level of the IVC and the micropuncture sheath was exchanged for a peel-away sheath. The catheter was then placed through the peel-away sheath with tips ultimately positioned within the superior aspect of the right atrium. Final catheter positioning was confirmed and documented with a spot radiographic image. The catheter aspirates and flushes normally. The catheter was flushed with appropriate volume heparin dwells. The catheter exit site was secured with a 0-Prolene retention suture. The venotomy incision was closed Derma bond and sterile dressing. Dressings were applied at the chest wall. Patient tolerated the procedure well and remained hemodynamically stable throughout. No complications were encountered and no significant blood loss encountered. IMPRESSION: Status post right IJ tunneled hemodialysis catheter placement, for use of plasmapheresis. Signed, Dulcy Fanny. Dellia Nims, RPVI [facility] Electronically Signed   By: Corrie Mckusick D.O.   On: 11/25/2019 11:38   IR Removal Tun Cv Cath W/O FL  Result Date: 12/04/2019 INDICATION: Patient with acute myasthenia gravis exacerbation requiring plasmapheresis via tunneled right IJ plasmapheresis catheter place 02/30/2021 in IR. Patient planned for discharge home today and no longer requiring plasmapheresis-request for removal of tunneled right IJ catheter.  EXAM:  REMOVAL OF TUNNELED PLASMAPHERESIS CATHETER MEDICATIONS: None COMPLICATIONS: None immediate. PROCEDURE: Informed written consent was obtained from the patient following an explanation of the procedure, risks, benefits and alternatives to treatment. A time out was performed prior to the initiation of the procedure. Maximal barrier sterile technique was utilized including caps, mask, sterile gowns, sterile gloves, large sterile drape, hand hygiene, and Hibiclens. Utilizing gentle traction, the catheter was removed intact. Hemostasis was obtained with manual compression. A dressing was placed. The patient tolerated the procedure well without immediate post procedural complication. IMPRESSION: Successful removal of tunneled plasmapheresis catheter. Read by Candiss Norse, PA-C Electronically Signed   By: Aletta Edouard M.D.   On: 12/04/2019 15:02   IR US Guide Vasc Access Right  Result Date: 11/25/2019 INDICATION: 86 year old male referred for plasmapheresis catheter placement EXAM: TUNNELED CENTRAL VENOUS HEMODIALYSIS CATHETER PLACEMENT WITH ULTRASOUND AND FLUOROSCOPIC GUIDANCE MEDICATIONS: 2 g Ancef. The antibiotic was given in an appropriate time interval prior to skin puncture. ANESTHESIA/SEDATION: Moderate (conscious) sedation was employed during this procedure. A total of Versed 1.0 mg and Fentanyl 50 mcg was administered intravenously. Moderate Sedation Time: 14 minutes. The patient's level of consciousness and vital signs were monitored continuously by radiology nursing throughout the procedure under my direct supervision. FLUOROSCOPY TIME:  Fluoroscopy Time: 0 minutes 12 seconds (5 mGy). COMPLICATIONS: None PROCEDURE: Informed written consent was obtained from the patient after a discussion of the risks, benefits, and alternatives to treatment. Questions regarding the procedure were encouraged and answered. The right neck and chest were prepped with chlorhexidine in a sterile fashion, and a sterile drape  was applied covering the operative field. Maximum barrier sterile technique with sterile gowns and gloves were used for the procedure. A timeout was performed prior to the initiation of the procedure. After creating a small venotomy incision, a micropuncture kit was utilized to access the right internal jugular vein under direct, real-time ultrasound guidance after the overlying soft tissues were anesthetized with 1% lidocaine with epinephrine. Ultrasound image documentation was performed. The microwire was marked to measure appropriate internal catheter length. External tunneled length was estimated. A total tip to cuff length of 19 cm was selected. Skin and subcutaneous tissues of chest wall below the clavicle were generously infiltrated with 1% lidocaine for local anesthesia. A small stab incision was made with 11 blade scalpel. The selected hemodialysis catheter was tunneled in a retrograde fashion from the anterior chest wall to the venotomy incision. A guidewire was advanced to the level of the IVC and the micropuncture sheath was exchanged for a peel-away sheath. The catheter was then placed through the peel-away sheath with tips ultimately positioned within the superior aspect of the right atrium. Final catheter positioning was confirmed and documented with a spot radiographic image. The catheter aspirates and flushes normally. The catheter was flushed with appropriate volume heparin dwells. The catheter exit site was secured with a 0-Prolene retention suture. The venotomy incision was closed Derma bond and sterile dressing. Dressings were applied at the chest wall. Patient tolerated the procedure well and remained hemodynamically stable throughout. No complications were encountered and no significant blood loss encountered. IMPRESSION: Status post right IJ tunneled hemodialysis catheter placement, for use of plasmapheresis. Signed, Dulcy Fanny. Dellia Nims, RPVI [facility]  [facility] Electronically Signed   By: Corrie Mckusick D.O.   On: 11/25/2019 11:38   DG CHEST PORT 1 VIEW  Result Date: 12/23/2019 CLINICAL DATA:  Weakness and dyspnea EXAM: PORTABLE CHEST 1 VIEW  COMPARISON:  12/23/2019 FINDINGS: Cardiac shadow is stable. Aortic calcifications are again seen. Bilateral pleural effusions are noted increased from the prior exam. Mild central pulmonary edema is again noted and stable. No bony abnormality is seen. IMPRESSION: Increasing bilateral pleural effusions. Electronically Signed   By: Inez Catalina M.D.   On: 12/23/2019 19:30   DG CHEST PORT 1 VIEW  Result Date: 12/23/2019 CLINICAL DATA:  Shortness of breath. EXAM: PORTABLE CHEST 1 VIEW COMPARISON:  12/22/2019 FINDINGS: The heart is borderline enlarged but stable. Stable tortuosity and calcification of the thoracic aorta. Overall improved lung aeration with better lung volumes and slight decrease in interstitial pulmonary edema. Smaller pleural effusions. No focal infiltrates. IMPRESSION: Improved lung volumes and slight decrease in pulmonary edema. Smaller pleural effusions. Electronically Signed   By: Marijo Sanes M.D.   On: 12/23/2019 09:19   DG Chest Portable 1 View  Result Date: 12/30/2019 CLINICAL DATA:  Shortness of breath. EXAM: PORTABLE CHEST 1 VIEW COMPARISON:  Chest radiograph 11/24/2019 FINDINGS: Mild cardiomegaly, unchanged. Aortic atherosclerosis. Central pulmonary vascular congestion has increased from prior examination. Gradient opacity at the right greater than left lung bases likely reflecting layering pleural effusions. There are superimposed interstitial and ill-defined airspace opacities with a mid to lower lung predominance likely reflecting edema. No evidence of pneumothorax. Thoracic spondylosis. No acute bony abnormality. Overlying cardiac monitoring leads. In opacity projects over the medial right lung apex and superior mediastinum likely reflecting a skin fold. IMPRESSION: entral  pulmonary vascular congestion has increased from prior exam 11/24/2019. Also increased from prior exam, there are bilateral interstitial and ill-defined airspace opacities with a mid to lower lung predominance, favored to reflect edema. Infection cannot be definitively excluded. Layering bilateral pleural effusions. Mild cardiomegaly with aortic atherosclerosis. Electronically Signed   By: Kellie Simmering DO   On: 01/04/2020 16:34   DG Swallowing Func-Speech Pathology  Result Date: 11/26/2019 Objective Swallowing Evaluation: Type of Study: MBS-Modified Barium Swallow Study  Patient Details Name: Jason Riley MRN: [id number] Date of Birth: January 21, 1929 Today's Date: 11/26/2019 Time: SLP Start Time (ACUTE ONLY): 0388 -SLP Stop Time (ACUTE ONLY): 8280 SLP Time Calculation (min) (ACUTE ONLY): 18 min Past Medical History: Past Medical History: Diagnosis Date . CN (constipation)  . Elevated transaminase level  . History of measles  . Hypertension  . Hypothyroidism  . Myasthenia gravis ([place])  . Myasthenia gravis ([place])  . Prostate cancer [facility])   Prostatectomy 2001 . Severe aortic stenosis  Past Surgical History: Past Surgical History: Procedure Laterality Date . APPENDECTOMY  1955 . HERNIA REPAIR Right 2006  Dr. Jamal Collin . IR FLUORO GUIDE CV LINE LEFT  05/03/2018 . IR FLUORO GUIDE CV LINE RIGHT  11/25/2019 . IR US GUIDE VASC ACCESS RIGHT  05/03/2018 . IR US GUIDE VASC ACCESS RIGHT  11/25/2019 . PROSTATECTOMY  2001  Dr. Yves Dill HPI: Pt is an 86 y.o. male with medical history significant of myasthenia gravis, severe aortic stenosis hypertension, hypothyroidism who presented with increasing generalized weakness, trouble swallowing, weight loss. Chest x-ray of 11/24/19 was stable without evidence of acute or active cardiopulmonary disease.  Subjective: Pt was pleasant and alert Assessment / Plan / Recommendation CHL IP CLINICAL IMPRESSIONS 11/26/2019 Clinical Impression  Pt was seen for a modified barium swallow study and he presents with mild  oropharyngeal dysphagia with resultant trace laryngeal penetration of thin liquid via straw sip on today's examination.  Laryngeal penetration was observed x1 during serial straw sips of thin liquid.  No aspiration was observed with  any trials and no laryngeal penetration was observed with puree, soft solids, or cup sips of thin liquid despite challenging.  Oral phase was remarkable for moderately prolonged mastication and AP transport of soft solids, reduced lingual strength resulting in trace oral residue, and reduced lingual control resulting in premature spillage to the pharynx.  Pharyngeal phase was remarkable for reduced BOT retraction and reduced hyolaryngeal elevation/excursion, resulting in mild vallecular and pyriform residue.  Pt appeared sensate to the residue and he was able to clear the majority of it given a spontaneous second swallow across trials.  Recommend continuation of Dysphagia 2 (fine chop) solids and thin liquids with medications whole or crushed in puree (per pt preference) and intermittent superversion to cue for the following compensatory strategies: 1) Small bites/sips 2) Slow rate of intake 3) Sit upright as possible.  SLP Visit Diagnosis Dysphagia, oropharyngeal phase (R13.12) Attention and concentration deficit following -- Frontal lobe and executive function deficit following -- Impact on safety and function Mild aspiration risk   [facility] IP TREATMENT RECOMMENDATION 11/26/2019 Treatment Recommendations Therapy as outlined in treatment plan below   Prognosis 11/26/2019 Prognosis for Safe Diet Advancement Good Barriers to Reach Goals -- Barriers/Prognosis Comment -- [facility] IP DIET RECOMMENDATION 11/26/2019 SLP Diet Recommendations Dysphagia 2 (Fine chop) solids;Thin liquid Liquid Administration via Cup Medication Administration Whole meds with puree Compensations Small sips/bites;Slow rate;Multiple dry swallows after each bite/sip;Minimize environmental distractions Postural Changes Seated upright  at 90 degrees   CHL IP OTHER RECOMMENDATIONS 11/26/2019 Recommended Consults -- Oral Care Recommendations Oral care BID Other Recommendations --   [facility]L IP FOLLOW UP RECOMMENDATIONS 11/26/2019 Follow up Recommendations Home health SLP   CHL IP FREQUENCY AND DURATION 11/26/2019 Speech Therapy Frequency (ACUTE ONLY) min 2x/week Treatment Duration 2 weeks      CHL IP ORAL PHASE 11/26/2019 Oral Phase Impaired Oral - Pudding Teaspoon -- Oral - Pudding Cup -- Oral - Honey Teaspoon -- Oral - Honey Cup -- Oral - Nectar Teaspoon -- Oral - Nectar Cup -- Oral - Nectar Straw -- Oral - Thin Teaspoon Piecemeal swallowing;Premature spillage Oral - Thin Cup Piecemeal swallowing;Lingual/palatal residue;Premature spillage Oral - Thin Straw Premature spillage;Lingual/palatal residue Oral - Puree Piecemeal swallowing;Decreased bolus cohesion;Delayed oral transit;Lingual/palatal residue Oral - Mech Soft Impaired mastication;Delayed oral transit;Lingual/palatal residue Oral - Regular -- Oral - Multi-Consistency -- Oral - Pill -- Oral Phase - Comment --  CHL IP PHARYNGEAL PHASE 11/26/2019 Pharyngeal Phase Impaired Pharyngeal- Pudding Teaspoon -- Pharyngeal -- Pharyngeal- Pudding Cup -- Pharyngeal -- Pharyngeal- Honey Teaspoon -- Pharyngeal -- Pharyngeal- Honey Cup -- Pharyngeal -- Pharyngeal- Nectar Teaspoon -- Pharyngeal -- Pharyngeal- Nectar Cup -- Pharyngeal -- Pharyngeal- Nectar Straw -- Pharyngeal -- Pharyngeal- Thin Teaspoon Delayed swallow initiation-pyriform sinuses;Reduced anterior laryngeal mobility;Reduced laryngeal elevation;Reduced tongue base retraction;Pharyngeal residue - valleculae;Pharyngeal residue - pyriform Pharyngeal Material does not enter airway Pharyngeal- Thin Cup Delayed swallow initiation-pyriform sinuses;Reduced anterior laryngeal mobility;Reduced laryngeal elevation;Reduced tongue base retraction;Pharyngeal residue - valleculae;Pharyngeal residue - pyriform Pharyngeal Material does not enter airway Pharyngeal- Thin Straw  Delayed swallow initiation-pyriform sinuses;Reduced epiglottic inversion;Reduced anterior laryngeal mobility;Reduced laryngeal elevation;Reduced airway/laryngeal closure;Reduced tongue base retraction;Penetration/Aspiration during swallow;Pharyngeal residue - valleculae;Pharyngeal residue - pyriform Pharyngeal Material enters airway, remains ABOVE vocal cords and not ejected out Pharyngeal- Puree Delayed swallow initiation-vallecula;Reduced anterior laryngeal mobility;Reduced laryngeal elevation;Reduced tongue base retraction;Pharyngeal residue - pyriform;Pharyngeal residue - valleculae Pharyngeal Material does not enter airway Pharyngeal- Mechanical Soft Delayed swallow initiation-vallecula;Reduced anterior laryngeal mobility;Reduced laryngeal elevation;Reduced tongue base retraction;Reduced pharyngeal peristalsis;Pharyngeal residue - valleculae;Pharyngeal residue - pyriform;Lateral  channel residue Pharyngeal Material does not enter airway Pharyngeal- Regular -- Pharyngeal -- Pharyngeal- Multi-consistency -- Pharyngeal -- Pharyngeal- Pill -- Pharyngeal -- Pharyngeal Comment --  No flowsheet data found. Colin Mulders., M.S., CCC-SLP Acute Rehabilitation Services Office: [phone] [place] 11/26/2019, 2:59 PM              ECHOCARDIOGRAM COMPLETE  Result Date: 11/25/2019   ECHOCARDIOGRAM REPORT   Patient Name:   Jason Riley Date of Exam: 11/25/2019 Medical Rec #:  [id number]    Height:       69.0 in Accession #:    [id number]   Weight:       133.2 lb Date of Birth:  10/31/1928     BSA:          1.74 m Patient Age:    39 years     BP:           117/49 mmHg Patient Gender: M            HR:           59 bpm. Exam Location:  Inpatient Procedure: Color Doppler, 2D Echo and Cardiac Doppler Indications:    I35.0 Nonrheumatic aortic (valve) stenosis  History:        Patient has prior history of Echocardiogram examinations, most                 recent 05/03/2018. CAD; Risk Factors:Hypertension. Cancer.  Sonographer:     Jonelle Sidle Dance Referring Phys: [id number] [place]  1. The aortic valve is abnormal. Aortic valve regurgitation is mild to moderate. Very severe aortic valve stenosis. Aortic valve mean gradient measures 60.0 mmHg. Valve area calculation may be inaccurate due to suboptimal LVOT doppler. Consider cardiology consultation for very severe aortic valve stenosis.  2. The mitral valve is abnormal in structure. At least moderate, eccentric mitral valve regurgitation. No evidence of mitral stenosis.  3. The tricuspid valve is normal in structure. Tricuspid valve regurgitation is moderate-severe.  4. Left ventricular ejection fraction, by visual estimation, is 55 to 60%. The left ventricle has normal function. There is mildly increased left ventricular hypertrophy.  5. Elevated left ventricular end-diastolic pressure.  6. Left ventricular diastolic parameters are consistent with Grade II diastolic dysfunction (pseudonormalization).  7. Global right ventricle has normal systolic function.The right ventricular size is normal. No increase in right ventricular wall thickness.  8. Moderately elevated pulmonary artery systolic pressure.  9. Left atrial size was moderately dilated. 10. Right atrial size was mildly dilated. 11. Moderate mitral annular calcification. 12. There is severe calcifcation of the aortic valve. 13. The pulmonic valve was normal in structure. Pulmonic valve regurgitation is trivial. 14. The inferior vena cava is normal in size with greater than 50% respiratory variability, suggesting right atrial pressure of 3 mmHg. 15. The interatrial septum was not well visualized. FINDINGS  Left Ventricle: Left ventricular ejection fraction, by visual estimation, is 55 to 60%. The left ventricle has normal function. The left ventricle has no regional wall motion abnormalities. There is mildly increased left ventricular hypertrophy. Left ventricular diastolic parameters are consistent with Grade II diastolic  dysfunction (pseudonormalization). Elevated left ventricular end-diastolic pressure. Right Ventricle: The right ventricular size is normal. No increase in right ventricular wall thickness. Global RV systolic function is has normal systolic function. The tricuspid regurgitant velocity is 3.23 m/s, and with an assumed right atrial pressure  of 3 mmHg, the estimated right ventricular systolic pressure is moderately elevated at  44.7 mmHg. Left Atrium: Left atrial size was moderately dilated. Right Atrium: Right atrial size was mildly dilated Pericardium: There is no evidence of pericardial effusion. Mitral Valve: The mitral valve is abnormal. Moderate mitral annular calcification. Moderate mitral valve regurgitation. No evidence of mitral valve stenosis by observation. Tricuspid Valve: The tricuspid valve is normal in structure. Tricuspid valve regurgitation is moderate-severe. Aortic Valve: The aortic valve is abnormal. Aortic valve regurgitation is mild to moderate. Aortic regurgitation PHT measures 339 msec. Severe aortic stenosis is present. There is severe calcifcation of the aortic valve. Aortic valve mean gradient measures 60.0 mmHg. Aortic valve peak gradient measures 94.6 mmHg. Aortic valve area, by VTI measures 0.47 cm. Pulmonic Valve: The pulmonic valve was normal in structure. Pulmonic valve regurgitation is trivial. Pulmonic regurgitation is trivial. Aorta: The aortic root and ascending aorta are structurally normal, with no evidence of dilitation. Venous: The inferior vena cava is normal in size with greater than 50% respiratory variability, suggesting right atrial pressure of 3 mmHg. IAS/Shunts: The interatrial septum was not well visualized.  LEFT VENTRICLE PLAX 2D LVIDd:         4.22 cm  Diastology LVIDs:         2.96 cm  LV e' lateral:   9.83 cm/s LV PW:         1.03 cm  LV E/e' lateral: 11.9 LV IVS:        1.03 cm  LV e' medial:    5.18 cm/s LVOT diam:     2.30 cm  LV E/e' medial:  22.6 LV SV:          46 ml LV SV Index:   26.71 LVOT Area:     4.15 cm  RIGHT VENTRICLE             IVC RV Basal diam:  2.90 cm     IVC diam: 1.20 cm RV S prime:     10.90 cm/s TAPSE (M-mode): 2.1 cm LEFT ATRIUM             Index       RIGHT ATRIUM           Index LA diam:        3.60 cm 2.07 cm/m  RA Area:     20.00 cm LA Vol (A2C):   63.1 ml 36.30 ml/m RA Volume:   54.10 ml  31.12 ml/m LA Vol (A4C):   92.8 ml 53.39 ml/m LA Biplane Vol: 77.6 ml 44.64 ml/m  AORTIC VALVE AV Area (Vmax):    0.44 cm AV Area (Vmean):   0.47 cm AV Area (VTI):     0.47 cm AV Vmax:           486.33 cm/s AV Vmean:          349.000 cm/s AV VTI:            1.337 m AV Peak Grad:      94.6 mmHg AV Mean Grad:      60.0 mmHg LVOT Vmax:         51.70 cm/s LVOT Vmean:        39.500 cm/s LVOT VTI:          0.150 m LVOT/AV VTI ratio: 0.11 AI PHT:            339 msec  AORTA Ao Root diam: 3.50 cm Ao Asc diam:  3.40 cm MITRAL VALVE  TRICUSPID VALVE MV Area (PHT): 2.93 cm              TR Peak grad:   41.7 mmHg MV PHT:        74.96 msec            TR Vmax:        323.00 cm/s MV Decel Time: 259 msec MV E velocity: 117.00 cm/s 103 cm/s  SHUNTS MV A velocity: 51.70 cm/s  70.3 cm/s Systemic VTI:  0.15 m MV E/A ratio:  2.26        1.5       Systemic Diam: 2.30 cm  Cherlynn Kaiser MD Electronically signed by Cherlynn Kaiser MD Signature Date/Time: 11/25/2019/5:57:02 PM    Final    IR US CHEST  Result Date: 12/22/2019 CLINICAL DATA:  86 year old male referred for thoracentesis EXAM: CHEST ULTRASOUND COMPARISON:  None. FINDINGS: Scant pleural fluid. IMPRESSION: Scant pleural fluid on ultrasound without safe window for thoracentesis. Electronically Signed   By: Corrie Mckusick D.O.   On: 12/22/2019 16:35    Microbiology Recent Results (from the past 240 hour(s))  Blood culture (routine x 2)     Status: None (Preliminary result)   Collection Time: 01/15/2020  4:24 PM   Specimen: BLOOD  Result Value Ref Range Status   Specimen Description BLOOD  RIGHT ANTECUBITAL  Final   Special Requests   Final    BOTTLES DRAWN AEROBIC AND ANAEROBIC Blood Culture adequate volume   Culture   Final    NO GROWTH 2 DAYS Performed at [facility], [address] [place], [place] [address]    Report Status PENDING  Incomplete  Blood culture (routine x 2)     Status: None (Preliminary result)   Collection Time: 01/14/2020  6:08 PM   Specimen: BLOOD  Result Value Ref Range Status   Specimen Description BLOOD LEFT ANTECUBITAL  Final   Special Requests   Final    BOTTLES DRAWN AEROBIC AND ANAEROBIC Blood Culture adequate volume   Culture   Final    NO GROWTH 2 DAYS Performed at [facility], [place] [address], [place], [place] [address]    Report Status PENDING  Incomplete  SARS CORONAVIRUS 2 (TAT 6-24 HRS) Nasopharyngeal Nasopharyngeal Swab     Status: None   Collection Time: 01/16/2020  6:24 PM   Specimen: Nasopharyngeal Swab  Result Value Ref Range Status   SARS Coronavirus 2 NEGATIVE NEGATIVE Final    Comment: (NOTE) SARS-CoV-2 target nucleic acids are NOT DETECTED. The SARS-CoV-2 RNA is generally detectable in upper and lower respiratory specimens during the acute phase of infection. Negative results do not preclude SARS-CoV-2 infection, do not rule out co-infections with other pathogens, and should not be used as the sole basis for treatment or other patient management decisions. Negative results must be combined with clinical observations, patient history, and epidemiological information. The expected result is Negative. Fact Sheet for Patients: SugarRoll.be Fact Sheet for Healthcare Providers: https://www.woods-mathews.com/ This test is not yet approved or cleared by the [place] FDA and  has been authorized for detection and/or diagnosis of SARS-CoV-2 by FDA under an Emergency Use Authorization (EUA). This EUA will remain  in effect (meaning this test can be used) for the duration  of the COVID-19 declaration under Section 56 4(b)(1) of the Act, 21 U.S.C. section 360bbb-3(b)(1), unless the authorization is terminated or revoked sooner. Performed at [facility], [place] [address] [place], [place] [address]  Urine culture     Status: Abnormal   Collection Time: 01/13/2020  7:30 PM   Specimen: Urine, Random  Result Value Ref Range Status   Specimen Description URINE, RANDOM  Final   Special Requests   Final    NONE Performed at [facility], [address], [place], [place] [address]    Culture 80,000 COLONIES/mL PROTEUS MIRABILIS (A)  Final   Report Status 01/19/20 FINAL  Final   Organism ID, Bacteria PROTEUS MIRABILIS (A)  Final      Susceptibility   Proteus mirabilis - MIC*    AMPICILLIN <=2 SENSITIVE Sensitive     CEFAZOLIN <=4 SENSITIVE Sensitive     CEFTRIAXONE <=0.25 SENSITIVE Sensitive     CIPROFLOXACIN <=0.25 SENSITIVE Sensitive     GENTAMICIN <=1 SENSITIVE Sensitive     IMIPENEM 2 SENSITIVE Sensitive     NITROFURANTOIN 128 RESISTANT Resistant     TRIMETH/SULFA <=20 SENSITIVE Sensitive     AMPICILLIN/SULBACTAM <=2 SENSITIVE Sensitive     PIP/TAZO <=4 SENSITIVE Sensitive     * 80,000 COLONIES/mL PROTEUS MIRABILIS  C difficile quick scan w PCR reflex     Status: Abnormal   Collection Time: 12/23/19  4:05 PM   Specimen: Stool  Result Value Ref Range Status   C Diff antigen POSITIVE (A) NEGATIVE Final   C Diff toxin POSITIVE (A) NEGATIVE Final   C Diff interpretation Toxin producing C. difficile detected.  Final    Comment: CRITICAL RESULT CALLED TO, READ BACK BY AND VERIFIED WITH: Randall An RN 12/23/19 1820 JDW Performed at [facility], [place] [address] [place], [place] [address]     Lab Basic Metabolic Panel: Recent Labs  Lab 01/18/2020 1313 01/12/2020 2203 12/22/19 0421 12/23/19 0446  NA 141  --  146* 146*  K 5.0  --  4.5 4.4  CL 107  --  110 110  CO2 21*  --  21* 23  GLUCOSE 119*  --  97 116*  BUN 91*  --  91* 117*   CREATININE 3.16* 3.06* 3.06* 2.89*  CALCIUM 9.9  --  9.6 9.4  MG  --   --   --  2.7*  PHOS  --   --   --  4.4   Liver Function Tests: Recent Labs  Lab 12/22/19 0421 12/23/19 0446  AST 29 23  ALT 60* 46*  ALKPHOS 117 88  BILITOT 1.0 0.9  PROT 5.3* 4.8*  ALBUMIN 3.4* 2.7*   No results for input(s): LIPASE, AMYLASE in the last 168 hours. No results for input(s): AMMONIA in the last 168 hours. CBC: Recent Labs  Lab 01/14/2020 1313 01/01/2020 2203 12/22/19 0421 12/23/19 0446  WBC 14.8* 12.3* 11.8* 11.1*  NEUTROABS  --   --  10.5* 9.9*  HGB 13.8 13.7 13.4 12.2*  HCT 44.2 42.2 41.3 38.2*  MCV 98.9 97.0 98.3 99.7  PLT 83* 73* 72* 66*   Cardiac Enzymes: No results for input(s): CKTOTAL, CKMB, CKMBINDEX, TROPONINI in the last 168 hours. Sepsis Labs: Recent Labs  Lab 01/12/2020 1313 12/27/2019 1624 12/27/2019 1818 01/17/2020 2203 12/22/19 0421 12/23/19 0446  WBC 14.8*  --   --  12.3* 11.8* 11.1*  LATICACIDVEN  --  2.3* 1.8  --   --   --     Procedures/Operations   CT chest 12/26/2019  Chest x-ray 01/20/2020, 12/22/2019  Renal ultrasound 01/15/2020   No charge   Irine Seal 01/19/20, 7:41 AM

## 2020-01-21 NOTE — Progress Notes (Signed)
Palliative:  Alerted that Mr. Dinardi passed away this AM. Called to check in with Fredricka Bonine and offer emotional support. All questions and concerns answered.  Juel Burrow, DNP, AGNP-C Palliative Medicine Team Team Phone # [phone]  Pager # [phone]  NO CHARGE

## 2020-01-21 NOTE — Progress Notes (Signed)
Patient has passed at [phone]. On call NP, Sharlet Salina, notified. Awaiting call back.  RN called both relatives listed on emergency contacts with no answer. Left message for Cammy Brochure.

## 2020-01-21 NOTE — Progress Notes (Signed)
Palliative care consult yesterday Patient was made DNR overnight, hospice  Patient passed away.

## 2020-01-21 DEATH — deceased

## 2020-10-17 IMAGING — DX DG CHEST 1V PORT
1 series · 1 of 1 positions shown · non-contrast
Comparison: Chest radiograph 11/24/2019

CLINICAL DATA: Shortness of breath.

EXAM:
PORTABLE CHEST 1 VIEW

[chest ap]
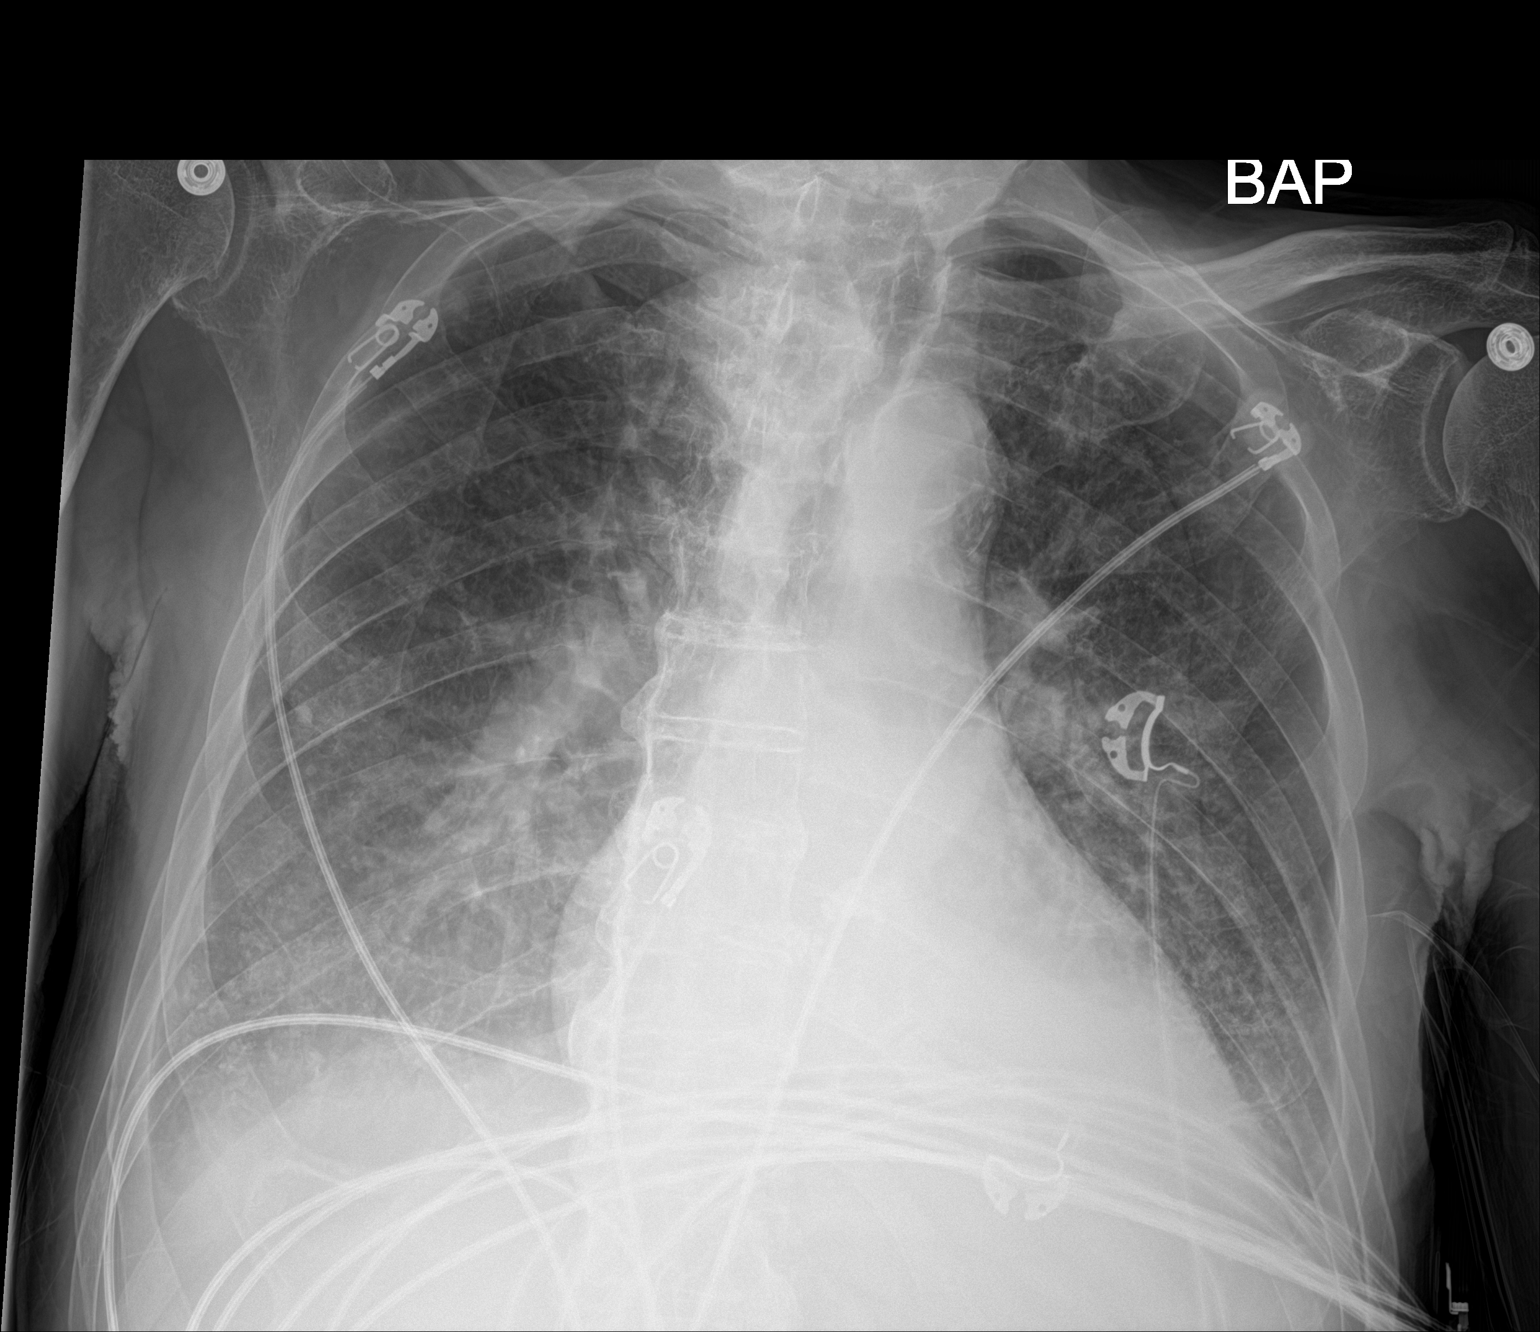

[1 of 1 positions shown; findings below may reference images not displayed]

FINDINGS: Mild cardiomegaly, unchanged. Aortic atherosclerosis. Central
pulmonary vascular congestion has increased from prior examination.
Gradient opacity at the right greater than left lung bases likely
reflecting layering pleural effusions. There are superimposed
interstitial and ill-defined airspace opacities with a mid to lower
lung predominance likely reflecting edema. No evidence of
pneumothorax. Thoracic spondylosis. No acute bony abnormality.
Overlying cardiac monitoring leads. In opacity projects over the
medial right lung apex and superior mediastinum likely reflecting a
skin fold.
IMPRESSION: entral pulmonary vascular congestion has increased from prior exam
11/24/2019. Also increased from prior exam, there are bilateral
interstitial and ill-defined airspace opacities with a mid to lower
lung predominance, favored to reflect edema. Infection cannot be
definitively excluded.

Layering bilateral pleural effusions.

Mild cardiomegaly with aortic atherosclerosis.

## 2020-10-18 IMAGING — US IR CHEST US
1 series · 7 of 7 positions shown · non-contrast
Comparison: None.

CLINICAL DATA: 89-year-old male referred for thoracentesis

EXAM:
CHEST ULTRASOUND

[Series 1: (id) · 7 of 7 slices shown]
[im 1/7]
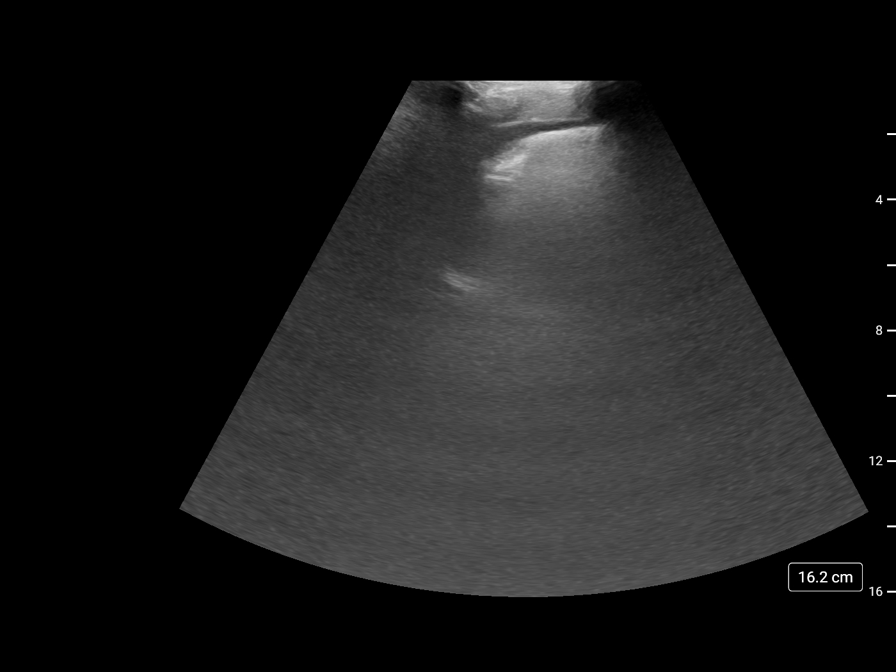
[im 2/7]
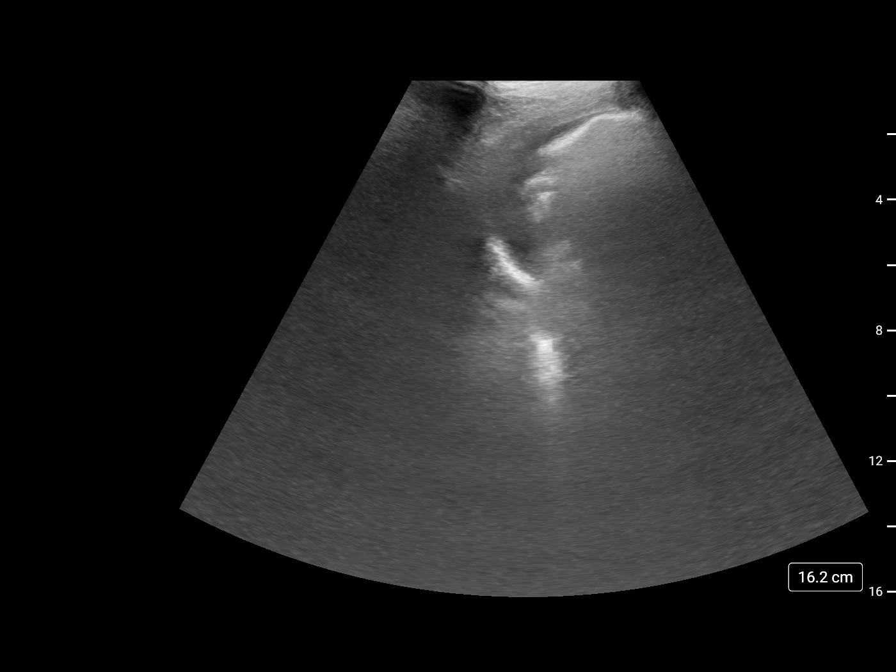
[im 3/7]
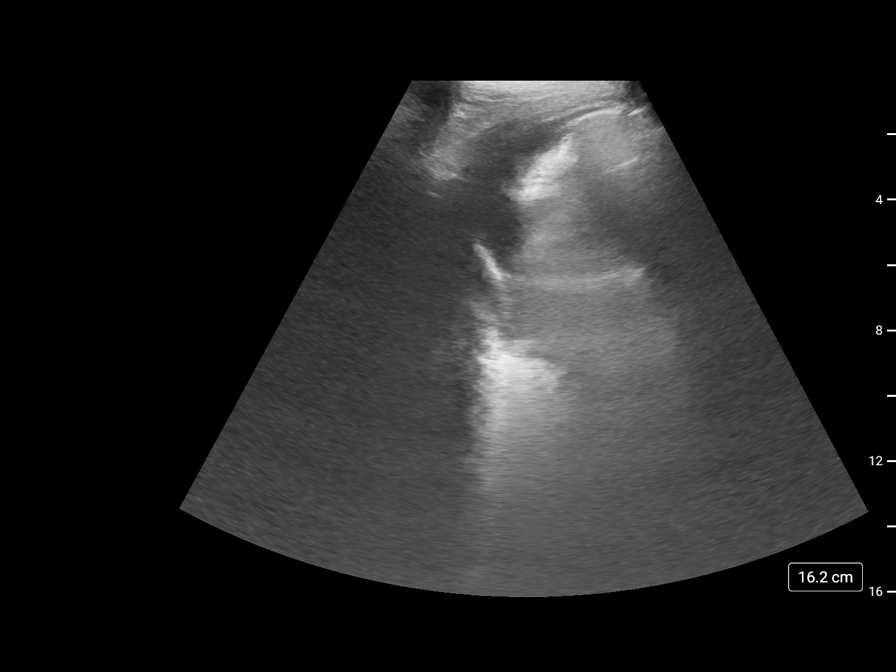
[im 4/7]
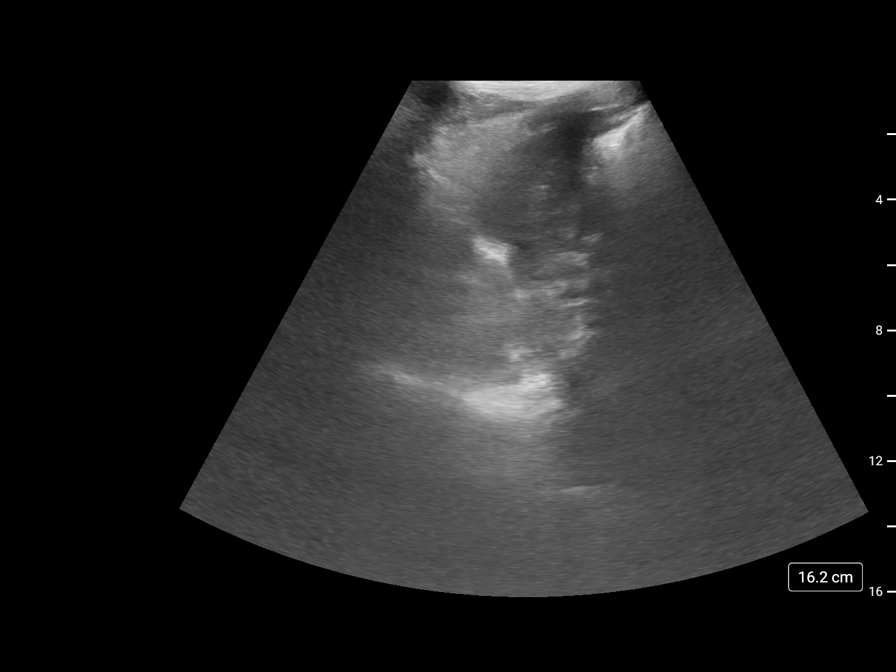
[im 5/7]
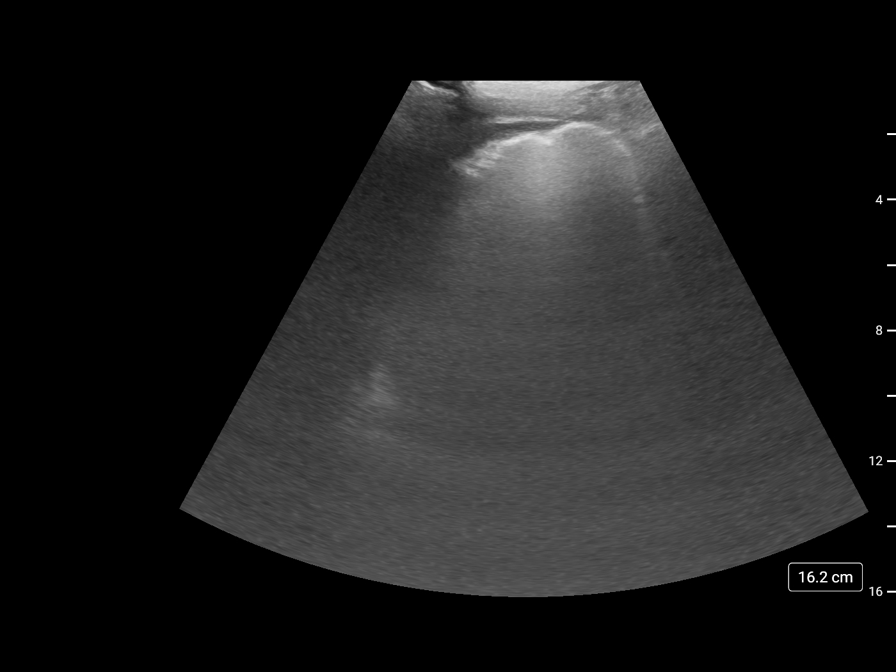
[im 6/7]
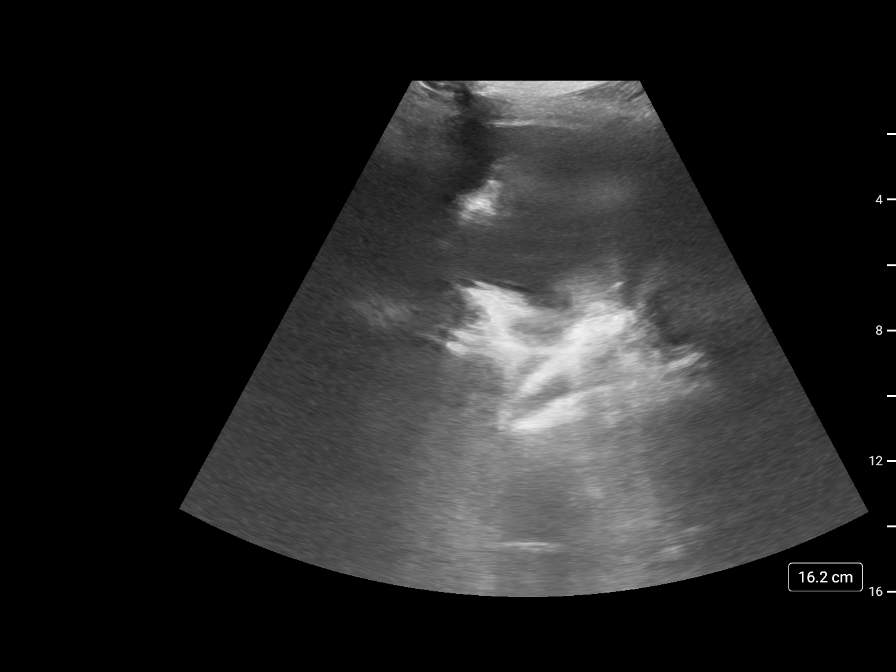
[im 7/7]
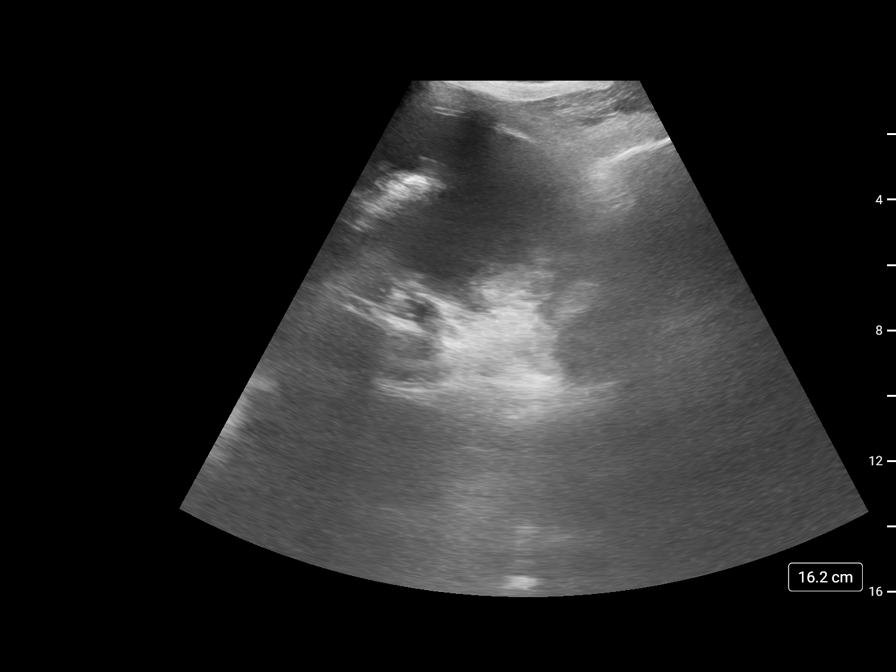

[7 of 7 positions shown; findings below may reference images not displayed]

FINDINGS: Scant pleural fluid.
IMPRESSION: Scant pleural fluid on ultrasound without safe window for
thoracentesis.

## 2020-10-18 IMAGING — DX DG CHEST 2V
2 series · 2 of 2 positions shown · non-contrast
Comparison: Chest CT and radiographs yesterday, and earlier.

CLINICAL DATA: 89-year-old male with shortness of breath. My as the
knee a gravis.

EXAM:
CHEST - 2 VIEW

[chest ap]
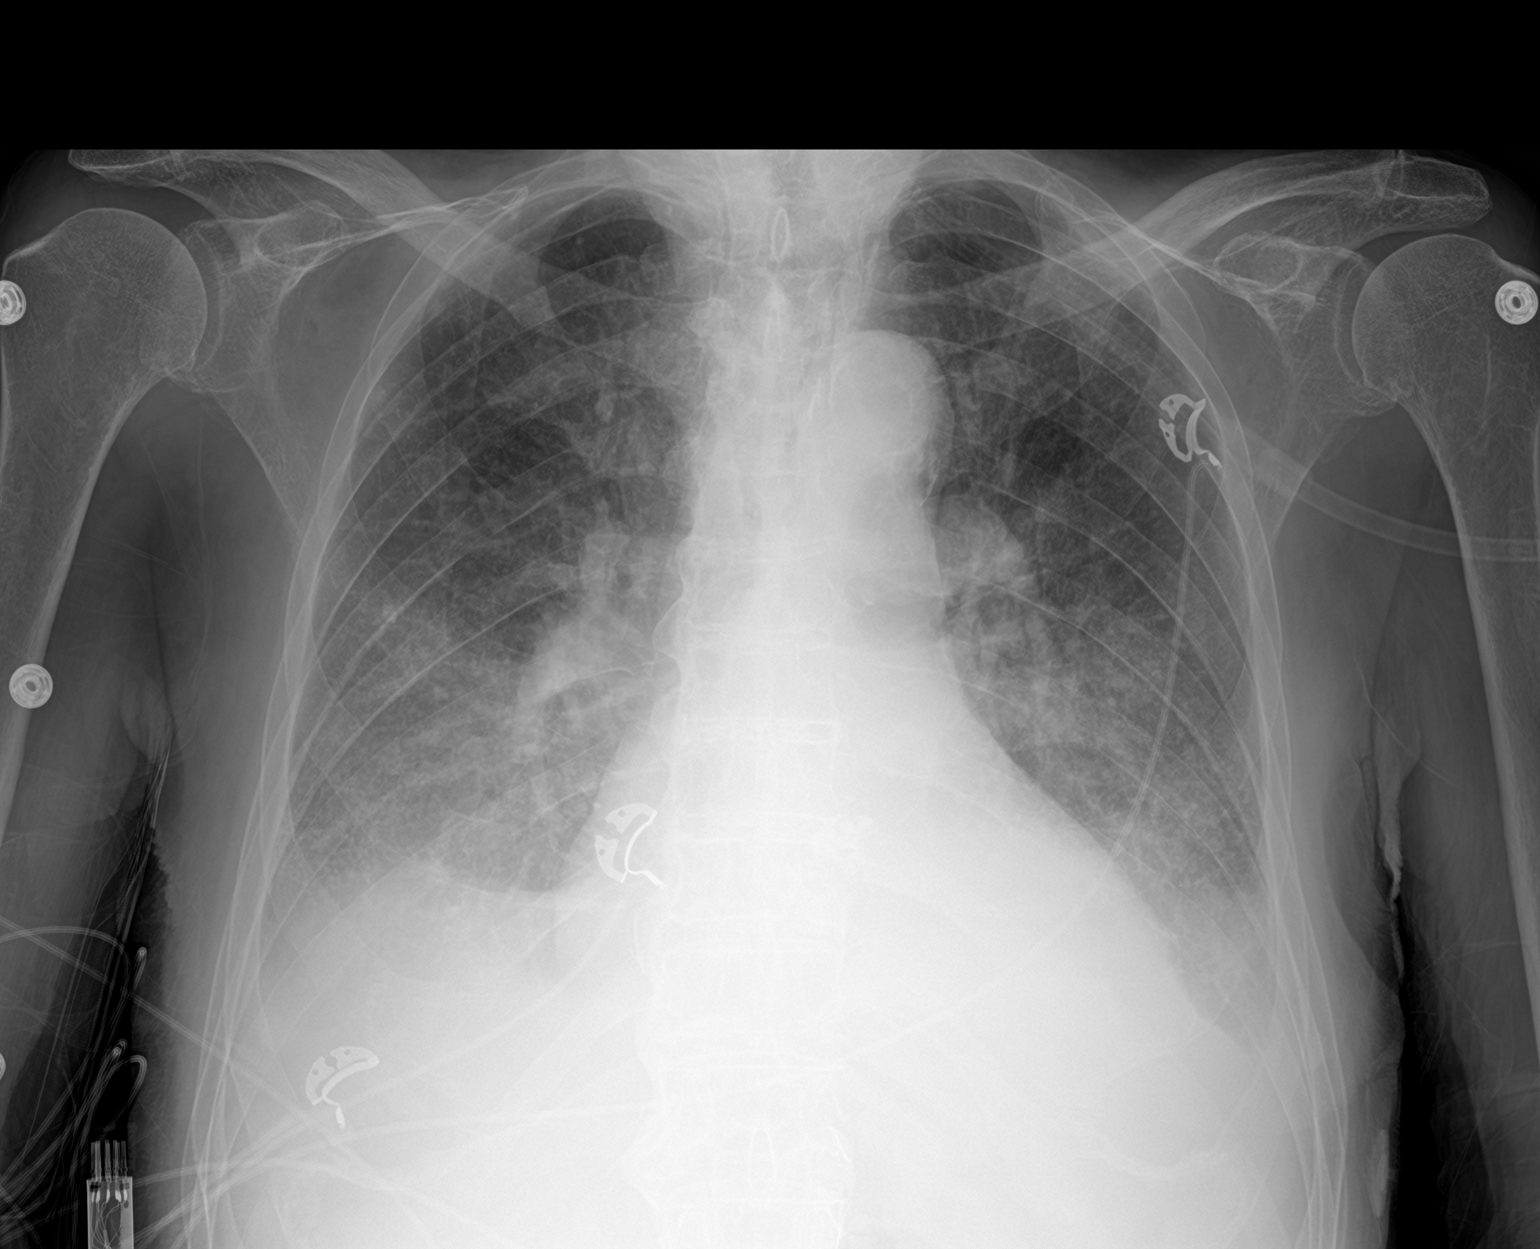

[chest lat]
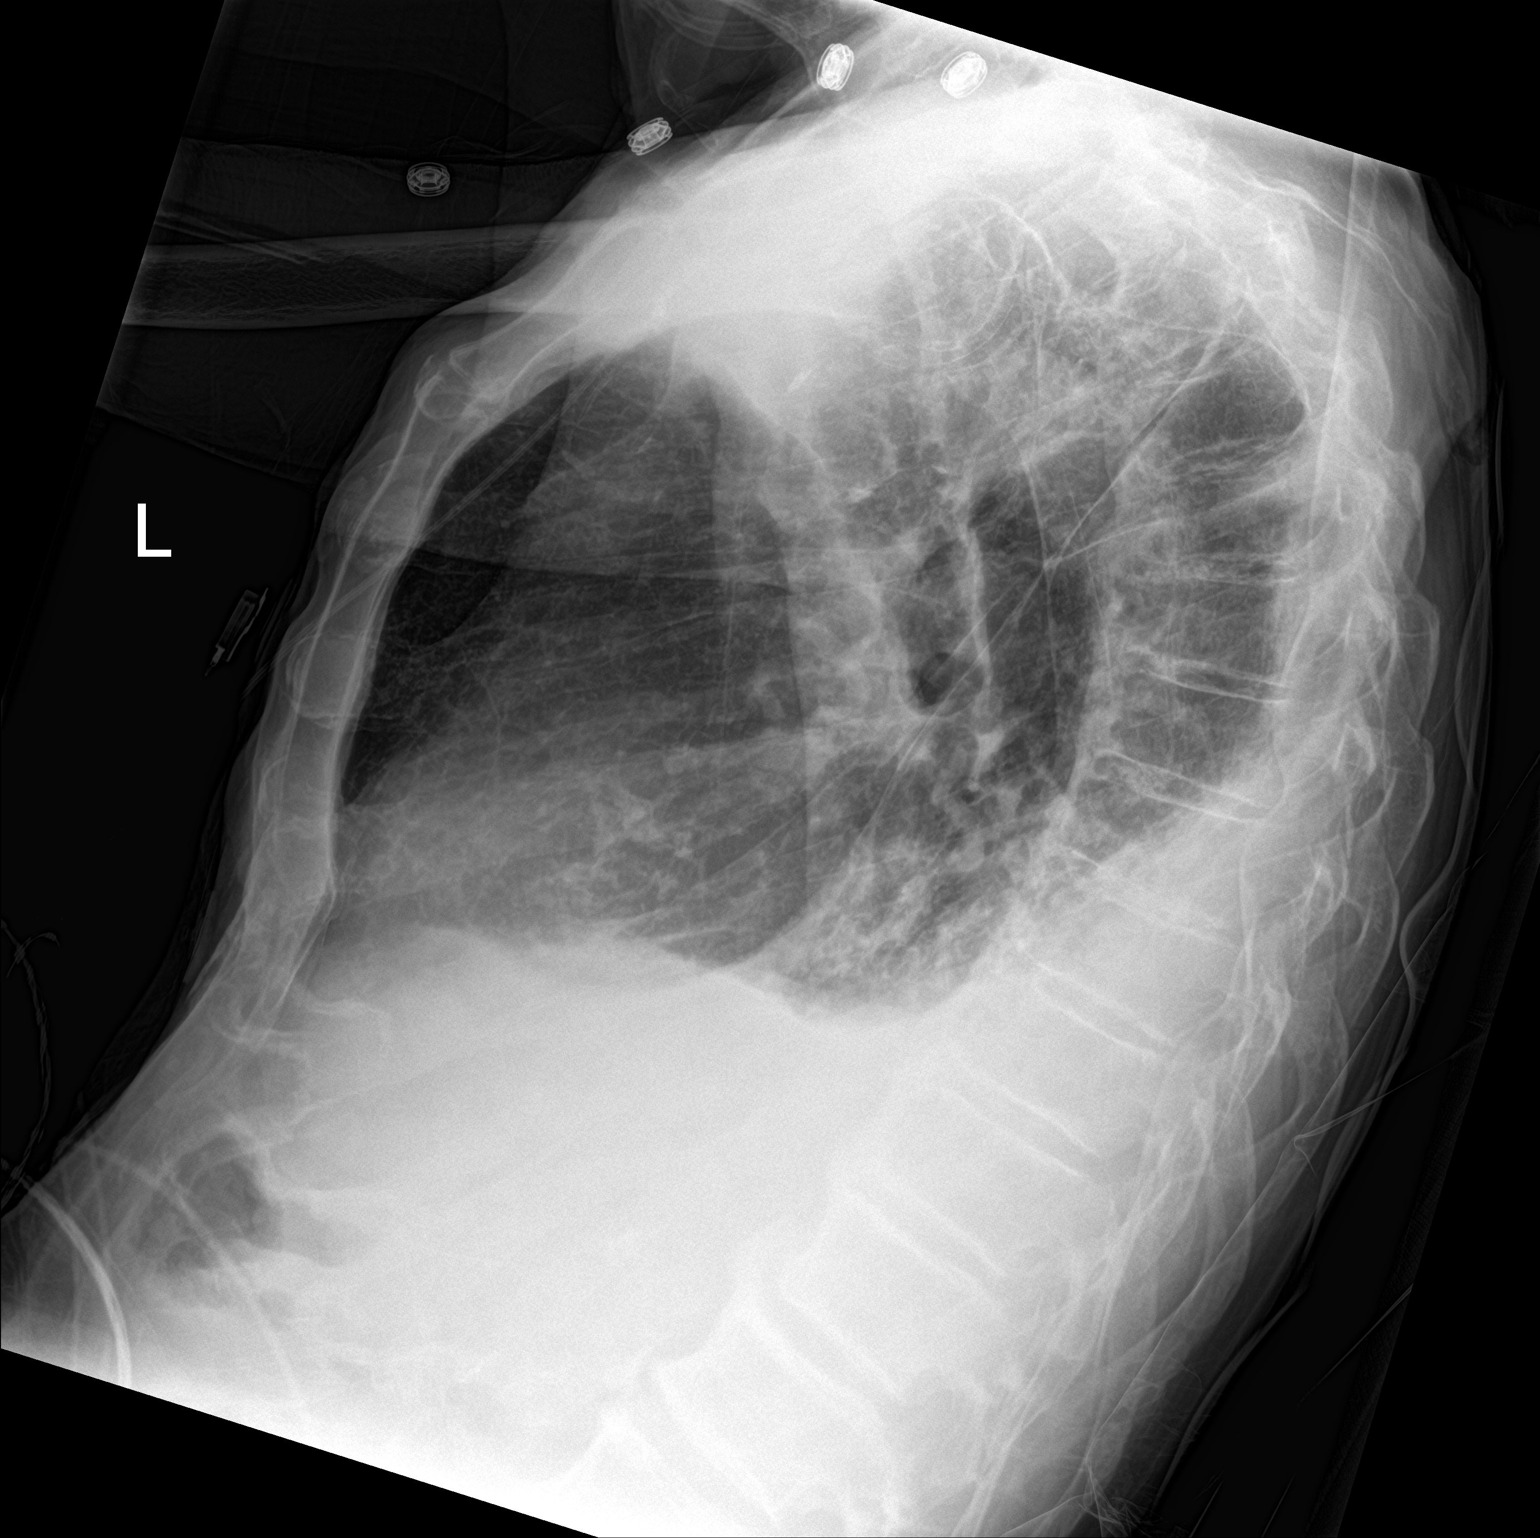

[2 of 2 positions shown; findings below may reference images not displayed]

FINDINGS: Portable AP semi upright view at 9393 hours. Mediastinal contours
remain normal. Veiling opacity in both lower lungs has increased
from the portable chest yesterday. Superimposed pulmonary vascular
congestion. No pneumothorax. No air bronchograms identified. No
acute osseous abnormality identified.
IMPRESSION: Bilateral pleural effusions and pulmonary interstitial edema.
Pleural fluid volume may be mildly increased since yesterday.
